# Patient Record
Sex: Male | Born: 1950 | Race: White | Hispanic: No | Marital: Married | Smoking: Never smoker
Health system: Southern US, Community
[De-identification: ages and names within clinical notes are randomized; demographics above are authoritative.]

## PROBLEM LIST (undated history)

## (undated) DIAGNOSIS — K743 Primary biliary cirrhosis: Secondary | ICD-10-CM

## (undated) DIAGNOSIS — E039 Hypothyroidism, unspecified: Secondary | ICD-10-CM

## (undated) DIAGNOSIS — D126 Benign neoplasm of colon, unspecified: Secondary | ICD-10-CM

## (undated) DIAGNOSIS — N281 Cyst of kidney, acquired: Secondary | ICD-10-CM

## (undated) DIAGNOSIS — E785 Hyperlipidemia, unspecified: Secondary | ICD-10-CM

## (undated) DIAGNOSIS — H269 Unspecified cataract: Secondary | ICD-10-CM

## (undated) DIAGNOSIS — C029 Malignant neoplasm of tongue, unspecified: Secondary | ICD-10-CM

## (undated) DIAGNOSIS — D509 Iron deficiency anemia, unspecified: Secondary | ICD-10-CM

## (undated) DIAGNOSIS — M5412 Radiculopathy, cervical region: Secondary | ICD-10-CM

## (undated) DIAGNOSIS — R7402 Elevation of levels of lactic acid dehydrogenase (LDH): Secondary | ICD-10-CM

## (undated) DIAGNOSIS — K7689 Other specified diseases of liver: Secondary | ICD-10-CM

## (undated) DIAGNOSIS — M5136 Other intervertebral disc degeneration, lumbar region: Secondary | ICD-10-CM

## (undated) DIAGNOSIS — D131 Benign neoplasm of stomach: Secondary | ICD-10-CM

## (undated) DIAGNOSIS — N400 Enlarged prostate without lower urinary tract symptoms: Secondary | ICD-10-CM

## (undated) DIAGNOSIS — H9192 Unspecified hearing loss, left ear: Secondary | ICD-10-CM

## (undated) DIAGNOSIS — K219 Gastro-esophageal reflux disease without esophagitis: Secondary | ICD-10-CM

## (undated) DIAGNOSIS — R7401 Elevation of levels of liver transaminase levels: Secondary | ICD-10-CM

## (undated) DIAGNOSIS — R740 Nonspecific elevation of levels of transaminase and lactic acid dehydrogenase [LDH]: Secondary | ICD-10-CM

## (undated) HISTORY — DX: Other specified diseases of liver: K76.89

## (undated) HISTORY — DX: Gastro-esophageal reflux disease without esophagitis: K21.9

## (undated) HISTORY — DX: Elevation of levels of lactic acid dehydrogenase (LDH): R74.02

## (undated) HISTORY — DX: Radiculopathy, cervical region: M54.12

## (undated) HISTORY — PX: BACK SURGERY: SHX140

## (undated) HISTORY — DX: Benign neoplasm of colon, unspecified: D12.6

## (undated) HISTORY — DX: Benign prostatic hyperplasia without lower urinary tract symptoms: N40.0

## (undated) HISTORY — DX: Benign neoplasm of stomach: D13.1

## (undated) HISTORY — DX: Other intervertebral disc degeneration, lumbar region: M51.36

## (undated) HISTORY — DX: Nonspecific elevation of levels of transaminase and lactic acid dehydrogenase (ldh): R74.0

## (undated) HISTORY — DX: Malignant neoplasm of tongue, unspecified: C02.9

## (undated) HISTORY — DX: Unspecified cataract: H26.9

## (undated) HISTORY — DX: Iron deficiency anemia, unspecified: D50.9

## (undated) HISTORY — PX: ESOPHAGOGASTRODUODENOSCOPY: SHX1529

## (undated) HISTORY — DX: Cyst of kidney, acquired: N28.1

## (undated) HISTORY — DX: Hyperlipidemia, unspecified: E78.5

## (undated) HISTORY — PX: OTHER SURGICAL HISTORY: SHX169

## (undated) HISTORY — PX: COLONOSCOPY: SHX174

## (undated) HISTORY — DX: Elevation of levels of liver transaminase levels: R74.01

## (undated) HISTORY — DX: Unspecified hearing loss, left ear: H91.92

## (undated) HISTORY — DX: Primary biliary cirrhosis: K74.3

---

## 2005-10-22 ENCOUNTER — Ambulatory Visit: Payer: Self-pay | Admitting: Cardiovascular Disease

## 2005-10-30 ENCOUNTER — Ambulatory Visit: Payer: Self-pay | Admitting: Internal Medicine

## 2005-12-10 ENCOUNTER — Ambulatory Visit (HOSPITAL_BASED_OUTPATIENT_CLINIC_OR_DEPARTMENT_OTHER): Admission: RE | Admit: 2005-12-10 | Discharge: 2005-12-10 | Payer: Self-pay | Admitting: Orthopedic Surgery

## 2005-12-10 ENCOUNTER — Encounter (INDEPENDENT_AMBULATORY_CARE_PROVIDER_SITE_OTHER): Payer: Self-pay | Admitting: Specialist

## 2006-02-26 ENCOUNTER — Encounter (INDEPENDENT_AMBULATORY_CARE_PROVIDER_SITE_OTHER): Payer: Self-pay | Admitting: *Deleted

## 2006-02-26 ENCOUNTER — Ambulatory Visit: Payer: Self-pay | Admitting: Internal Medicine

## 2006-03-27 ENCOUNTER — Ambulatory Visit: Payer: Self-pay | Admitting: Internal Medicine

## 2006-03-31 ENCOUNTER — Ambulatory Visit: Payer: Self-pay | Admitting: Internal Medicine

## 2006-12-15 ENCOUNTER — Ambulatory Visit: Payer: Self-pay | Admitting: Internal Medicine

## 2006-12-15 LAB — CONVERTED CEMR LAB
ALT: 37 units/L (ref 0–40)
AST: 41 units/L — ABNORMAL HIGH (ref 0–37)
BUN: 19 mg/dL (ref 6–23)
Calcium: 9.8 mg/dL (ref 8.4–10.5)
Chloride: 104 meq/L (ref 96–112)
GFR calc Af Amer: 89 mL/min
HCT: 44.4 % (ref 39.0–52.0)
Hemoglobin: 14.9 g/dL (ref 13.0–17.0)
MCV: 84.7 fL (ref 78.0–100.0)
Potassium: 4.1 meq/L (ref 3.5–5.1)
RDW: 13.6 % (ref 11.5–14.6)
Sodium: 140 meq/L (ref 135–145)
TSH: 1.87 microintl units/mL (ref 0.35–5.50)
VLDL: 23 mg/dL (ref 0–40)
WBC: 6 10*3/uL (ref 4.5–10.5)

## 2008-03-11 ENCOUNTER — Encounter: Payer: Self-pay | Admitting: Internal Medicine

## 2008-03-11 LAB — CONVERTED CEMR LAB
Alkaline Phosphatase: 104 units/L (ref 39–117)
Bilirubin, Direct: 0.1 mg/dL (ref 0.0–0.3)
Eosinophils Absolute: 0.2 10*3/uL (ref 0.0–0.7)
GFR calc Af Amer: 88 mL/min
GFR calc non Af Amer: 73 mL/min
HCT: 41 % (ref 39.0–52.0)
MCV: 84.3 fL (ref 78.0–100.0)
Monocytes Absolute: 0.5 10*3/uL (ref 0.1–1.0)
Monocytes Relative: 9.6 % (ref 3.0–12.0)
Platelets: 232 10*3/uL (ref 150–400)
Potassium: 4.2 meq/L (ref 3.5–5.1)
RDW: 13.6 % (ref 11.5–14.6)
Sodium: 141 meq/L (ref 135–145)

## 2008-11-16 ENCOUNTER — Ambulatory Visit: Payer: Self-pay | Admitting: Internal Medicine

## 2008-11-16 LAB — CONVERTED CEMR LAB
AST: 79 units/L — ABNORMAL HIGH (ref 0–37)
Basophils Absolute: 0 10*3/uL (ref 0.0–0.1)
Basophils Relative: 0.1 % (ref 0.0–3.0)
Chloride: 105 meq/L (ref 96–112)
Cholesterol: 134 mg/dL (ref 0–200)
Creatinine, Ser: 0.9 mg/dL (ref 0.4–1.5)
Eosinophils Absolute: 0.5 10*3/uL (ref 0.0–0.7)
GFR calc Af Amer: 111 mL/min
GFR calc non Af Amer: 92 mL/min
Ketones, ur: NEGATIVE mg/dL
LDL Cholesterol: 80 mg/dL (ref 0–99)
MCHC: 34.9 g/dL (ref 30.0–36.0)
MCV: 86.8 fL (ref 78.0–100.0)
Monocytes Absolute: 0.7 10*3/uL (ref 0.1–1.0)
Neutrophils Relative %: 64.9 % (ref 43.0–77.0)
PSA: 0.47 ng/mL (ref 0.10–4.00)
Platelets: 185 10*3/uL (ref 150–400)
RBC: 4.7 M/uL (ref 4.22–5.81)
TSH: 2.01 microintl units/mL (ref 0.35–5.50)
Total Bilirubin: 0.6 mg/dL (ref 0.3–1.2)
Triglycerides: 66 mg/dL (ref 0–149)
Urine Glucose: NEGATIVE mg/dL
Urobilinogen, UA: 0.2 (ref 0.0–1.0)
VLDL: 13 mg/dL (ref 0–40)

## 2008-11-17 DIAGNOSIS — M5412 Radiculopathy, cervical region: Secondary | ICD-10-CM | POA: Insufficient documentation

## 2008-11-17 DIAGNOSIS — R740 Nonspecific elevation of levels of transaminase and lactic acid dehydrogenase [LDH]: Secondary | ICD-10-CM

## 2008-11-17 DIAGNOSIS — K219 Gastro-esophageal reflux disease without esophagitis: Secondary | ICD-10-CM

## 2008-11-17 DIAGNOSIS — E785 Hyperlipidemia, unspecified: Secondary | ICD-10-CM | POA: Insufficient documentation

## 2008-11-17 DIAGNOSIS — R4681 Obsessive-compulsive behavior: Secondary | ICD-10-CM | POA: Insufficient documentation

## 2008-11-17 DIAGNOSIS — M47816 Spondylosis without myelopathy or radiculopathy, lumbar region: Secondary | ICD-10-CM | POA: Insufficient documentation

## 2008-11-17 DIAGNOSIS — K602 Anal fissure, unspecified: Secondary | ICD-10-CM

## 2008-11-17 DIAGNOSIS — D509 Iron deficiency anemia, unspecified: Secondary | ICD-10-CM

## 2008-11-17 DIAGNOSIS — E063 Autoimmune thyroiditis: Secondary | ICD-10-CM

## 2009-02-07 ENCOUNTER — Telehealth: Payer: Self-pay | Admitting: Internal Medicine

## 2010-04-24 ENCOUNTER — Telehealth: Payer: Self-pay | Admitting: Adult Health

## 2010-09-06 ENCOUNTER — Telehealth: Payer: Self-pay | Admitting: Internal Medicine

## 2010-11-05 ENCOUNTER — Telehealth: Payer: Self-pay | Admitting: Internal Medicine

## 2010-12-17 ENCOUNTER — Telehealth: Payer: Self-pay | Admitting: Internal Medicine

## 2010-12-18 NOTE — Progress Notes (Signed)
  Phone Note Refill Request Message from:  Fax from Pharmacy on September 06, 2010 3:09 PM  Refills Requested: Medication #1:  SONATA 10 MG  CAPS 1 by mouth at bedtime as needed Please Advise refill he [facility] [address]  Initial call taken by: Ami Bullins [name],  September 06, 2010 3:09 PM  Follow-up for Phone Call        ok for # 30 with 5 refills Follow-up by: Jacques Navy MD,  September 06, 2010 3:57 PM    Prescriptions: SONATA 10 MG  CAPS (ZALEPLON) 1 by mouth at bedtime as needed  #30 x 5   Entered by:   Ami Bullins [name]   Authorized by:   Jacques Navy MD   Signed by:   Bill Salinas [name] on 09/06/2010   Method used:   Telephoned to ...       [facility] [phone]* (retail)       [address] [place], [place]  [address]       Ph: [phone] or [phone]       Fax: [phone]   RxID:   [id number]

## 2010-12-18 NOTE — Progress Notes (Signed)
  Phone Note Call from Patient   Action Taken: Rx Called In Details for Reason: Yellow jacket sting Summary of Call: Patient called on June 5th after having 5 yellow jacket stings to his foot.  He has a  history of infected stings and inflammation requiring antibiotics and prednisone.  Keflex and prednisone called in to [facility]. Initial call taken by: Joni Reining NP

## 2010-12-20 NOTE — Progress Notes (Signed)
       New/Updated Medications: SERTRALINE HCL 50 MG TABS (SERTRALINE HCL) 1 tablet daily, with 1/2 as needed LIPITOR 10 MG TABS (ATORVASTATIN CALCIUM) 1 tablet daily Prescriptions: LIPITOR 10 MG TABS (ATORVASTATIN CALCIUM) 1 tablet daily  #90 x 3   Entered by:   Ami Bullins [name]   Authorized by:   Jacques Navy MD   Signed by:   Bill Salinas [name] on 11/05/2010   Method used:   Electronically to        [facility] [phone]* (retail)       [address] [place], [place]  [address]       Ph: [phone] or [phone]       Fax: [phone]   RxID:   [id number] SYNTHROID 112 MCG TABS (LEVOTHYROXINE SODIUM) Take 1 tablet by mouth once a day Brand medically necessary #90 x 3   Entered by:   Ami Bullins [name]   Authorized by:   Jacques Navy MD   Signed by:   Bill Salinas [name] on 11/05/2010   Method used:   Electronically to        [facility] [phone]* (retail)       [address] [place], [place]  [address]       Ph: [phone] or [phone]       Fax: [phone]   RxID:   [id number] SERTRALINE HCL 50 MG TABS (SERTRALINE HCL) 1 tablet daily, with 1/2 as needed  #120 x 3   Entered by:   Ami Bullins [name]   Authorized by:   Jacques Navy MD   Signed by:   Bill Salinas [name] on 11/05/2010   Method used:   Electronically to        [facility] [phone]* (retail)       [address] [place], [place]  [address]       Ph: [phone] or [phone]       Fax: [phone]   RxID:   [id number]

## 2010-12-26 NOTE — Progress Notes (Signed)
Summary: REQ FOR RX  Phone Note Call from Patient Call back at [phone] OR hm #   Summary of Call: Pt req rx for tussionex to help w/cough at night. [facility] [address] Initial call taken by: Lamar Sprinkles, [name],  December 17, 2010 10:37 AM  Follow-up for Phone Call        Dr Debby Bud okd Phenergan with codeine cough syrup 8 oz 1 teaspoon q 6 hours as needed  Follow-up by: Ami Bullins [name],  December 17, 2010 1:06 PM    New/Updated Medications: PROMETHAZINE-CODEINE 6.25-10 MG/5ML SYRP (PROMETHAZINE-CODEINE) 1 teaspoon q 6 hours as needed Prescriptions: PROMETHAZINE-CODEINE 6.25-10 MG/5ML SYRP (PROMETHAZINE-CODEINE) 1 teaspoon q 6 hours as needed  #8 oz x 1   Entered by:   Ami Bullins [name]   Authorized by:   Jacques Navy MD   Signed by:   Bill Salinas [name] on 12/17/2010   Method used:   Telephoned to ...       [facility] [phone]* (retail)       [address] [place], [place]  [address]       Ph: [phone] or [phone]       Fax: [phone]   RxID:   [id number]

## 2011-01-21 ENCOUNTER — Telehealth: Payer: Self-pay | Admitting: Internal Medicine

## 2011-01-23 ENCOUNTER — Encounter: Payer: Self-pay | Admitting: Internal Medicine

## 2011-01-29 NOTE — Miscellaneous (Signed)
Summary: Flagyl Rx  Per Dr Lina Sar, her and Dr Myrtis Ser have spoken. She would like him to have Flagyl 250 mg 1 tablet by mouth three times a day #15. Will send prescription to [facility]. Dottie Nelson-Smith [name] Duncan Dull)  January 23, 2011 11:34 AM  Clinical Lists Changes  Medications: Added new medication of FLAGYL 250 MG TABS (METRONIDAZOLE) Take 1 tablet by mouth three times a day x 5 days - Signed Rx of FLAGYL 250 MG TABS (METRONIDAZOLE) Take 1 tablet by mouth three times a day x 5 days;  #15 x 0;  Signed;  Entered by: Lamona Curl [name] (AAMA);  Authorized by: Hart Carwin MD;  Method used: Electronically to [facility]*, [address], [address] Shipping/mailing, [place], [place]  [address], Ph: [phone], Fax: [phone]    Prescriptions: FLAGYL 250 MG TABS (METRONIDAZOLE) Take 1 tablet by mouth three times a day x 5 days  #15 x 0   Entered by:   Lamona Curl [name] (AAMA)   Authorized by:   Hart Carwin MD   Signed by:   Lamona Curl [name] (AAMA) on 01/23/2011   Method used:   Electronically to        [facility]* (retail)       [address]       [address] Shipping/mailing       [place], [place]  [address]       Ph: [phone]       Fax: [phone]   RxID:   [id number]

## 2011-01-29 NOTE — Progress Notes (Signed)
Summary: NEW RX  Phone Note Call from Patient   Summary of Call: Pt spoke w/MD. Pt needs 2 rx's.  Initial call taken by: Lamar Sprinkles, [name],  January 21, 2011 4:03 PM  Follow-up for Phone Call        yepl thanks Follow-up by: Jacques Navy MD,  January 21, 2011 5:56 PM    New/Updated Medications: ANUSOL-HC 2.5 % CREA (HYDROCORTISONE) use as needed ANUSOL-HC 25 MG SUPP (HYDROCORTISONE ACETATE) use two times a day as directed Prescriptions: ANUSOL-HC 25 MG SUPP (HYDROCORTISONE ACETATE) use two times a day as directed  #14 x 1   Entered by:   Lamar Sprinkles, [name]   Authorized by:   Jacques Navy MD   Signed by:   Lamar Sprinkles, [name] on 01/21/2011   Method used:   Electronically to        [facility] [phone]* (retail)       [address] [place], [place]  [address]       Ph: [phone] or [phone]       Fax: [phone]   RxID:   [id number] ANUSOL-HC 2.5 % CREA (HYDROCORTISONE) use as needed  #30 x 1   Entered by:   Lamar Sprinkles, [name]   Authorized by:   Jacques Navy MD   Signed by:   Lamar Sprinkles, [name] on 01/21/2011   Method used:   Electronically to        [facility] [phone]* (retail)       [address] [place], [place]  [address]       Ph: [phone] or [phone]       Fax: [phone]   RxID:   [id number]

## 2011-04-05 NOTE — Op Note (Signed)
NAMEJOSUHA, FONTANEZ                ACCOUNT NO.:  [id number]   MEDICAL RECORD NO.:  [id number]          PATIENT TYPE:  AMB   LOCATION:  DSC                          FACILITY:  [facility]   PHYSICIAN:  Katy Fitch. Sypher, M.D. 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   POSTOP DIAGNOSIS:  Verrucous lesion of ventral nail fold.   OPERATION:  Nail plate removal and full-thickness skin biopsy of left index  nail fold with curettage of the ventral nail fold and viral culture.   OPERATING SURGEON:  Josephine Igo, M.D.   ASSISTANT:  Molly Maduro Dasnoit PA-C   ANESTHESIA:  2% lidocaine field block and metacarpal head level block of  right index finger without sedation. This was performed in the minor  operating room at the [facility].   INDICATIONS:  Raymar Joiner is a 60 year old gentleman who presented for  evaluation of chronic periungual and subungual verrucous lesion.   He had been seen by Dr. Hortense Ramal, a dermatologist and had tried a number of  treatments including 5-FU application. He had a significant predicament with  lysis of the nail plate from the ventral nail fold and a substantial  verrucous lesion measuring approximately 12 mm across and 10 mm in length.   We recommended nail plate removal, biopsy and viral culture.   After informed consent, Dr. Myrtis Ser is brought to the minor operating room at  this time.   PROCEDURE:  Travell Desaulniers is brought to the operating room and placed supine  position on the table. Following placement of metacarpal head level block,  satisfactory anesthesia of the right index finger was achieved.   The arm was prepped with Betadine soap solution, sterilely draped. The nail  plate was removed without difficulty with a Therapist, nutritional.   The lesion was carefully debrided  with a curette and rongeur removing 80% of  the bulk of the verrucous lesion. This passed off for a biopsy in a sterile  cup to be processed by the lab and a small portion will be utilized for a  viral culture.   2% lidocaine was then infiltrated deep to the lesion in the nail fold an  effort provoked an immune reaction to the verrucous lesion.   The nail plate was not replaced. The wound was dressed with Xeroflo, sterile  gauze and Coban. There no apparent complications.. Dr. Myrtis Ser was provided  additional Coban to change his dressing at home. He was cautioned not to  wrap it tightly.   He will return for follow-up evaluation in one week or sooner as needed for  problems.      Katy Fitch Sypher, M.D.  Electronically Signed     RVS/MEDQ  D:  12/10/2005  T:  12/11/2005  Job:  [id number]

## 2011-08-19 DIAGNOSIS — M5136 Other intervertebral disc degeneration, lumbar region: Secondary | ICD-10-CM

## 2011-08-19 DIAGNOSIS — M51369 Other intervertebral disc degeneration, lumbar region without mention of lumbar back pain or lower extremity pain: Secondary | ICD-10-CM

## 2011-08-19 HISTORY — DX: Other intervertebral disc degeneration, lumbar region: M51.36

## 2011-08-19 HISTORY — DX: Other intervertebral disc degeneration, lumbar region without mention of lumbar back pain or lower extremity pain: M51.369

## 2011-09-05 ENCOUNTER — Encounter: Payer: Self-pay | Admitting: Physician Assistant

## 2011-09-05 DIAGNOSIS — M549 Dorsalgia, unspecified: Secondary | ICD-10-CM

## 2011-09-05 MED ORDER — PREDNISONE 10 MG PO TABS: ORAL_TABLET | ORAL | Status: DC

## 2011-09-05 NOTE — Progress Notes (Signed)
HPI:  William Luna is a 60 y.o. male who complains of low back pain for the past week.  He was carrying some heavy items at [facility] and noted left sided discomfort.  It is worse with certain positions.  It is better in other positions.  Certain right leg movements exacerbate pain.  No loss of bowel or bladder function.  No weakness.  Course of NSAIDs was not very successful.  He has exacerbations of low back pain from time to time.     PHYSICAL EXAM: Well nourished, well developed, in no acute distress HEENT: normal MSK:  BLE strength is normal and equal Ext: no edema Skin: warm and dry Neuro:  CNs 2-12 intact, no focal abnormalities noted; patellar and achilles DTRs 2+ bilaterally   ASSESSMENT AND PLAN:  1.  Low Back Pain - Suspect soft tissue injury.  Has some radiation of pain to buttocks but no clear radicular pattern.  No weakness on exam.  Course of NSAIDs unsuccessful.  Will try prednisone taper.  If no resolution, will likely need imaging of back.  Recommend trial of PT once acute injury resolved as he has occasional episodes of back pain.  He will consider this.

## 2011-09-12 ENCOUNTER — Ambulatory Visit
Admission: RE | Admit: 2011-09-12 | Discharge: 2011-09-12 | Disposition: A | Discharge status: Home / Self Care | Payer: Commercial Managed Care - PPO | Source: Ambulatory Visit | Attending: Neurology | Admitting: Neurology

## 2011-09-12 ENCOUNTER — Other Ambulatory Visit: Payer: Self-pay | Admitting: Neurology

## 2011-09-12 DIAGNOSIS — M545 Low back pain: Secondary | ICD-10-CM

## 2011-09-12 MED ORDER — IOHEXOL 180 MG/ML  SOLN
2.0000 mL | Freq: Once | INTRAMUSCULAR | Status: AC | PRN
  Administered 2011-09-12: 2 mL via EPIDURAL

## 2011-09-12 MED ORDER — METHYLPREDNISOLONE ACETATE 40 MG/ML INJ SUSP (RADIOLOG
120.0000 mg | Freq: Once | INTRAMUSCULAR | Status: AC
  Administered 2011-09-12: 120 mg via EPIDURAL

## 2011-09-12 NOTE — Patient Instructions (Signed)

## 2011-09-24 ENCOUNTER — Other Ambulatory Visit: Payer: Self-pay | Admitting: *Deleted

## 2011-09-24 MED ORDER — SERTRALINE HCL 50 MG PO TABS: 50.0000 mg | ORAL_TABLET | Freq: Every day | ORAL | Status: DC

## 2011-09-25 ENCOUNTER — Other Ambulatory Visit: Payer: Self-pay | Admitting: *Deleted

## 2011-09-25 MED ORDER — SERTRALINE HCL 50 MG PO TABS: 50.0000 mg | ORAL_TABLET | Freq: Every day | ORAL | Status: DC

## 2011-10-17 ENCOUNTER — Other Ambulatory Visit: Payer: Self-pay | Admitting: Oral Surgery

## 2011-10-23 ENCOUNTER — Other Ambulatory Visit: Payer: Self-pay | Admitting: Otolaryngology

## 2011-10-23 DIAGNOSIS — C801 Malignant (primary) neoplasm, unspecified: Secondary | ICD-10-CM

## 2011-10-24 ENCOUNTER — Ambulatory Visit
Admission: RE | Admit: 2011-10-24 | Discharge: 2011-10-24 | Disposition: A | Discharge status: Home / Self Care | Payer: 59 | Source: Ambulatory Visit | Attending: Otolaryngology | Admitting: Otolaryngology

## 2011-10-24 ENCOUNTER — Encounter (HOSPITAL_COMMUNITY): Payer: Self-pay

## 2011-10-24 ENCOUNTER — Other Ambulatory Visit (HOSPITAL_COMMUNITY): Payer: Self-pay | Admitting: Otolaryngology

## 2011-10-24 ENCOUNTER — Encounter (HOSPITAL_COMMUNITY)
Admission: RE | Admit: 2011-10-24 | Discharge: 2011-10-24 | Disposition: A | Discharge status: Home / Self Care | Payer: 59 | Source: Ambulatory Visit | Attending: Otolaryngology | Admitting: Otolaryngology

## 2011-10-24 DIAGNOSIS — C801 Malignant (primary) neoplasm, unspecified: Secondary | ICD-10-CM

## 2011-10-24 HISTORY — DX: Hypothyroidism, unspecified: E03.9

## 2011-10-24 LAB — BASIC METABOLIC PANEL
BUN: 19 mg/dL (ref 6–23)
CO2: 29 mEq/L (ref 19–32)
Calcium: 9.8 mg/dL (ref 8.4–10.5)
GFR calc non Af Amer: 72 mL/min — ABNORMAL LOW (ref >90)
Glucose, Bld: 92 mg/dL (ref 70–99)

## 2011-10-24 LAB — CBC
HCT: 44.8 % (ref 39.0–52.0)
Hemoglobin: 14.8 g/dL (ref 13.0–17.0)
MCH: 29 pg (ref 26.0–34.0)
MCHC: 33 g/dL (ref 30.0–36.0)
MCV: 87.7 fL (ref 78.0–100.0)

## 2011-10-24 MED ORDER — IOHEXOL 300 MG/ML  SOLN
75.0000 mL | Freq: Once | INTRAMUSCULAR | Status: AC | PRN
  Administered 2011-10-24: 75 mL via INTRAVENOUS

## 2011-10-24 NOTE — Pre-Procedure Instructions (Signed)
[name]  10/24/2011   Your procedure is scheduled on:  10/30/11  Report to [facility] at 945 AM.  Call this number if you have problems the morning of surgery: [phone]   Remember:   Do not eat food:After Midnight.  May have clear liquids: up to 4 Hours before arrival.  Clear liquids include soda, tea, black coffee, apple or grape juice, broth.  Take these medicines the morning of surgery with A SIP OF WATER: SYNTHROID,ZOLOFT   Do not wear jewelry, make-up or nail polish.  Do not wear lotions, powders, or perfumes. You may wear deodorant.  Do not shave 48 hours prior to surgery.  Do not bring valuables to the hospital.  Contacts, dentures or bridgework may not be worn into surgery.  Leave suitcase in the car. After surgery it may be brought to your room.  For patients admitted to the hospital, checkout time is 11:00 AM the day of discharge.   Patients discharged the day of surgery will not be allowed to drive home.  Name and phone number of your driver: FAMILY  Special Instructions: CHG Shower Use Special Wash: 1/2 bottle night before surgery and 1/2 bottle morning of surgery.   Please read over the following fact sheets that you were given: MRSA Information and Surgical Site Infection Prevention

## 2011-10-25 ENCOUNTER — Telehealth: Payer: Self-pay | Admitting: Oncology

## 2011-10-25 ENCOUNTER — Other Ambulatory Visit: Payer: Self-pay | Admitting: Oncology

## 2011-10-25 ENCOUNTER — Other Ambulatory Visit: Payer: Self-pay | Admitting: Internal Medicine

## 2011-10-25 ENCOUNTER — Other Ambulatory Visit (HOSPITAL_COMMUNITY): Payer: 59

## 2011-10-25 DIAGNOSIS — R591 Generalized enlarged lymph nodes: Secondary | ICD-10-CM

## 2011-10-25 DIAGNOSIS — C01 Malignant neoplasm of base of tongue: Secondary | ICD-10-CM

## 2011-10-25 NOTE — Telephone Encounter (Signed)
Added new pt appt for 12/11 @ 4:15 pm for lb and 4:45 pm for [facility] Pt has already been contacted by Amy M. Re both appts. Pt contacted by Amy per GM's request.

## 2011-10-28 ENCOUNTER — Encounter (HOSPITAL_COMMUNITY)
Admission: RE | Admit: 2011-10-28 | Discharge: 2011-10-28 | Disposition: A | Discharge status: Home / Self Care | Payer: 59 | Source: Ambulatory Visit | Attending: Oncology | Admitting: Oncology

## 2011-10-28 ENCOUNTER — Encounter: Payer: Self-pay | Admitting: Oncology

## 2011-10-28 ENCOUNTER — Encounter (HOSPITAL_COMMUNITY)
Admission: RE | Admit: 2011-10-28 | Discharge: 2011-10-28 | Disposition: A | Discharge status: Home / Self Care | Payer: 59 | Source: Ambulatory Visit | Attending: Internal Medicine | Admitting: Internal Medicine

## 2011-10-28 ENCOUNTER — Telehealth: Payer: Self-pay | Admitting: Oncology

## 2011-10-28 DIAGNOSIS — N200 Calculus of kidney: Secondary | ICD-10-CM | POA: Insufficient documentation

## 2011-10-28 DIAGNOSIS — N281 Cyst of kidney, acquired: Secondary | ICD-10-CM | POA: Insufficient documentation

## 2011-10-28 DIAGNOSIS — R591 Generalized enlarged lymph nodes: Secondary | ICD-10-CM

## 2011-10-28 DIAGNOSIS — C021 Malignant neoplasm of border of tongue: Secondary | ICD-10-CM | POA: Insufficient documentation

## 2011-10-28 DIAGNOSIS — K7689 Other specified diseases of liver: Secondary | ICD-10-CM | POA: Insufficient documentation

## 2011-10-28 DIAGNOSIS — C01 Malignant neoplasm of base of tongue: Secondary | ICD-10-CM

## 2011-10-28 MED ORDER — FLUDEOXYGLUCOSE F - 18 (FDG) INJECTION: 18.3000 | Freq: Once | INTRAVENOUS | Status: DC | PRN

## 2011-10-28 NOTE — Telephone Encounter (Signed)
CHART DELIVERED °

## 2011-10-29 ENCOUNTER — Ambulatory Visit (HOSPITAL_BASED_OUTPATIENT_CLINIC_OR_DEPARTMENT_OTHER): Payer: 59

## 2011-10-29 ENCOUNTER — Ambulatory Visit (HOSPITAL_BASED_OUTPATIENT_CLINIC_OR_DEPARTMENT_OTHER): Payer: 59 | Admitting: Oncology

## 2011-10-29 ENCOUNTER — Other Ambulatory Visit: Payer: 59 | Admitting: Lab

## 2011-10-29 ENCOUNTER — Encounter: Payer: Self-pay | Admitting: Oncology

## 2011-10-29 VITALS — BP 131/75 | HR 66 | Temp 97.0°F | Ht 68.0 in | Wt 162.4 lb

## 2011-10-29 DIAGNOSIS — C029 Malignant neoplasm of tongue, unspecified: Secondary | ICD-10-CM

## 2011-10-29 NOTE — Progress Notes (Signed)
[facility] - INITIAL CONSULATION  Referral MD:  [facility], M.D.   Reason for Referral: newly diagnosed right lateral tongue squamous cell carcinoma.    HPI:  [facility] Luna is a 60 yo man with no history of smoking or heavy EtOH use.  He was in usual state of health until October-November 2012 when he noticed a tingling sensation when he swallowed in the right lateral tongue.  At first, he could not palpate any mass.  However, by mid November 2012, he could feel a small 3-84mm mass in the area.  He presented to an oral surgeon [facility] who performed a "shave biopsy" per [facility] description on 10/17/2011.  Pathology case # [id number] was notable for squamous cell carcinoma, most likely invasive; positive margin.  He was referred to [facility] Luna who recommended primary resection.  [facility] Luna was kindly referred to the [facility] for medical oncology evaluation.  He presented to the clinic for the first time today with his wife.  He still has similar burning sensation in the right lateral tongue when he swallows.  After the biopsy, he no longer could feel any mass.  He denies any palpable bilateral neck nodes.  He denies recent sinus infection, dental abscess.    Patient denies fatigue, headache, visual changes, confusion, drenching night sweats, odynophagia, dysphagia, nausea vomiting, jaundice, chest pain, palpitation, shortness of breath, dyspnea on exertion, productive cough, gum bleeding, epistaxis, hematemesis, hemoptysis, abdominal pain, abdominal swelling, early satiety, melena, hematochezia, hematuria, skin rash, spontaneous bleeding, heat or cold intolerance, bowel bladder incontinence, back pain, focal motor weakness, paresthesia, depression, suicidal or homocidal ideation, feeling hopelessness.  He has left hip pain from osteoarthritis which required steroid injection recently.  He had mild left leg weakness due to the pain; which has improved almost to normal after the  recent injection.      Past Medical History  Diagnosis Date  . Hypothyroidism   . Anxiety   . HYPERLIPIDEMIA, MILD 11/17/2008  . ANEMIA, IRON DEFICIENCY 11/17/2008  . GERD 11/17/2008  . 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 CERVICAL RADICULOPATHY 11/17/2008  . TRANSAMINASES, SERUM, ELEVATED 11/17/2008  :  Past Surgical History  Procedure Date  . Back surgery     CERVICAL DISC   Left ear hearing loss, age related.    Current outpatient prescriptions:aspirin EC 81 MG tablet, Take 81 mg by mouth daily.  , Disp: , Rfl: ;  atorvastatin (LIPITOR) 10 MG tablet, Take 10 mg by mouth daily.  , Disp: , Rfl: ;  levothyroxine (SYNTHROID, LEVOTHROID) 112 MCG tablet, Take 112 mcg by mouth daily.  , Disp: , Rfl: ;  sertraline (ZOLOFT) 50 MG tablet, Take 50-75 mg by mouth daily. Pt alternates between 50 mg (1 tab) one day and 75 mg (1 1/2 tabs) the next day , Disp: , Rfl:  predniSONE (DELTASONE) 10 MG tablet, Take 6 tabs for 3 days, then 4 tabs for 4 days, then 2 tabs for 4 days, then 1 tab for 3 days, then stop, Disp: 45 tablet, Rfl: 0 No current facility-administered medications for this visit. Facility-Administered Medications Ordered in Other Visits: fludeoxyglucose F - 18 (FDG) injection 18.3 milli Curie, 18.3 milli Curie, Intravenous, Once PRN, Medication Radiologist:    :  No Known Allergies:  History reviewed. No pertinent family history.:  History   Social History  . Marital Status: Married    Spouse Name: N/A    Number of Children: 2  . 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 Smoking status: Never Smoker   . Smokeless tobacco: Never Used  . Alcohol Use: Yes     OCC.  . Drug Use: No  . Sexually Active: Yes    Birth Control/ Protection: None   Other Topics Concern  . Not on file   Social History Narrative  . No narrative on file    Pertinent items are noted in  HPI.  Exam:  General:  well-nourished in no acute distress.  Eyes:  no scleral icterus.  ENT:  There were no oropharyngeal lesions.  I could visualize a white scar from recent biopsy of the right lateral tongue.  However, I could not palpate any tongue mass.  Neck was without thyromegaly.  Lymphatics:  Negative cervical, supraclavicular or axillary adenopathy.  Respiratory: lungs were clear bilaterally without wheezing or crackles.  Cardiovascular:  Regular rate and rhythm, S1/S2, without murmur, rub or gallop.  There was no pedal edema.  GI:  abdomen was soft, flat, nontender, nondistended, without organomegaly.  Muscoloskeletal:  no spinal tenderness of palpation of vertebral spine.  Skin exam was without echymosis, petichae.  Neuro exam was nonfocal.  Patient was able to get on and off exam table without assistance.  Gait was normal.  Patient was alerted and oriented.  Attention was good.   Language was appropriate.  Mood was normal without depression.  Speech was not pressured.  Thought content was not tangential.     LABS:  Recent CBC; CMET reviewed and were within normal range.   PATHOLOGY:  From right lateral tongue border; biopsy 10/17/2011:  Invasive squamous cell carcinoma; positive margin.   IMAGING:  I personally reviewed the following CT and PET scan and showed the pictures to [facility] and his wife.  In brief, there was no tongue mass.  On CT, there was 2 shotty right cervical levels II/III nodes about 14 mm.  There was a smaller node in the left cervical neck.   These nodes were not active on PET scan.   There was no evidence of metastatic disease on PET.   Ct Soft Tissue Neck W Contrast  10/24/2011  *RADIOLOGY REPORT*  Clinical Data: New diagnosis of tongue cancer along the right lateral border.  Squamous cell carcinoma.  199.1.  CT NECK WITH CONTRAST  Technique:  Multidetector CT imaging of the neck was performed with intravenous contrast.  Contrast: 75mL OMNIPAQUE IOHEXOL 300 MG/ML IV  SOLN  Comparison: None.  Findings: The primary site of tumor may be obscured by adjacent dental artifact.  No discrete lesion is evident.  The intrinsic tongue muscles are intact.  Fat planes are preserved.  There is no evidence for deep invasion.  A right level II lymph node is somewhat rounded, measuring 14 x 9 mm.  An adjacent node measures 15 x 9 mm.  A left level II lymph node measures 14 mm, but is more ovoid.  There is a small node near the carotid bifurcation on the left as well.  No other pathologically enlarged nodes are evident.  Lung apices are clear.  The bone windows reveal anterior cervical fusion at C5-6 and C6-7.  The posterior elements are fused as well. Uncovertebral spurring is present C3-4 C4-5 with left greater than right to osseous foraminal stenosis.  IMPRESSION:  1.  The primary tongue lesion is not visualized.  It may be obscured by dental artifact.  There is no evidence for deep invasion of the intrinsic tongue muscles. 2.  There are two enlarged right level II cervical lymph nodes which are somewhat worrisome for potential metastases.  More benign- appearing nodes are present on the left. 3.  Postsurgical changes of the cervical spine. 4.  Mild spondylosis at C3-4 and C4-5 as described.  Original Report Authenticated By: Jamesetta Orleans. MATTERN, M.D.   Nm Pet Image Initial (pi) Skull Base To Thigh  10/28/2011  *RADIOLOGY REPORT*  Clinical Data: Initial treatment strategy for head neck cancer. Lateral tongue carcinoma.  NUCLEAR MEDICINE PET SKULL BASE TO THIGH  Fasting Blood Glucose:  82  Technique:  18.3 mCi F-18 FDG was injected intravenously.   CT data was obtained and used for attenuation correction and anatomic localization only.  (This was not acquired as a diagnostic CT examination.) Additional exam technical data entered  on technologist worksheet.  Right arm injection.  Comparison: Head CT 10/24/2011  Findings:  Neck: There is no asymmetric activity within the tongue to  localize the primary carcinoma.  The right  lateral neck level II lymph nodes of concern on prior CT measure 6 mm short axis and 7 mm short axis (images 38 and 34 respectively)  on dedicated neck series (series 2).  These nodes have low metabolic activity ( SUV max = 2.5) which is similar to background blood pool activity.  This is felt to represent inflammatory or reactive activity rather than neoplastic.  No additional hypermetabolic nodes in the neck.  Chest: No hypermetabolic mediastinal or hilar nodes.  No suspicious pulmonary nodules.  A low density lesion in the liver has  simple fluid attenuation and no associated metabolic activity.  There is a tiny 2 mm calculus within the right kidney which is nonobstructive. Low density cyst within the left kidney is simple fluid attenuation.  Abdomen / Pelvis:No abnormal hypermetabolic activity within the solid organs.  No evidence of abdominal or pelvic hypermetabolic nodes.  Skeleton:No focal hypermetabolic activity to suggest skeletal metastasis.  IMPRESSION:  1.  No evidence of local head neck cancer nodal metastasis.  2.   Level II lymph nodes on the right have low metabolic activity which is felt to be reactive. 3.  No evidence  of  distal metastasis. 4.  Incidental finding including the hepatic cyst, renal cyst, and small right renal calculus.  Findings discussed  [facility] on 10/28/2011 at 1445 hours  Original Report Authenticated By: [name], M.D.    Assessment and Plan:  cT1 Nx M0 right lateral tongue SCC.  I discussed the following plan with [facility] over the phone today.   I discussed with [facility] and his wife that there is no way to no for sure short of neck dissection to see if he has nodal involvement if the neck.  The CT of the neck did show some shotty right more than left cervical adenopathy.  PET scan did not show uptake of these nodes; however, there is a limit of detection when the nodes are still small.  There are two options.  The aggressive option is to assume that the nodes are positive.  In this case, the curative option is primary oncologic resection with wide margin of the right lateral tongue mass along with right neck dissection.  If he has high risk features such as positive margin; or if the nodes are indeed positive and they have extracapsular extension, then he would benefit from adjuvant chemoradiation to improve local/regional control and  ultimately disease-free survival and overall survival.  If the nodes are positive without high risk features, then he would only need adjuvant radiation.  [facility] verbally expressed his understanding of the low but potential side effects of neck dissection which were discussed to him by [facility] Luna which include but not limited to selective cranial nerve weakness, scar formation, neck stiffness.   The conservative option is to only perform primary oncologic resection and skip the neck dissection for now.  He then would undergo watchful observation with follow up neck CT in the near future within 3-6 months.  If there is cervical node enlargement either clinically or radiographicaly, then, neck dissection will be considered at that time.  The advantage of this is to avoid the potential side effects of neck dissection.  The potential draw back is the low chance of distant met at the same time the cervical nodes clinically declares itself.   At this time, [facility] explicitly verbalized his opinion that "he would like to do the aggressive measure(s) that is/are within guideline recommendations."  I showed him and his wife NCCN guideline for oral SCC.  If we assume that he has cT1 N0 M0 disease, the recommendation for neck dissection is +/-.  This means that the strength of the recommendation for neck dissection is not as strong compared to cases with clinically cervical node positive; but it would still be reasonable to do so.   William Luna would like to think this over tonight and decide with  [facility] Luna tomorrow preoperatively.  His final path will be presented at the next head/neck tumor board on 11/06/11.  He was not given follow up appointment with me unless he turns out to require adjuvant chemotherapy.  I advised to follow up with [facility] Luna closely especially if he decides to follow the cervical nodes clinically instead of upfront neck dissection.  The length of time of the face-to-face encounter was 60  minutes. More than 50% of time was spent counseling and coordination of care.

## 2011-10-30 ENCOUNTER — Ambulatory Visit (HOSPITAL_COMMUNITY)
Admission: RE | Admit: 2011-10-30 | Discharge: 2011-10-31 | Disposition: A | Discharge status: Home / Self Care | Payer: 59 | Source: Ambulatory Visit | Attending: Otolaryngology | Admitting: Otolaryngology

## 2011-10-30 ENCOUNTER — Ambulatory Visit (HOSPITAL_COMMUNITY): Payer: 59 | Admitting: Anesthesiology

## 2011-10-30 ENCOUNTER — Other Ambulatory Visit: Payer: Self-pay | Admitting: Otolaryngology

## 2011-10-30 ENCOUNTER — Other Ambulatory Visit: Payer: Self-pay

## 2011-10-30 ENCOUNTER — Encounter (HOSPITAL_COMMUNITY): Payer: Self-pay | Admitting: Anesthesiology

## 2011-10-30 ENCOUNTER — Encounter (HOSPITAL_COMMUNITY)
Admission: RE | Disposition: A | Discharge status: Home / Self Care | Payer: Self-pay | Source: Ambulatory Visit | Attending: Otolaryngology

## 2011-10-30 DIAGNOSIS — Z01812 Encounter for preprocedural laboratory examination: Secondary | ICD-10-CM | POA: Insufficient documentation

## 2011-10-30 DIAGNOSIS — K219 Gastro-esophageal reflux disease without esophagitis: Secondary | ICD-10-CM | POA: Insufficient documentation

## 2011-10-30 DIAGNOSIS — E039 Hypothyroidism, unspecified: Secondary | ICD-10-CM | POA: Insufficient documentation

## 2011-10-30 DIAGNOSIS — D509 Iron deficiency anemia, unspecified: Secondary | ICD-10-CM

## 2011-10-30 DIAGNOSIS — C029 Malignant neoplasm of tongue, unspecified: Secondary | ICD-10-CM | POA: Insufficient documentation

## 2011-10-30 HISTORY — PX: HEMIGLOSSECTOMY: SHX1740

## 2011-10-30 HISTORY — PX: RADICAL NECK DISSECTION: SHX2284

## 2011-10-30 SURGERY — HEMIGLOSSECTOMY
Anesthesia: General | Site: Neck | Laterality: Right | Wound class: Clean Contaminated

## 2011-10-30 MED ORDER — EPHEDRINE SULFATE 50 MG/ML IJ SOLN
INTRAMUSCULAR | Status: DC | PRN
  Administered 2011-10-30: 2.5 mg via INTRAVENOUS
  Administered 2011-10-30: 5 mg via INTRAVENOUS

## 2011-10-30 MED ORDER — CLINDAMYCIN PHOSPHATE 600 MG/50ML IV SOLN
600.0000 mg | Freq: Once | INTRAVENOUS | Status: AC
  Administered 2011-10-30: 600 mg via INTRAVENOUS
  Filled 2011-10-30: qty 50

## 2011-10-30 MED ORDER — ONDANSETRON HCL 4 MG/2ML IJ SOLN: 4.0000 mg | INTRAMUSCULAR | Status: DC | PRN

## 2011-10-30 MED ORDER — PROPOFOL 10 MG/ML IV EMUL
INTRAVENOUS | Status: DC | PRN
  Administered 2011-10-30: 150 mg via INTRAVENOUS

## 2011-10-30 MED ORDER — ONDANSETRON HCL 4 MG/2ML IJ SOLN
INTRAMUSCULAR | Status: DC | PRN
  Administered 2011-10-30: 4 mg via INTRAVENOUS

## 2011-10-30 MED ORDER — SODIUM CHLORIDE 0.9 % IR SOLN
Status: DC | PRN
  Administered 2011-10-30: 1000 mL

## 2011-10-30 MED ORDER — HYDROMORPHONE HCL PF 1 MG/ML IJ SOLN
INTRAMUSCULAR | Status: AC
  Filled 2011-10-30: qty 1

## 2011-10-30 MED ORDER — SERTRALINE HCL 50 MG PO TABS
50.0000 mg | ORAL_TABLET | ORAL | Status: DC
  Filled 2011-10-30: qty 1

## 2011-10-30 MED ORDER — LACTATED RINGERS IV SOLN
INTRAVENOUS | Status: DC | PRN
  Administered 2011-10-30 (×3): via INTRAVENOUS

## 2011-10-30 MED ORDER — MIDAZOLAM HCL 5 MG/5ML IJ SOLN
INTRAMUSCULAR | Status: DC | PRN
  Administered 2011-10-30: 2 mg via INTRAVENOUS

## 2011-10-30 MED ORDER — DEXTROSE-NACL 5-0.45 % IV SOLN
INTRAVENOUS | Status: DC
  Administered 2011-10-30: 21:00:00 via INTRAVENOUS

## 2011-10-30 MED ORDER — DEXTROSE 5 % IV SOLN
INTRAVENOUS | Status: AC
  Filled 2011-10-30: qty 50

## 2011-10-30 MED ORDER — BACITRACIN ZINC 500 UNIT/GM EX OINT
TOPICAL_OINTMENT | CUTANEOUS | Status: DC | PRN
  Administered 2011-10-30: 1 via TOPICAL

## 2011-10-30 MED ORDER — LACTATED RINGERS IV SOLN
INTRAVENOUS | Status: DC
  Administered 2011-10-30: 12:00:00 via INTRAVENOUS

## 2011-10-30 MED ORDER — SERTRALINE HCL 50 MG PO TABS: 50.0000 mg | ORAL_TABLET | Freq: Every day | ORAL | Status: DC

## 2011-10-30 MED ORDER — NEOSTIGMINE METHYLSULFATE 1 MG/ML IJ SOLN
INTRAMUSCULAR | Status: DC | PRN
  Administered 2011-10-30: 3 mg via INTRAVENOUS

## 2011-10-30 MED ORDER — LEVOTHYROXINE SODIUM 112 MCG PO TABS
112.0000 ug | ORAL_TABLET | Freq: Every day | ORAL | Status: DC
  Administered 2011-10-31: 112 ug via ORAL
  Filled 2011-10-30 (×2): qty 1

## 2011-10-30 MED ORDER — BACITRACIN ZINC 500 UNIT/GM EX OINT
1.0000 "application " | TOPICAL_OINTMENT | Freq: Three times a day (TID) | CUTANEOUS | Status: DC
  Filled 2011-10-30: qty 15

## 2011-10-30 MED ORDER — SIMVASTATIN 20 MG PO TABS
20.0000 mg | ORAL_TABLET | Freq: Every day | ORAL | Status: DC
  Filled 2011-10-30 (×2): qty 1

## 2011-10-30 MED ORDER — GLYCOPYRROLATE 0.2 MG/ML IJ SOLN
INTRAMUSCULAR | Status: DC | PRN
  Administered 2011-10-30: .4 mg via INTRAVENOUS

## 2011-10-30 MED ORDER — ACETAMINOPHEN 160 MG/5ML PO SOLN
650.0000 mg | Freq: Four times a day (QID) | ORAL | Status: DC | PRN
  Administered 2011-10-30 – 2011-10-31 (×3): 650 mg via ORAL
  Filled 2011-10-30 (×3): qty 20.3

## 2011-10-30 MED ORDER — ROCURONIUM BROMIDE 100 MG/10ML IV SOLN
INTRAVENOUS | Status: DC | PRN
  Administered 2011-10-30: 20 mg via INTRAVENOUS
  Administered 2011-10-30: 30 mg via INTRAVENOUS

## 2011-10-30 MED ORDER — CLINDAMYCIN PHOSPHATE 600 MG/50ML IV SOLN
600.0000 mg | Freq: Three times a day (TID) | INTRAVENOUS | Status: DC
  Administered 2011-10-30 – 2011-10-31 (×2): 600 mg via INTRAVENOUS
  Filled 2011-10-30 (×4): qty 50

## 2011-10-30 MED ORDER — MORPHINE SULFATE 2 MG/ML IJ SOLN: 2.0000 mg | INTRAMUSCULAR | Status: DC | PRN

## 2011-10-30 MED ORDER — FENTANYL CITRATE 0.05 MG/ML IJ SOLN
INTRAMUSCULAR | Status: DC | PRN
  Administered 2011-10-30: 50 ug via INTRAVENOUS
  Administered 2011-10-30: 250 ug via INTRAVENOUS
  Administered 2011-10-30 (×4): 50 ug via INTRAVENOUS

## 2011-10-30 MED ORDER — HYDROMORPHONE HCL PF 1 MG/ML IJ SOLN
0.2500 mg | INTRAMUSCULAR | Status: DC | PRN
  Administered 2011-10-30: 0.5 mg via INTRAVENOUS

## 2011-10-30 MED ORDER — VECURONIUM BROMIDE 10 MG IV SOLR
INTRAVENOUS | Status: DC | PRN
  Administered 2011-10-30: 2 mg via INTRAVENOUS

## 2011-10-30 MED ORDER — CLINDAMYCIN PHOSPHATE 600 MG/4ML IJ SOLN
INTRAMUSCULAR | Status: AC
  Filled 2011-10-30: qty 4

## 2011-10-30 MED ORDER — SERTRALINE HCL 50 MG PO TABS
75.0000 mg | ORAL_TABLET | ORAL | Status: DC
  Filled 2011-10-30: qty 1

## 2011-10-30 MED ORDER — PROMETHAZINE HCL 25 MG/ML IJ SOLN
6.2500 mg | INTRAMUSCULAR | Status: DC | PRN
  Administered 2011-10-30: 6.25 mg via INTRAVENOUS

## 2011-10-30 MED ORDER — OXYCODONE-ACETAMINOPHEN 5-325 MG/5ML PO SOLN: 5.0000 mL | ORAL | Status: DC | PRN

## 2011-10-30 MED ORDER — ONDANSETRON HCL 4 MG PO TABS: 4.0000 mg | ORAL_TABLET | ORAL | Status: DC | PRN

## 2011-10-30 SURGICAL SUPPLY — 59 items
ATTRACTOMAT 16X20 MAGNETIC DRP (DRAPES) IMPLANT
BACITRACIN OINTMENT ×3 IMPLANT
BLADE SURG 15 STRL LF DISP TIS (BLADE) IMPLANT
BLADE SURG 15 STRL SS (BLADE)
CANISTER SUCTION 2500CC (MISCELLANEOUS) ×3 IMPLANT
CLEANER TIP ELECTROSURG 2X2 (MISCELLANEOUS) ×6 IMPLANT
CLOTH BEACON ORANGE TIMEOUT ST (SAFETY) ×3 IMPLANT
CONT SPEC 4OZ CLIKSEAL STRL BL (MISCELLANEOUS) ×3 IMPLANT
COVER SURGICAL LIGHT HANDLE (MISCELLANEOUS) ×6 IMPLANT
COVER TABLE BACK 60X90 (DRAPES) IMPLANT
CRADLE DONUT ADULT HEAD (MISCELLANEOUS) IMPLANT
DRAIN CHANNEL 15F RND FF W/TCR (WOUND CARE) IMPLANT
DRAIN HEMOVAC 1/8 X 5 (WOUND CARE) ×3 IMPLANT
ELECT COATED BLADE 2.86 ST (ELECTRODE) ×6 IMPLANT
ELECT REM PT RETURN 9FT ADLT (ELECTROSURGICAL) ×3
ELECTRODE REM PT RTRN 9FT ADLT (ELECTROSURGICAL) ×2 IMPLANT
EVACUATOR SILICONE 100CC (DRAIN) ×6 IMPLANT
GAUZE SPONGE 4X4 16PLY XRAY LF (GAUZE/BANDAGES/DRESSINGS) ×3 IMPLANT
GLOVE BIOGEL PI IND STRL 7.0 (GLOVE) ×4 IMPLANT
GLOVE BIOGEL PI INDICATOR 7.0 (GLOVE) ×2
GLOVE ECLIPSE 7.5 STRL STRAW (GLOVE) ×6 IMPLANT
GLOVE SS BIOGEL STRL SZ 6.5 (GLOVE) ×2 IMPLANT
GLOVE SS BIOGEL STRL SZ 7.5 (GLOVE) ×4 IMPLANT
GLOVE SUPERSENSE BIOGEL SZ 6.5 (GLOVE) ×1
GLOVE SUPERSENSE BIOGEL SZ 7.5 (GLOVE) ×2
GOWN STRL NON-REIN LRG LVL3 (GOWN DISPOSABLE) ×3 IMPLANT
GUARD TEETH (MISCELLANEOUS) IMPLANT
KIT BASIN OR (CUSTOM PROCEDURE TRAY) ×3 IMPLANT
KIT ROOM TURNOVER OR (KITS) ×3 IMPLANT
LOCATOR NERVE 3 VOLT (DISPOSABLE) ×3 IMPLANT
NS IRRIG 1000ML POUR BTL (IV SOLUTION) ×3 IMPLANT
PAD ARMBOARD 7.5X6 YLW CONV (MISCELLANEOUS) ×6 IMPLANT
PENCIL FOOT CONTROL (ELECTRODE) ×3 IMPLANT
SPECIMEN JAR MEDIUM (MISCELLANEOUS) IMPLANT
SPECIMEN JAR SMALL (MISCELLANEOUS) IMPLANT
SPONGE INTESTINAL PEANUT (DISPOSABLE) IMPLANT
SPONGE LAP 18X18 X RAY DECT (DISPOSABLE) ×3 IMPLANT
STAPLER VISISTAT 35W (STAPLE) ×3 IMPLANT
SURGILUBE 2OZ TUBE FLIPTOP (MISCELLANEOUS) IMPLANT
SUT CHROMIC 3 0 SH 27 (SUTURE) ×3 IMPLANT
SUT CHROMIC 5 0 P 3 (SUTURE) IMPLANT
SUT ETHILON 3 0 PS 1 (SUTURE) ×3 IMPLANT
SUT ETHILON 5 0 PS 2 18 (SUTURE) IMPLANT
SUT SILK 2 0 (SUTURE) ×1
SUT SILK 2 0 SH CR/8 (SUTURE) ×3 IMPLANT
SUT SILK 2-0 18XBRD TIE 12 (SUTURE) ×2 IMPLANT
SUT SILK 3 0 (SUTURE) ×2
SUT SILK 3-0 18XBRD TIE 12 (SUTURE) ×4 IMPLANT
SUT SILK 4 0 (SUTURE) ×5
SUT SILK 4-0 18XBRD TIE 12 (SUTURE) ×10 IMPLANT
SUT VIC AB 3-0 FS2 27 (SUTURE) ×12 IMPLANT
SUT VIC AB 3-0 SH 18 (SUTURE) IMPLANT
TOWEL OR 17X24 6PK STRL BLUE (TOWEL DISPOSABLE) ×3 IMPLANT
TOWEL OR 17X26 10 PK STRL BLUE (TOWEL DISPOSABLE) ×3 IMPLANT
TRAY ENT MC OR (CUSTOM PROCEDURE TRAY) ×3 IMPLANT
TRAY FOLEY CATH 14FRSI W/METER (CATHETERS) IMPLANT
TUBE CONNECTING 12X1/4 (SUCTIONS) IMPLANT
TUBE FEEDING 10FR FLEXIFLO (MISCELLANEOUS) IMPLANT
WATER STERILE IRR 1000ML POUR (IV SOLUTION) IMPLANT

## 2011-10-30 NOTE — OR Nursing (Signed)
PAS stockings on patient throughout surgical procedure

## 2011-10-30 NOTE — Anesthesia Preprocedure Evaluation (Signed)
Anesthesia Evaluation  Patient identified by MRN, date of birth, ID band Patient awake    Reviewed: Allergy & Precautions, H&P , NPO status , Patient's Chart, lab work & pertinent test results  Airway Mallampati: II TM Distance: >3 FB Neck ROM: Full    Dental No notable dental hx. (+) Teeth Intact and Dental Advisory Given   Pulmonary neg pulmonary ROS,  clear to auscultation  Pulmonary exam normal       Cardiovascular neg cardio ROS Regular Normal    Neuro/Psych Occasional low back pain    GI/Hepatic Neg liver ROS, GERD-  Controlled,  Endo/Other  Hypothyroidism (on synthroid)   Renal/GU negative Renal ROS     Musculoskeletal negative musculoskeletal ROS (+)   Abdominal Normal abdominal exam  (+)   Peds  Hematology negative hematology ROS (+)   Anesthesia Other Findings   Reproductive/Obstetrics                           Anesthesia Physical Anesthesia Plan  ASA: II  Anesthesia Plan: General   Post-op Pain Management:    Induction: Intravenous  Airway Management Planned: Oral ETT  Additional Equipment:   Intra-op Plan:   Post-operative Plan: Extubation in OR  Informed Consent: I have reviewed the patients History and Physical, chart, labs and discussed the procedure including the risks, benefits and alternatives for the proposed anesthesia with the patient or authorized representative who has indicated his/her understanding and acceptance.   Dental advisory given  Plan Discussed with: CRNA and Surgeon  Anesthesia Plan Comments: (Plan routine monitors, GETA)        Anesthesia Quick Evaluation

## 2011-10-30 NOTE — Anesthesia Postprocedure Evaluation (Signed)
  Anesthesia Post-op Note  Patient: [facility]  Procedure(s) Performed:  HEMIGLOSSECTOMY - Right tongue resection; RADICAL NECK DISSECTION  Patient Location: PACU  Anesthesia Type: General  Level of Consciousness: awake, alert  and oriented  Airway and Oxygen Therapy: Patient Spontanous Breathing and Patient connected to nasal cannula oxygen  Post-op Pain: none  Post-op Assessment: Post-op Vital signs reviewed, Patient's Cardiovascular Status Stable, Respiratory Function Stable, RESPIRATORY FUNCTION UNSTABLE, No signs of Nausea or vomiting and Pain level controlled  Post-op Vital Signs: Reviewed and stable  Complications: No apparent anesthesia complications

## 2011-10-30 NOTE — Op Note (Signed)
Preop/postop diagnosis: Squamous cell carcinoma the right tongue Procedure: Right hemi-glossectomy, right supraomohyoid neck dissection Anesthesia: Gen. Estimated blood loss: Less than 50 cc Indications 60 year old who's had a squamous cell carcinoma biopsy proven from the right lateral tongue. He said PET scan and CT scan was suggestive nodes in his right jugular digastric region. The PET scan did not increased signal but the patient wanted to proceed with neck dissection knowing that these may all be negative nodes. He was informed a risk and benefits of the procedure and options were discussed all questions are answered and consent was obtained. Operation: Patient was taken to the operating room and placed in the supine position after general endotracheal tube anesthesia was placed in the supine position prepped and draped in the usual sterile manner. The neck was kept isolated with a separate towel. A towel clip was placed in the midline of the tongue and the tongue was extended out to allow visualization of the right lateral tongue lesion. An outline of this lesion was made using electrocautery with about proximately centimeter of margin around it. This was then dissected with electrocautery going into the tongue musculature and dissecting the typical piece out of the area. This was sent for frozen section and there was clear margins. The wound was then irrigated and closed with interrupted 3-0 Vicryl. The neck was then addressed changing gloves and instruments the right neck was outlined with a apron type incision dissection was carried out with electrocautery through the skin and platysma muscle and then a subplatysmal muscle flap was elevated. The dissection was then carried toward the marginal mandibular nerve and the cervical branch. The nerve was identified. The cervical branch was identified it was dissected as it appeared to be moving up toward the chin but most likely this is just the nerve that  had been dissected from the platysma as it was elevated for from the flap. The margin are seen to be up and around the angle of the mandible. All other was here to make sure there was no further branches in the submandibular triangle. These fashions and taken off the edge of the mandibular) on the vessels were clamped and divided the dissection of the 7 due to time was performed preserving the lingual nerve and the hypoglossal nerve. The mylohyoid was dissected back to get all of the gland out. Dissection was then carried off the digastric and over the digastric tendon to the posterior digastric. The fascia taken off the sternocleidomastoid muscle and the accessory nerve was identified and preserved. The fascia was taken out from above and then brought underneath the nerve. The fascia wass and brought out of the gutter over the cervical plexus and over the jugular vein. The fascia was then removed off the carotid artery preserving it as well as the vagus nerve. Care was taken  to clamp and divide all the vessels. The specimen is an carried down to the level of the omohyoid muscle. The specimen was sent for pathology. Wound was then irrigated with saline and a #10 JP drain placed and secured with 3-0 nylon the skin flap was repositioned and). 3-0 chromic and then skin staples. This was an awake and brought to cover stable condition counts correct

## 2011-10-30 NOTE — Progress Notes (Signed)
10/30/2011 7:13 PM  [facility] [id number]  Post-Op Day 0    Temp:  [96.5 F (35.8 C)-98 F (36.7 C)] 96.5 F (35.8 C) (12/12 1833) Pulse Rate:  [61-97] 76  (12/12 1833) Resp:  [7-19] 18  (12/12 1833) BP: (112-143)/(67-80) 118/74 mmHg (12/12 1833) SpO2:  [98 %-100 %] 98 % (12/12 1833),     Intake/Output Summary (Last 24 hours) at 10/30/11 1913 Last data filed at 10/30/11 1804  Gross per 24 hour  Intake   2400 ml  Output    195 ml  Net   2205 ml   Small po.  No void thus far.  No results found for this or any previous visit (from the past 24 hour(s)).  SUBJECTIVE:  uncomfortable for lack of voiding.  Past history of urinary retention post op in 1990, with difficult catheter placement requiring Urology assistance.Bladder scan today shows 530 cc.    Neck pain mild.  Pt aware he is sensitive to narcotics.  No SOB or chest pain.  OBJECTIVE:  Neck flat.  Drain functional.  He is sleepy but appropriate.  He stood to void in my presence, unsuccessfully.CN XI, XII intact.Pia Mau R r.. Mandibularis weakness.  IMPRESSION:  Urinary retention.  Satisfactory surgical check.  PLAN:  Tylenol for pain at his request.  I/O cath.  May need Urologist.  Advance diet. Cephus Richer

## 2011-10-30 NOTE — Transfer of Care (Signed)
Immediate Anesthesia Transfer of Care Note  Patient: [facility]  Procedure(s) Performed:  HEMIGLOSSECTOMY - Right tongue resection; RADICAL NECK DISSECTION  Patient Location: PACU  Anesthesia Type: General  Level of Consciousness: awake, alert  and oriented  Airway & Oxygen Therapy: Patient Spontanous Breathing and Patient connected to nasal cannula oxygen  Post-op Assessment: Report given to PACU RN and Post -op Vital signs reviewed and stable  Post vital signs: Reviewed and stable  Complications: No apparent anesthesia complications

## 2011-10-30 NOTE — Anesthesia Procedure Notes (Addendum)
Procedure Name: Intubation Date/Time: 10/30/2011 12:17 PM Performed by: Rosita Fire Pre-anesthesia Checklist: Emergency Drugs available, Suction available, Patient being monitored and Patient identified Patient Re-evaluated:Patient Re-evaluated prior to inductionOxygen Delivery Method: Circle System Utilized Preoxygenation: Pre-oxygenation with 100% oxygen Intubation Type: IV induction Ventilation: Mask ventilation without difficulty Laryngoscope Size: Miller and 2 Grade View: Grade II Tube type: Oral Tube size: 7.5 mm Number of attempts: 1 Secured at: 23 cm Tube secured with: Tape Dental Injury: Teeth and Oropharynx as per pre-operative assessment

## 2011-10-30 NOTE — H&P (Signed)
William Luna is an 60 y.o. male.   Chief Complaint:squamous cell carcinoma the right tongue HPI: he is here for evaluation and treatment of a right squamous cell carcinoma of the tongue. He has had extensive workup and now wants to proceed with excision of the right tongue as well as right neck dissection.  Past Medical History  Diagnosis Date  . Hypothyroidism   . Anxiety   . HYPERLIPIDEMIA, MILD 11/17/2008  . ANEMIA, IRON DEFICIENCY 11/17/2008  . GERD 11/17/2008  . 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 CERVICAL RADICULOPATHY 11/17/2008  . TRANSAMINASES, SERUM, ELEVATED 11/17/2008    Past Surgical History  Procedure Date  . Back surgery     CERVICAL DISC    No family history on file. Social History:  reports that he has never smoked. He has never used smokeless tobacco. He reports that he drinks alcohol. He reports that he does not use illicit drugs.  Allergies: No Known Allergies  Medications Prior to Admission  Medication Dose Route Frequency Provider Last Rate Last Dose  . clindamycin (CLEOCIN) IVPB 600 mg  600 mg Intravenous Once William Luna       Medications Prior to Admission  Medication Sig Dispense Refill  . predniSONE (DELTASONE) 10 MG tablet Take 6 tabs for 3 days, then 4 tabs for 4 days, then 2 tabs for 4 days, then 1 tab for 3 days, then stop  45 tablet  0  . sertraline (ZOLOFT) 50 MG tablet Take 50-75 mg by mouth daily. Pt alternates between 50 mg (1 tab) one day and 75 mg (1 1/2 tabs) the next day         Results for orders placed during the hospital encounter of 10/28/11 (from the past 48 hour(s))  GLUCOSE, CAPILLARY     Status: Normal   Collection Time   10/28/11  1:42 PM      Component Value Range Comment   Glucose-Capillary 82  70 - 99 (mg/dL)    Dg Chest 2 View  46/96/2952  *RADIOLOGY REPORT*  Clinical Data: Abnormal neck nodes, lymphadenopathy, question chest disease  CHEST - 2 VIEW  Comparison: None  Findings: Normal heart size,  mediastinal contours, and pulmonary vascularity. Minimal peribronchial thickening. Lungs otherwise clear. No pleural effusion or pneumothorax. Question bilateral nipple shadows. No acute osseous findings.  IMPRESSION: Minimal bronchitic changes. Probable bilateral nipple shadows; recommend repeat PA chest radiograph with nipple markers to exclude pulmonary nodules.  Original Report Authenticated By: William Luna, M.D.   Nm Pet Image Initial (pi) Skull Base To Thigh  10/28/2011  *RADIOLOGY REPORT*  Clinical Data: Initial treatment strategy for head neck cancer. Lateral tongue carcinoma.  NUCLEAR MEDICINE PET SKULL BASE TO THIGH  Fasting Blood Glucose:  82  Technique:  18.3 mCi F-18 FDG was injected intravenously.   CT data was obtained and used for attenuation correction and anatomic localization only.  (This was not acquired as a diagnostic CT examination.) Additional exam technical data entered  on technologist worksheet.  Right arm injection.  Comparison: Head CT 10/24/2011  Findings:  Neck: There is no asymmetric activity within the tongue to localize the primary carcinoma.  The right  lateral neck level II lymph nodes of concern on prior CT measure 6 mm short axis and 7 mm short axis (images 38 and 34 respectively)  on dedicated neck series (series 2).  These nodes have low metabolic activity ( SUV max = 2.5) which is similar to background blood pool activity.  This is felt to represent inflammatory or reactive activity rather than neoplastic.  No additional hypermetabolic nodes in the neck.  Chest: No hypermetabolic mediastinal or hilar nodes.  No suspicious pulmonary nodules.  A low density lesion in the liver has  simple fluid attenuation and no associated metabolic activity.  There is a tiny 2 mm calculus within the right kidney which is nonobstructive. Low density cyst within the left kidney is simple fluid attenuation.  Abdomen / Pelvis:No abnormal hypermetabolic activity within the solid organs.  No  evidence of abdominal or pelvic hypermetabolic nodes.  Skeleton:No focal hypermetabolic activity to suggest skeletal metastasis.  IMPRESSION:  1.  No evidence of local head neck cancer nodal metastasis.  2.   Level II lymph nodes on the right have low metabolic activity which is felt to be reactive. 3.  No evidence  of  distal metastasis. 4.  Incidental finding including the hepatic cyst, renal cyst, and small right renal calculus.  Findings discussed  Dr. Jearld Luna on 10/28/2011 at 1445 hours  Original Report Authenticated By: William Luna, M.D.    Review of Systems  Constitutional: Negative.   HENT: Negative.        He has a whitish area on the right lateral tongue  Eyes: Negative.   Cardiovascular: Negative.   Skin: Negative.     Blood pressure 128/76, pulse 61, temperature 97.6 F (36.4 C), temperature source Oral, resp. rate 18, SpO2 98.00%. Physical Exam   Assessment/Plan Squamous cell carcinoma of the right tongue-we have discussed extensively his situation and he has seen oncology. He's undergone a CT scan and a PET scan. We discussed his options and he was to proceed with excision of the tongue area as well as right neck dissection. We discussed these procedures.  William Luna M 10/30/2011, 11:02 AM

## 2011-10-30 NOTE — Preoperative (Signed)
Beta Blockers   Reason not to administer Beta Blockers:Not Applicable 

## 2011-10-31 ENCOUNTER — Encounter (HOSPITAL_COMMUNITY): Payer: Self-pay | Admitting: Otolaryngology

## 2011-10-31 MED ORDER — CLINDAMYCIN HCL 300 MG PO CAPS: 300.0000 mg | ORAL_CAPSULE | Freq: Three times a day (TID) | ORAL | Status: AC

## 2011-10-31 MED ORDER — HYDROCODONE-ACETAMINOPHEN 7.5-500 MG PO TABS: 1.0000 | ORAL_TABLET | Freq: Four times a day (QID) | ORAL | Status: AC | PRN

## 2011-10-31 NOTE — Progress Notes (Signed)
Discharged home. Home discharge instruction given, no questions verbalized. 

## 2011-11-08 ENCOUNTER — Other Ambulatory Visit: Payer: Self-pay | Admitting: Internal Medicine

## 2011-11-08 MED ORDER — ZALEPLON 10 MG PO CAPS: 10.0000 mg | ORAL_CAPSULE | Freq: Every day | ORAL | Status: DC

## 2011-11-13 ENCOUNTER — Other Ambulatory Visit: Payer: Self-pay | Admitting: *Deleted

## 2011-11-13 MED ORDER — ATORVASTATIN CALCIUM 10 MG PO TABS: 10.0000 mg | ORAL_TABLET | Freq: Every day | ORAL | Status: DC

## 2011-11-13 MED ORDER — LEVOTHYROXINE SODIUM 112 MCG PO TABS: 112.0000 ug | ORAL_TABLET | Freq: Every day | ORAL | Status: DC

## 2011-12-09 ENCOUNTER — Telehealth: Payer: Self-pay | Admitting: *Deleted

## 2011-12-09 NOTE — Telephone Encounter (Signed)
Message on voicemail from pt stating that he would like to speak with Dr. Gaylyn Rong. Per the pt the question is non urgent and Dr. Gaylyn Rong can follow up at his earliest convenience. Call back # [phone] (cell)

## 2012-01-09 ENCOUNTER — Ambulatory Visit (HOSPITAL_COMMUNITY)
Admission: RE | Admit: 2012-01-09 | Discharge: 2012-01-09 | Disposition: A | Discharge status: Home / Self Care | Payer: 59 | Source: Ambulatory Visit | Attending: Physician Assistant | Admitting: Physician Assistant

## 2012-01-09 ENCOUNTER — Other Ambulatory Visit: Payer: Self-pay | Admitting: Physician Assistant

## 2012-01-09 DIAGNOSIS — R918 Other nonspecific abnormal finding of lung field: Secondary | ICD-10-CM | POA: Insufficient documentation

## 2012-01-09 DIAGNOSIS — R9389 Abnormal findings on diagnostic imaging of other specified body structures: Secondary | ICD-10-CM

## 2012-01-09 NOTE — Progress Notes (Signed)
William Luna is a 61 y.o. male with recent CXR with ? Nodules vs nipple shadows.  He needs a repeat CXR with nipple markers.  Will arrange. Tereso Newcomer, PA-C  1:29 PM 01/09/2012

## 2012-01-09 NOTE — Progress Notes (Signed)
Addended byAlben Spittle, Lorin Picket T on: 01/09/2012 02:03 PM   Modules accepted: Orders

## 2012-01-23 ENCOUNTER — Other Ambulatory Visit: Payer: Self-pay | Admitting: Physician Assistant

## 2012-01-23 ENCOUNTER — Encounter: Payer: Self-pay | Admitting: Physician Assistant

## 2012-01-23 DIAGNOSIS — M479 Spondylosis, unspecified: Secondary | ICD-10-CM

## 2012-01-23 MED ORDER — PREDNISONE 20 MG PO TABS: ORAL_TABLET | ORAL | Status: DC

## 2012-01-23 NOTE — Progress Notes (Signed)
William Luna is a 61 y.o. male with lumbar DDD.  Notes increased pain in L5-S1 distribution.  He went skiing this weekend.  Pain is no worse.  Pain responded to prednisone in the past.  He would like to take another round of prednisone.  I will send in a Rx for Prednisone with appropriate taper. Tereso Newcomer, PA-C  4:50 PM 01/23/2012

## 2012-01-27 ENCOUNTER — Ambulatory Visit
Admission: RE | Admit: 2012-01-27 | Discharge: 2012-01-27 | Disposition: A | Discharge status: Home / Self Care | Payer: 59 | Source: Ambulatory Visit | Attending: Neurology | Admitting: Neurology

## 2012-01-27 ENCOUNTER — Other Ambulatory Visit: Payer: Self-pay | Admitting: Neurology

## 2012-01-27 DIAGNOSIS — M5410 Radiculopathy, site unspecified: Secondary | ICD-10-CM

## 2012-01-27 MED ORDER — IOHEXOL 180 MG/ML  SOLN
1.0000 mL | Freq: Once | INTRAMUSCULAR | Status: AC | PRN
  Administered 2012-01-27: 1 mL via EPIDURAL

## 2012-01-27 MED ORDER — METHYLPREDNISOLONE ACETATE 40 MG/ML INJ SUSP (RADIOLOG
120.0000 mg | Freq: Once | INTRAMUSCULAR | Status: AC
  Administered 2012-01-27: 120 mg via EPIDURAL

## 2012-01-27 NOTE — Discharge Instructions (Signed)

## 2012-01-28 ENCOUNTER — Other Ambulatory Visit: Payer: Self-pay | Admitting: Neurological Surgery

## 2012-01-28 DIAGNOSIS — M545 Low back pain: Secondary | ICD-10-CM

## 2012-01-29 ENCOUNTER — Other Ambulatory Visit: Payer: Self-pay | Admitting: Neurological Surgery

## 2012-01-29 ENCOUNTER — Encounter (HOSPITAL_COMMUNITY)
Admission: RE | Disposition: A | Discharge status: Home / Self Care | Payer: Self-pay | Source: Ambulatory Visit | Attending: Neurological Surgery

## 2012-01-29 ENCOUNTER — Encounter (HOSPITAL_COMMUNITY): Payer: Self-pay | Admitting: Certified Registered"

## 2012-01-29 ENCOUNTER — Ambulatory Visit (HOSPITAL_COMMUNITY)
Admission: RE | Admit: 2012-01-29 | Discharge: 2012-01-30 | Disposition: A | Discharge status: Home / Self Care | Payer: 59 | Source: Ambulatory Visit | Attending: Neurological Surgery | Admitting: Neurological Surgery

## 2012-01-29 ENCOUNTER — Ambulatory Visit
Admission: RE | Admit: 2012-01-29 | Discharge: 2012-01-29 | Disposition: A | Discharge status: Home / Self Care | Payer: 59 | Source: Ambulatory Visit | Attending: Neurological Surgery | Admitting: Neurological Surgery

## 2012-01-29 ENCOUNTER — Encounter (HOSPITAL_COMMUNITY): Payer: Self-pay | Admitting: *Deleted

## 2012-01-29 ENCOUNTER — Ambulatory Visit (HOSPITAL_COMMUNITY): Payer: 59 | Admitting: Certified Registered"

## 2012-01-29 DIAGNOSIS — K219 Gastro-esophageal reflux disease without esophagitis: Secondary | ICD-10-CM | POA: Insufficient documentation

## 2012-01-29 DIAGNOSIS — F411 Generalized anxiety disorder: Secondary | ICD-10-CM | POA: Insufficient documentation

## 2012-01-29 DIAGNOSIS — M545 Low back pain: Secondary | ICD-10-CM

## 2012-01-29 DIAGNOSIS — E039 Hypothyroidism, unspecified: Secondary | ICD-10-CM | POA: Insufficient documentation

## 2012-01-29 DIAGNOSIS — M5126 Other intervertebral disc displacement, lumbar region: Secondary | ICD-10-CM | POA: Insufficient documentation

## 2012-01-29 HISTORY — PX: LUMBAR LAMINECTOMY/DECOMPRESSION MICRODISCECTOMY: SHX5026

## 2012-01-29 LAB — SURGICAL PCR SCREEN: Staphylococcus aureus: NEGATIVE

## 2012-01-29 LAB — CBC
MCH: 28.7 pg (ref 26.0–34.0)
MCHC: 33.7 g/dL (ref 30.0–36.0)
MCV: 85.1 fL (ref 78.0–100.0)
Platelets: 218 10*3/uL (ref 150–400)

## 2012-01-29 LAB — BASIC METABOLIC PANEL
Calcium: 10.5 mg/dL (ref 8.4–10.5)
Creatinine, Ser: 0.85 mg/dL (ref 0.50–1.35)
GFR calc non Af Amer: >90 mL/min (ref >90)
Glucose, Bld: 96 mg/dL (ref 70–99)
Sodium: 137 mEq/L (ref 135–145)

## 2012-01-29 SURGERY — LUMBAR LAMINECTOMY/DECOMPRESSION MICRODISCECTOMY
Anesthesia: General | Site: Back | Laterality: Left | Wound class: Clean

## 2012-01-29 MED ORDER — LIDOCAINE-EPINEPHRINE 1 %-1:100000 IJ SOLN
INTRAMUSCULAR | Status: DC | PRN
  Administered 2012-01-29: 6 mL

## 2012-01-29 MED ORDER — BACITRACIN 50000 UNITS IM SOLR
INTRAMUSCULAR | Status: AC
  Filled 2012-01-29: qty 1

## 2012-01-29 MED ORDER — SODIUM CHLORIDE 0.9 % IR SOLN
Status: DC | PRN
  Administered 2012-01-29: 22:00:00

## 2012-01-29 MED ORDER — LACTATED RINGERS IV SOLN
INTRAVENOUS | Status: DC | PRN
  Administered 2012-01-29 (×2): via INTRAVENOUS

## 2012-01-29 MED ORDER — ONDANSETRON HCL 4 MG/2ML IJ SOLN
INTRAMUSCULAR | Status: DC | PRN
  Administered 2012-01-29: 4 mg via INTRAVENOUS

## 2012-01-29 MED ORDER — FENTANYL CITRATE 0.05 MG/ML IJ SOLN
INTRAMUSCULAR | Status: DC | PRN
  Administered 2012-01-29: 50 ug via INTRAVENOUS
  Administered 2012-01-29: 100 ug via INTRAVENOUS
  Administered 2012-01-29 (×2): 50 ug via INTRAVENOUS

## 2012-01-29 MED ORDER — EPHEDRINE SULFATE 50 MG/ML IJ SOLN
INTRAMUSCULAR | Status: DC | PRN
  Administered 2012-01-29: 10 mg via INTRAVENOUS

## 2012-01-29 MED ORDER — BUPIVACAINE HCL (PF) 0.5 % IJ SOLN
INTRAMUSCULAR | Status: DC | PRN
  Administered 2012-01-29: 6 mL
  Administered 2012-01-29: 18 mL

## 2012-01-29 MED ORDER — PROPOFOL 10 MG/ML IV EMUL
INTRAVENOUS | Status: DC | PRN
  Administered 2012-01-29: 150 mg via INTRAVENOUS

## 2012-01-29 MED ORDER — GLYCOPYRROLATE 0.2 MG/ML IJ SOLN
INTRAMUSCULAR | Status: DC | PRN
  Administered 2012-01-29: .6 mg via INTRAVENOUS

## 2012-01-29 MED ORDER — NEOSTIGMINE METHYLSULFATE 1 MG/ML IJ SOLN
INTRAMUSCULAR | Status: DC | PRN
  Administered 2012-01-29: 4 mg via INTRAVENOUS

## 2012-01-29 MED ORDER — HEMOSTATIC AGENTS (NO CHARGE) OPTIME
TOPICAL | Status: DC | PRN
  Administered 2012-01-29: 1 via TOPICAL

## 2012-01-29 MED ORDER — ROCURONIUM BROMIDE 100 MG/10ML IV SOLN
INTRAVENOUS | Status: DC | PRN
  Administered 2012-01-29: 50 mg via INTRAVENOUS

## 2012-01-29 MED ORDER — HYDROMORPHONE HCL PF 1 MG/ML IJ SOLN: 0.2500 mg | INTRAMUSCULAR | Status: DC | PRN

## 2012-01-29 MED ORDER — MUPIROCIN 2 % EX OINT: TOPICAL_OINTMENT | Freq: Two times a day (BID) | CUTANEOUS | Status: DC

## 2012-01-29 MED ORDER — SODIUM CHLORIDE 0.9 % IV SOLN
INTRAVENOUS | Status: AC
  Filled 2012-01-29: qty 500

## 2012-01-29 MED ORDER — 0.9 % SODIUM CHLORIDE (POUR BTL) OPTIME
TOPICAL | Status: DC | PRN
  Administered 2012-01-29: 1000 mL

## 2012-01-29 MED ORDER — CEFAZOLIN SODIUM 1-5 GM-% IV SOLN
1.0000 g | INTRAVENOUS | Status: AC
  Administered 2012-01-29: 1 g via INTRAVENOUS

## 2012-01-29 MED ORDER — VECURONIUM BROMIDE 10 MG IV SOLR
INTRAVENOUS | Status: DC | PRN
  Administered 2012-01-29 (×3): 2 mg via INTRAVENOUS

## 2012-01-29 MED ORDER — MUPIROCIN 2 % EX OINT
TOPICAL_OINTMENT | CUTANEOUS | Status: AC
  Administered 2012-01-29: 15:00:00
  Filled 2012-01-29: qty 22

## 2012-01-29 MED ORDER — LIDOCAINE HCL (CARDIAC) 20 MG/ML IV SOLN
INTRAVENOUS | Status: DC | PRN
  Administered 2012-01-29: 50 mg via INTRAVENOUS

## 2012-01-29 MED ORDER — ONDANSETRON HCL 4 MG/2ML IJ SOLN
4.0000 mg | Freq: Once | INTRAMUSCULAR | Status: AC | PRN
Start: 2012-01-29 — End: 2012-01-29

## 2012-01-29 MED ORDER — CEFAZOLIN SODIUM 1-5 GM-% IV SOLN
INTRAVENOUS | Status: AC
  Filled 2012-01-29: qty 50

## 2012-01-29 MED ORDER — THROMBIN 5000 UNITS EX SOLR
CUTANEOUS | Status: DC | PRN
  Administered 2012-01-29 (×2): 5000 [IU] via TOPICAL

## 2012-01-29 MED ORDER — MIDAZOLAM HCL 5 MG/5ML IJ SOLN
INTRAMUSCULAR | Status: DC | PRN
  Administered 2012-01-29: 2 mg via INTRAVENOUS

## 2012-01-29 SURGICAL SUPPLY — 52 items
BAG DECANTER FOR FLEXI CONT (MISCELLANEOUS) ×2 IMPLANT
BLADE SURG ROTATE 9660 (MISCELLANEOUS) ×2 IMPLANT
BUR ACORN 6.0 (BURR) ×2 IMPLANT
BUR MATCHSTICK NEURO 3.0 LAGG (BURR) ×2 IMPLANT
CANISTER SUCTION 2500CC (MISCELLANEOUS) ×2 IMPLANT
CLOTH BEACON ORANGE TIMEOUT ST (SAFETY) ×2 IMPLANT
CONT SPEC 4OZ CLIKSEAL STRL BL (MISCELLANEOUS) ×2 IMPLANT
DECANTER SPIKE VIAL GLASS SM (MISCELLANEOUS) IMPLANT
DERMABOND ADVANCED (GAUZE/BANDAGES/DRESSINGS) ×1
DERMABOND ADVANCED .7 DNX12 (GAUZE/BANDAGES/DRESSINGS) ×1 IMPLANT
DRAPE LAPAROTOMY 100X72X124 (DRAPES) ×2 IMPLANT
DRAPE MICROSCOPE LEICA (MISCELLANEOUS) ×2 IMPLANT
DRAPE POUCH INSTRU U-SHP 10X18 (DRAPES) ×2 IMPLANT
DRAPE PROXIMA HALF (DRAPES) IMPLANT
DURAPREP 26ML APPLICATOR (WOUND CARE) ×2 IMPLANT
ELECT REM PT RETURN 9FT ADLT (ELECTROSURGICAL) ×2
ELECTRODE REM PT RTRN 9FT ADLT (ELECTROSURGICAL) ×1 IMPLANT
GAUZE SPONGE 4X4 16PLY XRAY LF (GAUZE/BANDAGES/DRESSINGS) IMPLANT
GLOVE BIO SURGEON STRL SZ 6.5 (GLOVE) ×6 IMPLANT
GLOVE BIOGEL PI IND STRL 8.5 (GLOVE) ×1 IMPLANT
GLOVE BIOGEL PI INDICATOR 8.5 (GLOVE) ×1
GLOVE ECLIPSE 6.5 STRL STRAW (GLOVE) ×2 IMPLANT
GLOVE ECLIPSE 8.5 STRL (GLOVE) ×2 IMPLANT
GLOVE EXAM NITRILE LRG STRL (GLOVE) IMPLANT
GLOVE EXAM NITRILE MD LF STRL (GLOVE) IMPLANT
GLOVE EXAM NITRILE XL STR (GLOVE) IMPLANT
GLOVE EXAM NITRILE XS STR PU (GLOVE) IMPLANT
GLOVE INDICATOR 6.5 STRL GRN (GLOVE) ×4 IMPLANT
GLOVE INDICATOR 7.0 STRL GRN (GLOVE) ×2 IMPLANT
GOWN BRE IMP SLV AUR LG STRL (GOWN DISPOSABLE) ×8 IMPLANT
GOWN BRE IMP SLV AUR XL STRL (GOWN DISPOSABLE) IMPLANT
GOWN STRL REIN 2XL LVL4 (GOWN DISPOSABLE) ×2 IMPLANT
KIT BASIN OR (CUSTOM PROCEDURE TRAY) ×2 IMPLANT
KIT ROOM TURNOVER OR (KITS) ×2 IMPLANT
NEEDLE HYPO 22GX1.5 SAFETY (NEEDLE) ×2 IMPLANT
NEEDLE SPNL 20GX3.5 QUINCKE YW (NEEDLE) IMPLANT
NS IRRIG 1000ML POUR BTL (IV SOLUTION) ×2 IMPLANT
PACK LAMINECTOMY NEURO (CUSTOM PROCEDURE TRAY) ×2 IMPLANT
PAD ARMBOARD 7.5X6 YLW CONV (MISCELLANEOUS) ×6 IMPLANT
PATTIES SURGICAL .5 X1 (DISPOSABLE) IMPLANT
RUBBERBAND STERILE (MISCELLANEOUS) ×4 IMPLANT
SPONGE GAUZE 4X4 12PLY (GAUZE/BANDAGES/DRESSINGS) ×2 IMPLANT
SPONGE LAP 4X18 X RAY DECT (DISPOSABLE) ×2 IMPLANT
SPONGE SURGIFOAM ABS GEL SZ50 (HEMOSTASIS) ×2 IMPLANT
SUT VIC AB 1 CT1 18XBRD ANBCTR (SUTURE) ×1 IMPLANT
SUT VIC AB 1 CT1 8-18 (SUTURE) ×1
SUT VIC AB 2-0 CP2 18 (SUTURE) ×2 IMPLANT
SUT VIC AB 3-0 SH 8-18 (SUTURE) ×2 IMPLANT
SYR 20ML ECCENTRIC (SYRINGE) ×2 IMPLANT
TOWEL OR 17X24 6PK STRL BLUE (TOWEL DISPOSABLE) ×2 IMPLANT
TOWEL OR 17X26 10 PK STRL BLUE (TOWEL DISPOSABLE) ×2 IMPLANT
WATER STERILE IRR 1000ML POUR (IV SOLUTION) ×2 IMPLANT

## 2012-01-29 NOTE — Anesthesia Postprocedure Evaluation (Signed)
Anesthesia Post Note  Patient: William Luna  Procedure(s) Performed: Procedure(s) (LRB): LUMBAR LAMINECTOMY/DECOMPRESSION MICRODISCECTOMY (Left)  Anesthesia type: general  Patient location: PACU  Post pain: Pain level controlled  Post assessment: Patient's Cardiovascular Status Stable  Last Vitals:  Filed Vitals:   01/29/12 1459  BP: 126/81  Pulse: 69  Temp: 36.1 C  Resp: 18    Post vital signs: Reviewed and stable  Level of consciousness: sedated  Complications: No apparent anesthesia complications

## 2012-01-29 NOTE — Anesthesia Preprocedure Evaluation (Addendum)
Anesthesia Evaluation  Patient identified by MRN, date of birth, ID band Patient awake    Reviewed: Allergy & Precautions, H&P , NPO status , Patient's Chart, lab work & pertinent test results  Airway Mallampati: II TM Distance: >3 FB Neck ROM: Full    Dental  (+) Teeth Intact and Dental Advisory Given   Pulmonary          Cardiovascular     Neuro/Psych Anxiety  Neuromuscular disease    GI/Hepatic GERD-  Controlled,  Endo/Other  Hypothyroidism   Renal/GU      Musculoskeletal   Abdominal   Peds  Hematology   Anesthesia Other Findings   Reproductive/Obstetrics                           Anesthesia Physical Anesthesia Plan  ASA: II  Anesthesia Plan: General   Post-op Pain Management:    Induction: Intravenous  Airway Management Planned: Oral ETT  Additional Equipment:   Intra-op Plan:   Post-operative Plan: Extubation in OR  Informed Consent: I have reviewed the patients History and Physical, chart, labs and discussed the procedure including the risks, benefits and alternatives for the proposed anesthesia with the patient or authorized representative who has indicated his/her understanding and acceptance.     Plan Discussed with: CRNA and Surgeon  Anesthesia Plan Comments:         Anesthesia Quick Evaluation

## 2012-01-29 NOTE — H&P (Signed)
William Luna is an 61 y.o. male.   Chief Complaint: Back and left leg pain HPI: William Luna is a 61 year old male who has had previous difficulties with back pain and left lumbar radiculopathy. He has had previous epidural steroid injections which have given him relief back in October. This past February he went skiing he noted that he felt well during the day but in the evenings he would get pain in the left buttock and left lower extremity. When he returned from a ski trip this past week the pain got much more severe. He has been unable to stand up and walk straight. He is seen by Jayme Cloud a few days ago and it was noted that he had no ankle reflex. His strength seemed good however on today's examination the patient is not able to elevate onto the gastroc of the left foot. No fasciculations are noted. He has great difficulty standing straight and erect unit for brief periods of time. Is walking extremely antalgic Lee on the left lower extremity. An MRI done this morning demonstrates the previously herniated disc herniation at L5-S1 has now enlarged to compress the left S1 nerve root in light of his clinical situation the fact that an epidural steroid injection has not improved his pain to any degree his been advised regarding surgical decompression at L5-S1 on the left side area  Past Medical History  Diagnosis Date  . Hypothyroidism   . Anxiety   . HYPERLIPIDEMIA, MILD 11/17/2008  . ANEMIA, IRON DEFICIENCY 11/17/2008  . GERD 11/17/2008  . CERVICAL RADICULOPATHY 11/17/2008  . TRANSAMINASES, SERUM, ELEVATED 11/17/2008  . DEGENERATIVE JOINT DISEASE, LUMBAR SPINE 11/17/2008    Past Surgical History  Procedure Date  . Back surgery     CERVICAL DISC  . Hemiglossectomy 10/30/2011    Procedure: HEMIGLOSSECTOMY;  Surgeon: [name];  Location: [facility] OR;  Service: ENT;  Laterality: Right;  Right tongue resection  . Radical neck dissection 10/30/2011    Procedure: RADICAL NECK DISSECTION;  Surgeon: [name];  Location: [facility] OR;  Service: ENT;  Laterality: Right;    No family history on file. Social History:  reports that he has never smoked. He has never used smokeless tobacco. He reports that he drinks alcohol. He reports that he does not use illicit drugs.  Allergies: No Known Allergies  Medications Prior to Admission  Medication Dose Route Frequency Provider Last Rate Last Dose  . ceFAZolin (ANCEF) 1-5 GM-% IVPB           . ceFAZolin (ANCEF) IVPB 1 g/50 mL premix  1 g Intravenous 60 min Pre-Op Barnett Abu, MD      . mupirocin ointment (BACTROBAN) 2 %           . mupirocin ointment (BACTROBAN) 2 %   Nasal BID Barnett Abu, MD       Medications Prior to Admission  Medication Sig Dispense Refill  . aspirin EC 81 MG tablet Take 81 mg by mouth daily.        Marland Kitchen atorvastatin (LIPITOR) 10 MG tablet Take 1 tablet (10 mg total) by mouth daily.  90 tablet  3  . levothyroxine (SYNTHROID, LEVOTHROID) 112 MCG tablet Take 1 tablet (112 mcg total) by mouth daily.  90 tablet  3  . predniSONE (DELTASONE) 20 MG tablet Take as directed.  25 tablet  1  . sertraline (ZOLOFT) 50 MG tablet Take 50-75 mg by mouth daily. Pt alternates between 50 mg (1 tab) one day and  75 mg (1 1/2 tabs) the next day         Results for orders placed during the hospital encounter of 01/29/12 (from the past 48 hour(s))  CBC     Status: Abnormal   Collection Time   01/29/12  2:51 PM      Component Value Range Comment   WBC 12.9 (*) 4.0 - 10.5 (K/uL)    RBC 5.79  4.22 - 5.81 (MIL/uL)    Hemoglobin 16.6  13.0 - 17.0 (g/dL)    HCT 16.1  09.6 - 04.5 (%)    MCV 85.1  78.0 - 100.0 (fL)    MCH 28.7  26.0 - 34.0 (pg)    MCHC 33.7  30.0 - 36.0 (g/dL)    RDW 40.9  81.1 - 91.4 (%)    Platelets 218  150 - 400 (K/uL)   BASIC METABOLIC PANEL     Status: Normal   Collection Time   01/29/12  2:51 PM      Component Value Range Comment   Sodium 137  135 - 145 (mEq/L)    Potassium 3.6  3.5 - 5.1 (mEq/L)    Chloride 97  96 - 112  (mEq/L)    CO2 28  19 - 32 (mEq/L)    Glucose, Bld 96  70 - 99 (mg/dL)    BUN 18  6 - 23 (mg/dL)    Creatinine, Ser 7.82  0.50 - 1.35 (mg/dL)    Calcium 95.6  8.4 - 10.5 (mg/dL)    GFR calc non Af Amer >90  >90 (mL/min)    GFR calc Af Amer >90  >90 (mL/min)   SURGICAL PCR SCREEN     Status: Normal   Collection Time   01/29/12  2:59 PM      Component Value Range Comment   MRSA, PCR NEGATIVE  NEGATIVE     Staphylococcus aureus NEGATIVE  NEGATIVE     Mr Lumbar Spine Wo Contrast  01/29/2012  *RADIOLOGY REPORT*  Clinical Data: Increasingly severe left leg pain with numbness in the left toes.  Little response to transforaminal steroid injection via the S1 foramen. 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  MRI LUMBAR SPINE WITHOUT CONTRAST  Technique:  Multiplanar and multiecho pulse sequences of the lumbar spine were obtained without intravenous contrast.  Comparison: Report from previous MRI at [facility] in 2012.  Findings: Anatomic alignment is present.  There is disc desiccation at L3-4 and L5-S1 with slight loss of disc height.  The alignment is anatomic.  The marrow signal is homogeneous without evidence for focal areas of marrow replacement. There is no evidence for metastatic disease.  Visualized paraspinous and retroperitoneal soft tissues are unremarkable.  Conus medullaris is within normal limits.  The individual disc spaces are examined as follows:  L1-2:  Normal disc space.  L2-3:  Normal disc space.  L3-4:  Mild disc desiccation.  Minimal annular bulging.  Minimal facet arthropathy.  No stenosis or disc protrusion.  L4-5:  Shallow central protrusion.  Mild to moderate facet arthropathy.  No L5 nerve root compressive lesion. Mild to moderate bilateral neural foraminal narrowing without clear-cut encroachment on the L4 nerve roots.  L5-S1:  Moderate facet arthropathy.  Central disc extrusion extends much more to the left than right. There is significant displacement  of the thecal sac and left S1 nerve root.  Bilateral neural foraminal narrowing relates to slight loss of interspace height, facet overgrowth, and slight disc material.  Bilateral neural foraminal narrowing, worse on the left, could affect either L5 nerve root.  Attempts to load the previous study were unsuccessful. By report, the disc extrusion at L5-S1 on the left is a new/worse finding. The appearance at L3-4 and L4-5 is likely stable.  IMPRESSION: Central and leftward disc extrusion at L5-S1 displaces the thecal sac and left S1 nerve root.  Bilateral neural foraminal narrowing at the L5-S1 level potentially affects both L5 nerve roots, also worse on the left.  Relatively minor disc pathology and facet arthropathy at L3-4 and L4-5 do not clearly result in neural compression, and are likely stable from previous examination.  Original Report Authenticated By: Elsie Stain, M.D.    Review of Systems  Constitutional: Negative.   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   Respiratory: Negative.   Cardiovascular: Negative.   Gastrointestinal: Negative.   Genitourinary: Negative.   Musculoskeletal: Positive for back pain.  Neurological: Positive for focal weakness.       Weak and gastroc on the left side  Endo/Heme/Allergies: Negative.   Psychiatric/Behavioral: Negative for depression, suicidal ideas, hallucinations and substance abuse. The patient is not nervous/anxious.     Blood pressure 126/81, pulse 69, temperature 96.9 F (36.1 C), temperature source Oral, resp. rate 18, height 5\' 8"  (1.727 m), weight 70.308 kg (155 lb), SpO2 96.00%. Physical Exam  Constitutional: He appears well-developed and well-nourished.  HENT:  Head: Normocephalic and atraumatic.       Recent surgery on right neck to remove cancer on tongue  Eyes: Conjunctivae and EOM are normal. Pupils are equal, round, and reactive to light.  Neck: Neck supple. No tracheal deviation  present. No thyromegaly present.  Musculoskeletal: He exhibits no edema and no tenderness.     Assessment/Plan Herniated nucleus pulposus L5-S1 left with left lumbar radiculopathy. Plan: Microdiscectomy L5-S1 left with operating microscope  Jon Kasparek J 01/29/2012, 8:23 PM

## 2012-01-29 NOTE — Preoperative (Signed)
Beta Blockers   Reason not to administer Beta Blockers:Not Applicable 

## 2012-01-29 NOTE — Op Note (Signed)
Preoperative diagnosis: Herniated nucleus pulposus L5-S1 left with left lumbar radiculopathy Postoperative diagnosis: Herniated nucleus pulposus L5-S1 left with left lumbar radiculopathy Procedure: Microdiscectomy L5-S1 left with operating microscope microdissection technique Surgeon: Barnett Abu M.D. Assistant: Barbaraann Barthel M.D. Indications: Patient is a 61 year old individual who developed acute and severe left lower extremity pain with weakness in his gastroc he underwent a transforaminal epidural steroid injection that did not help his pain. He is now taken to the operating room to undergo surgical decompression at L5-S1 on the left.  Procedure: The patient was brought to the operating room supine on a stretcher. After the smooth induction of general endotracheal anesthesia patient was carefully turned to the prone position with the bony prominences being appropriately padded and protected. The lower lumbar spine was prepped with alcohol and DuraPrep and then draped in a sterile fashion. The needle was used to localize the area of L5-S1 and a radiograph was obtained demonstrating a needle at the level of L5-S1. Skin was infiltrated with 10 cc of lidocaine 1-100,000 epinephrine mixed 50-50 with half percent Marcaine. An incision was made and carried down to the lumbar dorsal fascia the fascia was opened on the left side of the midline and a subperiosteal dissection was performed in the interlaminar space of L5-S1. A self-retaining retractor was then placed into the wound. The yellow ligament was taken up from the interlaminar space at L5-S1 and the inferior margin of the lamina of L5 was removed and a partial facetectomy was performed at L5-S1. The common dural tube was explored and the take off of the S1 nerve root was identified and gradually mobilized. The nerve root root was raised slightly and underneath this area there was found to be a free fragment of disc that had herniated from a more chronic  disc herniation that was present hours the midline. This was removed then in a piecemeal fashion a number of other fragments were removed from the opening into the interspace. A significant number of fragments were encountered and dissection of the medial aspect of the chronic disc herniation was undertaken with careful dissection of the dura away from the adherent previously inflamed posterior longitudinal ligament the disc was removed in fragments and the disc space was entered and gradually evacuated of substantial quantity of severely degenerated and desiccated disc material. The medial and lateral aspects of the interspace was then cleared. The retraction of the common dural tube and the S1 nerve root was made possible by Dr. Mikal Plane gently retracting and elevating the S1 nerve root so I could inspect the more medial portion of the disc space is neuro a discectomy as could be appreciated through this aperture was performed. In the end hemostasis and the soft tissues was obtained with the bipolar cautery some small pledgets of Gelfoam which were later here gated away.  After adequate decompression was obtained hemostasis and the soft tissues was verified retractor was removed the operating microscope was removed from the field and the lumbar dorsal fascia was closed with #1 and 2-0 Vicryl in interrupted fashion. 3-0 Vicryl was used to close subcuticular skin. Blood loss was estimated at 75 cc.

## 2012-01-29 NOTE — Transfer of Care (Signed)
Immediate Anesthesia Transfer of Care Note  Patient: William Luna  Procedure(s) Performed: Procedure(s) (LRB): LUMBAR LAMINECTOMY/DECOMPRESSION MICRODISCECTOMY (Left)  Patient Location: PACU  Anesthesia Type: General  Level of Consciousness: awake and alert   Airway & Oxygen Therapy: Patient Spontanous Breathing and Patient connected to nasal cannula oxygen  Post-op Assessment: Report given to PACU RN, Post -op Vital signs reviewed and stable and Patient moving all extremities  Post vital signs: Reviewed and stable  Complications: No apparent anesthesia complications

## 2012-01-30 ENCOUNTER — Observation Stay (HOSPITAL_COMMUNITY): Payer: 59

## 2012-01-30 MED ORDER — DIAZEPAM 5 MG PO TABS: 5.0000 mg | ORAL_TABLET | Freq: Four times a day (QID) | ORAL | Status: DC | PRN

## 2012-01-30 MED ORDER — ALUM & MAG HYDROXIDE-SIMETH 200-200-20 MG/5ML PO SUSP: 30.0000 mL | Freq: Four times a day (QID) | ORAL | Status: DC | PRN

## 2012-01-30 MED ORDER — SERTRALINE HCL 25 MG PO TABS
75.0000 mg | ORAL_TABLET | ORAL | Status: DC
  Filled 2012-01-30: qty 3

## 2012-01-30 MED ORDER — ACETAMINOPHEN 325 MG PO TABS
650.0000 mg | ORAL_TABLET | ORAL | Status: DC | PRN
Start: 2012-01-30 — End: 2012-01-30

## 2012-01-30 MED ORDER — SENNA 8.6 MG PO TABS
1.0000 | ORAL_TABLET | Freq: Two times a day (BID) | ORAL | Status: DC
  Filled 2012-01-30 (×2): qty 1

## 2012-01-30 MED ORDER — PHENOL 1.4 % MT LIQD: 1.0000 | OROMUCOSAL | Status: DC | PRN

## 2012-01-30 MED ORDER — SODIUM CHLORIDE 0.9 % IV SOLN: 250.0000 mL | INTRAVENOUS | Status: DC

## 2012-01-30 MED ORDER — LEVOTHYROXINE SODIUM 112 MCG PO TABS
112.0000 ug | ORAL_TABLET | Freq: Every day | ORAL | Status: DC
  Administered 2012-01-30: 112 ug via ORAL
  Filled 2012-01-30 (×2): qty 1

## 2012-01-30 MED ORDER — DIAZEPAM 5 MG PO TABS: 5.0000 mg | ORAL_TABLET | Freq: Four times a day (QID) | ORAL | Status: AC | PRN

## 2012-01-30 MED ORDER — OXYCODONE-ACETAMINOPHEN 5-325 MG PO TABS: 1.0000 | ORAL_TABLET | ORAL | Status: DC | PRN

## 2012-01-30 MED ORDER — ACETAMINOPHEN 650 MG RE SUPP: 650.0000 mg | RECTAL | Status: DC | PRN

## 2012-01-30 MED ORDER — ATORVASTATIN CALCIUM 10 MG PO TABS
10.0000 mg | ORAL_TABLET | Freq: Every day | ORAL | Status: DC
  Filled 2012-01-30: qty 1

## 2012-01-30 MED ORDER — MENTHOL 3 MG MT LOZG: 1.0000 | LOZENGE | OROMUCOSAL | Status: DC | PRN

## 2012-01-30 MED ORDER — KETOROLAC TROMETHAMINE 15 MG/ML IJ SOLN
15.0000 mg | Freq: Four times a day (QID) | INTRAMUSCULAR | Status: DC
  Administered 2012-01-30: 15 mg via INTRAVENOUS
  Filled 2012-01-30 (×5): qty 1

## 2012-01-30 MED ORDER — ONDANSETRON HCL 4 MG/2ML IJ SOLN: 4.0000 mg | INTRAMUSCULAR | Status: DC | PRN

## 2012-01-30 MED ORDER — MORPHINE SULFATE 4 MG/ML IJ SOLN: 1.0000 mg | INTRAMUSCULAR | Status: DC | PRN

## 2012-01-30 MED ORDER — SODIUM CHLORIDE 0.9 % IJ SOLN: 3.0000 mL | INTRAMUSCULAR | Status: DC | PRN

## 2012-01-30 MED ORDER — SERTRALINE HCL 50 MG PO TABS: 50.0000 mg | ORAL_TABLET | ORAL | Status: DC

## 2012-01-30 MED ORDER — SODIUM CHLORIDE 0.9 % IJ SOLN: 3.0000 mL | Freq: Two times a day (BID) | INTRAMUSCULAR | Status: DC

## 2012-01-30 NOTE — Progress Notes (Signed)
CARE MANAGEMENT NOTE 01/30/2012  Action/Plan:   No HH needs Identified.   Anticipated DC Date:  01/30/2012   Anticipated DC Plan:  HOME/SELF CARE  In-house referral  NA      DC Planning Services  CM consult      PAC Choice  NA   Choice offered to / List presented to:  NA   DME arranged  NA      DME agency  NA     HH arranged  NA      HH agency  NA   Status of service:  Completed, signed off  Discharge Disposition:  HOME/SELF CARE

## 2012-01-30 NOTE — Discharge Summary (Signed)
Physician Discharge Summary  Patient ID: William Luna MRN: [id number] DOB/AGE: [birth year] 63 y.o.  Admit date: 01/29/2012 Discharge date: 01/30/2012  Admission Diagnoses: Herniated nucleus pulposus L5-S1 left with left lumbar radiculopathy  Discharge Diagnoses: Herniated nucleus pulposus L5-S1 left with left lumbar radiculopathy Active Problems:  * No active hospital problems. *    Discharged Condition: good  Hospital Course: Patient was admitted to undergo surgical decompression L5-S1 on the left side. Surgery was tolerated well the patient is ambulatory. He still has some weakness in the gastroc on the left side the pain is much improved area  Consults: None  Significant Diagnostic Studies: MRI lumbar 01/29/2012  Treatments: surgery: Microdiscectomy L5-S1 left with operating microscope microdissection technique  Discharge Exam: Blood pressure 121/74, pulse 76, temperature 98.6 F (37 C), temperature source Oral, resp. rate 20, height 5\' 8"  (1.727 m), weight 70.308 kg (155 lb), SpO2 98.00%. Incisions clean and dry motor function good in lower extremities with gastroc on the left weak at 4/5  Disposition: 01-Home or Self Care  Discharge Orders    Future Orders Please Complete By Expires   Diet - low sodium heart healthy      Increase activity slowly      Discharge instructions      Comments:   Sit straight walk straight stand straight, mind your posture. Okay to shower do not apply salves or ointments to incision. No lifting greater than 10 pounds. May resume driving when not requiring pain medication or Valium.   Call MD for:  temperature >100.4      Call MD for:  severe uncontrolled pain      Call MD for:  redness, tenderness, or signs of infection (pain, swelling, redness, odor or green/yellow discharge around incision site)        Medication List  As of 01/30/2012  8:43 AM   TAKE these medications         aspirin EC 81 MG tablet   Take 81 mg by mouth daily.     atorvastatin 10 MG tablet   Commonly known as: LIPITOR   Take 1 tablet (10 mg total) by mouth daily.      diazepam 5 MG tablet   Commonly known as: VALIUM   Take 1 tablet (5 mg total) by mouth every 6 (six) hours as needed (Muscle spasms).      levothyroxine 112 MCG tablet   Commonly known as: SYNTHROID, LEVOTHROID   Take 1 tablet (112 mcg total) by mouth daily.      sertraline 50 MG tablet   Commonly known as: ZOLOFT   Take 50-75 mg by mouth daily. Pt alternates between 50 mg (1 tab) one day and 75 mg (1 1/2 tabs) the next day         ASK your doctor about these medications         predniSONE 20 MG tablet   Commonly known as: DELTASONE   Take as directed.           Follow-up Information    Follow up with Stefani Dama, MD. Schedule an appointment as soon as possible for a visit in 3 weeks. (Call Aram Beecham for appointment)    Contact information:   [address] [place] [address] [phone]          Signed: Stefani Dama 01/30/2012, 8:43 AM

## 2012-01-30 NOTE — Discharge Instructions (Signed)
Herniated Disk The bones of your spinal column (vertebrae) protect your spinal cord and nerves that go into your arms and legs. The vertebrae are separated by disks that cushion the spinal column and put space between your vertebrae. This allows movement between the vertebrae, which allows you to bend, rotate, and move your body from side to side. Sometimes, the disks move out of place (herniate) or break open (rupture) from injury or strain. The most common area for a disk herniation is in the lower back (lumbar area). Sometimes herniation occurs in the neck (cervical) disks.  CAUSES  As we grow older, the strong, fibrous cords that connect the vertebrae and support and surround the disks (ligaments) start to weaken. A strain on the back may cause a break in the disk ligaments. RISK FACTORS Herniated disks occur most often in men who are aged 18 years to 35 years, usually after strenuous activity. Other risk factors include conditions present at birth (congenital) that affect the size of the lumbar spinal canal. Additionally, a narrowing of the areas where the nerves exit the spinal canal can occur as you age. SYMPTOMS  Symptoms of a herniated disk vary. You may have weakness in certain muscles. This weakness can include difficulty lifting your leg or arm, difficulty standing on your toes on one side, or difficulty squeezing tightly with one of your hands. You may have numbness. You may feel a mild tingling, dull ache, or a burning or pulsating pain. In some cases, the pain is severe enough that you are unable to move. The pain most often occurs on one side of the body. The pain often starts slowly. It may get worse:  After you sit or stand.   At night.   When you sneeze, cough, or laugh.   When you bend backwards or walk more than a few yards.  The pain, numbness, or weakness will often go away or improve a lot over a period of weeks to months. Herniated lumbar disk Symptoms of a herniated  lumbar disk may include sharp pain in one part of your leg, hip, or buttocks and numbness in other parts. You also may feel pain or numbness on the back of your calf or the top or sole of your foot. The same leg also may feel weak. Herniated cervical disk Symptoms of a herniated cervical disk may include pain when you move your neck, deep pain near or over your shoulder blade, or pain that moves to your upper arm, forearm, or fingers. DIAGNOSIS  To diagnose a herniated disk, your caregiver will perform a physical exam. Your caregiver also may perform diagnostic tests to see your disk or to test the reaction of your muscles and the function of your nerves. During the physical exam, your caregiver may ask you to:  Sit, stand, and walk. While you walk, your caregiver may ask you to try walking on your toes and then your heels.   Bend forward, backward, and sideways.   Raise your shoulders, elbow, wrist, and fingers and check your strength during these tasks.  Your caregiver will check for:  Numbness or loss of feeling.   Muscle reflexes, which may be slower or missing.   Muscle strength, which may be weaker.   Posture or the way your spine curves.  Diagnostic tests that may be done include:  A spinal X-ray exam to rule out other causes of back pain.   Magnetic resonance imaging (MRI) or computed tomography (CT) scan, which will show   if the herniated disk is pressing on your spinal canal.   Electromyography. This is sometimes used to identify the specific area of nerve involvement.  TREATMENT  Initial treatment for a herniated disk is a short period of rest with medicines for pain. Pain medicines can include nonsteroidal anti-inflammatory medicines (NSAIDs), muscle relaxants for back spasms, and (rarely) narcotic pain medicine for severe pain that does not respond to NSAID use. Bed rest is often limited to 1 or 2 days at the most because prolonged rest can delay recovery. When the herniation  involves the lower back, sitting should be avoided as much as possible because sitting increases pressure on the ruptured disk. Sometimes a soft neck collar will be prescribed for a few days to weeks to help support your neck in the case of a cervical herniation. Physical therapy is often prescribed for patients with disk disease. Physical therapists will teach you how to properly lift, dress, walk, and perform other activities. They will work on strengthening the muscles that help support your spine. In some cases, physical therapy alone is not enough to treat a herniated disk. Steroid injections along the involved nerve root may be needed to help control pain. The steroid is injected in the area of the herniated disk and helps by reducing swelling around the disk. Sometimes surgery is the best option to treat a herniated disk.  SEEK IMMEDIATE MEDICAL CARE IF:   You have numbness, tingling, weakness, or problems with the use of your arms or legs.   You have severe headaches that are not relieved with the use of medicines.   You notice a change in your bowel or bladder control.   You have increasing pain in any areas of your body.   You experience shortness of breath, dizziness, or fainting.  MAKE SURE YOU:   Understand these instructions.   Will watch your condition.   Will get help right away if you are not doing well or get worse.  Document Released: 11/01/2000 Document Revised: 10/24/2011 Document Reviewed: 06/07/2011 [employer] Patient Information 2012 [employer]. 

## 2012-02-03 ENCOUNTER — Encounter (HOSPITAL_COMMUNITY): Payer: Self-pay | Admitting: Neurological Surgery

## 2012-03-26 ENCOUNTER — Other Ambulatory Visit: Payer: Self-pay | Admitting: Neurological Surgery

## 2012-03-26 DIAGNOSIS — M545 Low back pain: Secondary | ICD-10-CM

## 2012-03-27 ENCOUNTER — Ambulatory Visit
Admission: RE | Admit: 2012-03-27 | Discharge: 2012-03-27 | Disposition: A | Discharge status: Home / Self Care | Payer: 59 | Source: Ambulatory Visit | Attending: Neurological Surgery | Admitting: Neurological Surgery

## 2012-03-27 DIAGNOSIS — M545 Low back pain: Secondary | ICD-10-CM

## 2012-03-27 MED ORDER — GADOBENATE DIMEGLUMINE 529 MG/ML IV SOLN
15.0000 mL | Freq: Once | INTRAVENOUS | Status: AC | PRN
  Administered 2012-03-27: 15 mL via INTRAVENOUS

## 2012-04-01 ENCOUNTER — Other Ambulatory Visit: Payer: 59

## 2012-04-11 ENCOUNTER — Encounter: Payer: Self-pay | Admitting: Cardiology

## 2012-04-12 ENCOUNTER — Encounter: Payer: Self-pay | Admitting: Cardiology

## 2012-05-23 ENCOUNTER — Other Ambulatory Visit: Payer: Self-pay | Admitting: Internal Medicine

## 2012-05-25 ENCOUNTER — Other Ambulatory Visit: Payer: Self-pay | Admitting: General Practice

## 2012-05-25 MED ORDER — SERTRALINE HCL 50 MG PO TABS: 50.0000 mg | ORAL_TABLET | Freq: Every day | ORAL | Status: DC

## 2012-06-16 ENCOUNTER — Encounter: Payer: Self-pay | Admitting: Internal Medicine

## 2012-06-16 ENCOUNTER — Ambulatory Visit (INDEPENDENT_AMBULATORY_CARE_PROVIDER_SITE_OTHER): Payer: 59 | Admitting: Internal Medicine

## 2012-06-16 VITALS — BP 106/68 | HR 71 | Temp 98.1°F | Resp 16 | Wt 156.0 lb

## 2012-06-16 DIAGNOSIS — F411 Generalized anxiety disorder: Secondary | ICD-10-CM

## 2012-06-16 DIAGNOSIS — K219 Gastro-esophageal reflux disease without esophagitis: Secondary | ICD-10-CM

## 2012-06-16 DIAGNOSIS — E063 Autoimmune thyroiditis: Secondary | ICD-10-CM

## 2012-06-16 DIAGNOSIS — M479 Spondylosis, unspecified: Secondary | ICD-10-CM

## 2012-06-16 DIAGNOSIS — C029 Malignant neoplasm of tongue, unspecified: Secondary | ICD-10-CM

## 2012-06-16 DIAGNOSIS — Z0000 Encounter for general adult medical examination without abnormal findings: Secondary | ICD-10-CM

## 2012-06-16 DIAGNOSIS — M5412 Radiculopathy, cervical region: Secondary | ICD-10-CM

## 2012-06-16 DIAGNOSIS — E785 Hyperlipidemia, unspecified: Secondary | ICD-10-CM

## 2012-06-16 NOTE — Progress Notes (Signed)
Subjective:    Patient ID: William Luna, male    DOB: 08/03/1951, 61 y.o.   MRN: [id number]  HPI The patient is here for annual  wellness examination and management of other chronic and acute problems.   He is doing well. Has a little persistent problem with left leg symptoms from L5-S1 disk - mild paresthesia lateral foot/toes. He has recovered from several surgeries the past 12 months (see PMH)   The risk factors are reflected in the social history.  The roster of all physicians providing medical care to patient - is listed in the Snapshot section of the chart.  Activities of daily living:  The patient is 100% inedpendent in all ADLs: dressing, toileting, feeding as well as independent mobility  Home safety : The patient has smoke detectors in the home. Fall - home is fall safe. They wear seatbelts.No firearms at home. There is no violence in the home.   There is no risks for hepatitis, STDs or HIV. There is no  history of blood transfusion. They have no travel history to infectious disease endemic areas of the world.  The patient has seen their dentist in the last six month. They have seen their eye doctor in the last year. 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 He does not require any amplification.   They do not  have excessive sun exposure. Discussed the need for sun protection: hats, long sleeves and use of sunscreen if there is significant sun exposure.   Diet: the importance of a healthy diet is discussed. They do have a healthy diet.  The patient has a regular exercise program: elipitical, push mower, biking , 30+ minutes duration, 3 per week.  The benefits of regular aerobic exercise were discussed.  Depression screen: there are no signs or vegative symptoms of depression- irritability, change in appetite, anhedonia, sadness/tearfullness.  Cognitive assessment: the  patient manages all their financial and personal affairs and is actively engaged.   The following portions of the patient's history were reviewed and updated as appropriate: allergies, current medications, past family history, past medical history,  past surgical history, past social history  and problem list.  Vision, hearing, body mass index were assessed and reviewed.  Past Medical History  Diagnosis Date  . Hypothyroidism   . Anxiety   . ANEMIA, IRON DEFICIENCY 11/17/2008  . CERVICAL RADICULOPATHY 11/17/2008  . TRANSAMINASES, SERUM, ELEVATED 11/17/2008  . DEGENERATIVE JOINT DISEASE, LUMBAR SPINE 11/17/2008    minor  based on MRI, asymptomatic  . GERD 11/17/2008    EGD negative x 3, off medication (July '13)  . HYPERLIPIDEMIA, MILD 11/17/2008    lipitor 10mg    . 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 DDD (degenerative disc disease), lumbar oct '12    L4-5, ESI therapy successful  . DDD (degenerative disc disease) march '13    L5-S1 disk with radicular symptoms   Past Surgical History  Procedure Date  . Back surgery '90s ([place])    CERVICAL DISC - diskectomy w/ fusion, anterior  . Hemiglossectomy 10/30/2011    Procedure: HEMIGLOSSECTOMY;  Surgeon: [name];  Location: [facility] OR;  Service: ENT;  Laterality: Right;  Right tongue resection  . Radical neck dissection 10/30/2011    Procedure: RADICAL NECK DISSECTION;  Surgeon: Margit Banda  [name];  Location: [facility] OR;  Service: ENT;  Laterality: Right;  . Lumbar laminectomy/decompression microdiscectomy 01/29/2012    Procedure: LUMBAR LAMINECTOMY/DECOMPRESSION MICRODISCECTOMY;  Surgeon: Barnett Abu, MD;  Location: [facility] OR[facility];  Service: Neurosurgery;  Laterality: Left;  Left L5-S1 Microdiskectomy  . Nail avulsion     right first finger due to a wart   Family History  Problem Relation Age of Onset  . Diabetes Mother   . Anemia Mother    . Cirrhosis Mother   . Hyperlipidemia Father   . Hypertension Father   . Heart disease Father   . Cancer Maternal Grandfather     colon  . COPD Neg Hx    History   Social History  . Marital Status: Married    Spouse Name: [name]    Number of Children: 2  . Years of Education: 27   Occupational History  . Cardiologist [employer]   Social History Main Topics  . Smoking status: Never Smoker   . Smokeless tobacco: Never Used  . Alcohol Use: Yes     OCC.  . Drug Use: No  . Sexually Active: Yes -- Male partner(s)    Birth Control/ Protection: None   Other Topics Concern  . Not on file   Social History Narrative   [employer] [ [place];; scholar}; [place] [employer],   [employer] residency/fellowship. Married- 1977 - 25 years/divorced;  Married 2006. 1 son  51, 1 daughter - 110; step-son 30,    Step-daughter 23. Grandchildren - 2 ('2008, 2013). Work - Building surveyor. ACP/Living will:  Wife  with healthcare POA; Yes - CPR, Yes - short-term mechanical ventilation,  no futile care.       Current Outpatient Prescriptions on File Prior to Visit  Medication Sig Dispense Refill  . aspirin EC 81 MG tablet Take 81 mg by mouth daily.        Marland Kitchen atorvastatin (LIPITOR) 10 MG tablet Take 1 tablet (10 mg total) by mouth daily.  90 tablet  3  . levothyroxine (SYNTHROID, LEVOTHROID) 112 MCG tablet Take 1 tablet (112 mcg total) by mouth daily.  90 tablet  3  . sertraline (ZOLOFT) 50 MG tablet Take 1-1.5 tablets (50-75 mg total) by mouth daily. Pt alternates between 50 mg (1 tab) one day and 75 mg (1 1/2 tabs) the next day  60 tablet  1  . sertraline (ZOLOFT) 50 MG tablet take 1 tablet by mouth once daily ALTERNATE with 1 and 1/2 tablets once daily  45 tablet  1  . DISCONTD: sertraline (ZOLOFT) 50 MG tablet Take 1 tablet (50 mg total) by mouth daily.  45 tablet  6   Current Outpatient Prescriptions on File Prior to Visit  Medication Sig Dispense Refill  . aspirin EC 81 MG  tablet Take 81 mg by mouth daily.        Marland Kitchen atorvastatin (LIPITOR) 10 MG tablet Take 1 tablet (10 mg total) by mouth daily.  90 tablet  3  . levothyroxine (SYNTHROID, LEVOTHROID) 112 MCG tablet Take 1 tablet (112 mcg total) by mouth daily.  90 tablet  3  . sertraline (ZOLOFT) 50 MG tablet Take 1-1.5 tablets (50-75 mg total) by mouth daily. Pt alternates between 50 mg (1 tab) one day and 75 mg (1 1/2 tabs) the next day  60 tablet  1  . sertraline (ZOLOFT) 50 MG tablet take 1 tablet by mouth once daily ALTERNATE with 1 and 1/2 tablets once daily  45 tablet  1  .  DISCONTD: sertraline (ZOLOFT) 50 MG tablet Take 1 tablet (50 mg total) by mouth daily.  45 tablet  6     During the course of the visit the patient was educated and counseled about appropriate screening and preventive services including : fall prevention , diabetes screening, nutrition counseling, colorectal cancer screening, and recommended immunizations.    Review of Systems Constitutional:  Negative for fever, chills, activity change and unexpected weight change.  HEENT:  Negative for hearing loss, ear pain, congestion, neck stiffness and postnasal drip. Negative for sore throat or swallowing problems. Negative for dental complaints.   Eyes: Negative for vision loss or change in visual acuity.  Respiratory: Negative for chest tightness and wheezing. Negative for DOE.   Cardiovascular: Negative for chest pain or palpitations. No decreased exercise tolerance Gastrointestinal: No change in bowel habit. No bloating or gas. No reflux or indigestion Genitourinary: Negative for urgency, frequency, flank pain and difficulty urinating.  Musculoskeletal: Negative for myalgias, back pain, arthralgias and gait problem.  Neurological: Negative for dizziness, tremors, weakness and headaches.  Hematological: Negative for adenopathy.  Psychiatric/Behavioral: Negative for behavioral problems and dysphoric mood.       Objective:   Physical  Exam Filed Vitals:   06/16/12 0926  BP: 106/68  Pulse: 71  Temp: 98.1 F (36.7 C)  Resp: 16   Wt Readings from Last 3 Encounters:  06/16/12 156 lb (70.761 kg)  01/29/12 155 lb (70.308 kg)  01/29/12 155 lb (70.308 kg)   Gen'l: Well nourished well developed whie male in no acute distress  HEENT: Head: Normocephalic and atraumatic. Right Ear: External ear normal. EAC/TM nl. Left Ear: External ear normal.  EAC/TM nl. Nose: Nose normal. Mouth/Throat: Oropharynx is clear and moist. Dentition - native, in good repair. No buccal or palatal lesions. Tongue w/o visible scar but subtle loss of tissue bulk right, decreased ability to protrude or sweep the left gum/buccal membrane. Posterior pharynx clear. Eyes: Conjunctivae and sclera clear. EOM intact. Pupils are equal, round, and reactive to light. Right eye exhibits no discharge. Left eye exhibits no discharge. Neck: Normal range of motion. Neck supple. No JVD present. No tracheal deviation present. No thyromegaly present. Well healed semi-circular surgical scar eight neck. Cardiovascular: Normal rate, regular rhythm, no gallop, no friction rub, no murmur heard.      Quiet precordium. 2+ radial and DP pulses . No carotid bruits Pulmonary/Chest: Effort normal. No respiratory distress or increased WOB, no wheezes, no rales. No chest wall deformity or CVAT. Abdomen: Soft. Bowel sounds are normal in all quadrants. He exhibits no distension, no tenderness, no rebound or guarding, No heptosplenomegaly  Genitourinary: deferred   Musculoskeletal: Normal range of motion. He exhibits no edema and no tenderness.       Small and large joints without redness, synovial thickening or deformity. Full range of motion preserved about all small, median and large joints.  Lymphadenopathy:    He has no cervical or supraclavicular adenopathy.  Neurological: He is alert and oriented to person, place, and time. CN II-XII intact. DTRs 2+ and symmetrical biceps, radial and  patellar tendons. Cerebellar function normal with no tremor, rigidity, normal gait and station.  Skin: Skin is warm and dry. No rash noted. No erythema.  Psychiatric: He has a normal mood and affect. His behavior is normal. Thought content normal.   Labs: has had outside labs in '13: chemistry, lipids, PSA - all to be forwarded for scanning into the record.  Lab Results  Component Value Date  WBC 12.9* 01/29/2012   HGB 16.6 01/29/2012   HCT 49.3 01/29/2012   PLT 218 01/29/2012   GLUCOSE 96 01/29/2012   CHOL 134 11/16/2008   TRIG 66 11/16/2008   HDL 40.8 11/16/2008   LDLDIRECT 134.5 12/15/2006   LDLCALC 80 11/16/2008   ALT 65* 11/16/2008   AST 79* 11/16/2008   NA 137 01/29/2012   K 3.6 01/29/2012   CL 97 01/29/2012   CREATININE 0.85 01/29/2012   BUN 18 01/29/2012   CO2 28 01/29/2012   TSH 2.01 11/16/2008   PSA 0.47 11/16/2008         Assessment & Plan:

## 2012-06-18 ENCOUNTER — Encounter: Payer: Self-pay | Admitting: Internal Medicine

## 2012-06-18 DIAGNOSIS — C029 Malignant neoplasm of tongue, unspecified: Secondary | ICD-10-CM | POA: Insufficient documentation

## 2012-06-18 DIAGNOSIS — Z0000 Encounter for general adult medical examination without abnormal findings: Secondary | ICD-10-CM | POA: Insufficient documentation

## 2012-06-18 NOTE — Assessment & Plan Note (Signed)
Dec 12, ;12 mid-tongue squamous cell carcinoma -  Procedure: Right hemi-glossectomy, right supraomohyoid neck dissection Tumor excised, clean margins, 11 nodes negative, no adjuvant therapy

## 2012-06-18 NOTE — Assessment & Plan Note (Signed)
Interval medical history is remarkable for lumbar/sacral disk disease with successful interventions, squamous cell carcinoma tongue with successful intervention. At this exam he is healthy, active and doing well. Physical exam, sans rectal, is normal. Lab - outside reports pending but by his report normal. He is current with colorectal cancer screening with last exam in '07; current with surveillance for Barrett's; current with prostate cancer screening with PSA having been done in '13. Immunizations - no record of Tdap, pneumonia or shingles vaccine in EMR - all are recommended.  In summary - a delightful long term colleague who has had several major illnesses in the interval since his last exam but is currently doing well. He is investing in his health with regular exercise and does have a reasonable live-work balance with improvement coming. He will return for general exam in 18-24 months, sooner on an as needed basis.

## 2012-06-18 NOTE — Assessment & Plan Note (Signed)
Recent lab report pending. Patient reports normal LFTs

## 2012-06-18 NOTE — Assessment & Plan Note (Signed)
S/p cervical diskectomy. Does pretty well at this time - uses upright position when biking due to neck related limitation in flexibility

## 2012-06-18 NOTE — Assessment & Plan Note (Signed)
Recent labs (doctor's day) to be scanned into the chart.

## 2012-06-18 NOTE — Assessment & Plan Note (Signed)
ESI Oct 5, '12: Clinical Data: Displacement lumbar intervertebral disc without  myelopathy. Left leg radicular symptoms with a mixed left L4 and  left L5 distribution. IMPRESSION:  Satisfactory transforaminal lumbar epidural steroid injection, left  L4 and left L5 perineural.  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 No signs concerning for  Infection.  Currently doing well with normal left leg strength, ROM, sensation and reflex.

## 2012-06-18 NOTE — Assessment & Plan Note (Signed)
Mild anxiety w/ compusive tendencies well controlled with low dose sertraline

## 2012-06-18 NOTE — Assessment & Plan Note (Signed)
last E.GD 02/26/06 w/o Barrett's. At this time he is on no medication (PPIs) and has no active symptoms

## 2012-06-18 NOTE — Assessment & Plan Note (Signed)
At diagnosis was symptomatic with TSH approx 100. Excellent response to medical therapy.  Lab Results  Component Value Date   TSH 2.01 11/16/2008   More recent labs to be forwarded and scanned into chart.   Plan  Continue levothyroxine

## 2012-07-06 ENCOUNTER — Ambulatory Visit
Admission: RE | Admit: 2012-07-06 | Discharge: 2012-07-06 | Disposition: A | Discharge status: Home / Self Care | Payer: 59 | Source: Ambulatory Visit | Attending: Neurology | Admitting: Neurology

## 2012-07-06 ENCOUNTER — Other Ambulatory Visit: Payer: Self-pay | Admitting: Neurology

## 2012-07-06 DIAGNOSIS — IMO0002 Reserved for concepts with insufficient information to code with codable children: Secondary | ICD-10-CM

## 2012-07-06 MED ORDER — GADOBENATE DIMEGLUMINE 529 MG/ML IV SOLN
14.0000 mL | Freq: Once | INTRAVENOUS | Status: AC | PRN
  Administered 2012-07-06: 14 mL via INTRAVENOUS

## 2012-07-09 ENCOUNTER — Other Ambulatory Visit: Payer: Self-pay | Admitting: Neurological Surgery

## 2012-07-10 ENCOUNTER — Encounter (HOSPITAL_COMMUNITY): Payer: Self-pay | Admitting: *Deleted

## 2012-07-13 ENCOUNTER — Encounter (HOSPITAL_COMMUNITY): Payer: Self-pay | Admitting: Certified Registered Nurse Anesthetist

## 2012-07-13 ENCOUNTER — Ambulatory Visit (HOSPITAL_COMMUNITY)
Admission: RE | Admit: 2012-07-13 | Discharge: 2012-07-13 | Disposition: A | Discharge status: Home / Self Care | Payer: 59 | Source: Ambulatory Visit | Attending: Neurological Surgery | Admitting: Neurological Surgery

## 2012-07-13 ENCOUNTER — Encounter (HOSPITAL_COMMUNITY): Payer: Self-pay | Admitting: *Deleted

## 2012-07-13 ENCOUNTER — Ambulatory Visit (HOSPITAL_COMMUNITY): Payer: 59 | Admitting: Certified Registered Nurse Anesthetist

## 2012-07-13 ENCOUNTER — Ambulatory Visit (HOSPITAL_COMMUNITY): Payer: 59

## 2012-07-13 ENCOUNTER — Encounter (HOSPITAL_COMMUNITY)
Admission: RE | Disposition: A | Discharge status: Home / Self Care | Payer: Self-pay | Source: Ambulatory Visit | Attending: Neurological Surgery

## 2012-07-13 DIAGNOSIS — M5126 Other intervertebral disc displacement, lumbar region: Secondary | ICD-10-CM | POA: Insufficient documentation

## 2012-07-13 DIAGNOSIS — K219 Gastro-esophageal reflux disease without esophagitis: Secondary | ICD-10-CM | POA: Insufficient documentation

## 2012-07-13 DIAGNOSIS — E039 Hypothyroidism, unspecified: Secondary | ICD-10-CM | POA: Insufficient documentation

## 2012-07-13 DIAGNOSIS — M479 Spondylosis, unspecified: Secondary | ICD-10-CM

## 2012-07-13 DIAGNOSIS — F411 Generalized anxiety disorder: Secondary | ICD-10-CM | POA: Insufficient documentation

## 2012-07-13 HISTORY — PX: LUMBAR LAMINECTOMY/DECOMPRESSION MICRODISCECTOMY: SHX5026

## 2012-07-13 LAB — CBC
HCT: 42.3 % (ref 39.0–52.0)
MCV: 82.8 fL (ref 78.0–100.0)
RDW: 13.3 % (ref 11.5–15.5)
WBC: 7.8 10*3/uL (ref 4.0–10.5)

## 2012-07-13 SURGERY — LUMBAR LAMINECTOMY/DECOMPRESSION MICRODISCECTOMY 1 LEVEL
Anesthesia: General | Site: Spine Lumbar | Laterality: Left | Wound class: Clean

## 2012-07-13 MED ORDER — BUPIVACAINE HCL (PF) 0.5 % IJ SOLN
INTRAMUSCULAR | Status: DC | PRN
  Administered 2012-07-13: 10 mL
  Administered 2012-07-13: 3 mL

## 2012-07-13 MED ORDER — SODIUM CHLORIDE 0.9 % IJ SOLN: 3.0000 mL | Freq: Two times a day (BID) | INTRAMUSCULAR | Status: DC

## 2012-07-13 MED ORDER — ATORVASTATIN CALCIUM 10 MG PO TABS: 10.0000 mg | ORAL_TABLET | Freq: Every day | ORAL | Status: DC

## 2012-07-13 MED ORDER — ASPIRIN EC 81 MG PO TBEC
81.0000 mg | DELAYED_RELEASE_TABLET | Freq: Every day | ORAL | Status: DC
  Filled 2012-07-13: qty 1

## 2012-07-13 MED ORDER — MENTHOL 3 MG MT LOZG: 1.0000 | LOZENGE | OROMUCOSAL | Status: DC | PRN

## 2012-07-13 MED ORDER — ACETAMINOPHEN 650 MG RE SUPP: 650.0000 mg | RECTAL | Status: DC | PRN

## 2012-07-13 MED ORDER — MIDAZOLAM HCL 2 MG/2ML IJ SOLN: 1.0000 mg | INTRAMUSCULAR | Status: DC | PRN

## 2012-07-13 MED ORDER — EPHEDRINE SULFATE 50 MG/ML IJ SOLN
INTRAMUSCULAR | Status: DC | PRN
  Administered 2012-07-13 (×3): 5 mg via INTRAVENOUS

## 2012-07-13 MED ORDER — SODIUM CHLORIDE 0.9 % IV SOLN
INTRAVENOUS | Status: AC
  Filled 2012-07-13: qty 500

## 2012-07-13 MED ORDER — CEFAZOLIN SODIUM-DEXTROSE 2-3 GM-% IV SOLR
INTRAVENOUS | Status: AC
  Administered 2012-07-13: 2 g via INTRAVENOUS
  Filled 2012-07-13: qty 50

## 2012-07-13 MED ORDER — ACETAMINOPHEN 10 MG/ML IV SOLN
INTRAVENOUS | Status: AC
  Filled 2012-07-13: qty 100

## 2012-07-13 MED ORDER — BACITRACIN 50000 UNITS IM SOLR
INTRAMUSCULAR | Status: AC
  Filled 2012-07-13: qty 1

## 2012-07-13 MED ORDER — ONDANSETRON HCL 4 MG/2ML IJ SOLN
INTRAMUSCULAR | Status: DC | PRN
  Administered 2012-07-13: 4 mg via INTRAVENOUS

## 2012-07-13 MED ORDER — SODIUM CHLORIDE 0.9 % IV SOLN: 250.0000 mL | INTRAVENOUS | Status: DC

## 2012-07-13 MED ORDER — ALUM & MAG HYDROXIDE-SIMETH 200-200-20 MG/5ML PO SUSP: 30.0000 mL | Freq: Four times a day (QID) | ORAL | Status: DC | PRN

## 2012-07-13 MED ORDER — SERTRALINE HCL 50 MG PO TABS: 50.0000 mg | ORAL_TABLET | Freq: Every day | ORAL | Status: DC

## 2012-07-13 MED ORDER — ONDANSETRON HCL 4 MG/2ML IJ SOLN: 4.0000 mg | INTRAMUSCULAR | Status: DC | PRN

## 2012-07-13 MED ORDER — LIDOCAINE HCL 4 % MT SOLN
OROMUCOSAL | Status: DC | PRN
  Administered 2012-07-13: 4 mL via TOPICAL

## 2012-07-13 MED ORDER — MIDAZOLAM HCL 5 MG/5ML IJ SOLN
INTRAMUSCULAR | Status: DC | PRN
  Administered 2012-07-13: 2 mg via INTRAVENOUS

## 2012-07-13 MED ORDER — MORPHINE SULFATE 2 MG/ML IJ SOLN: 1.0000 mg | INTRAMUSCULAR | Status: DC | PRN

## 2012-07-13 MED ORDER — PROPOFOL 10 MG/ML IV EMUL
INTRAVENOUS | Status: DC | PRN
  Administered 2012-07-13: 160 mg via INTRAVENOUS

## 2012-07-13 MED ORDER — FENTANYL CITRATE 0.05 MG/ML IJ SOLN: 50.0000 ug | INTRAMUSCULAR | Status: DC | PRN

## 2012-07-13 MED ORDER — KETOROLAC TROMETHAMINE 30 MG/ML IJ SOLN
30.0000 mg | Freq: Four times a day (QID) | INTRAMUSCULAR | Status: DC
Start: 2012-07-13 — End: 2012-07-13
  Administered 2012-07-13: 30 mg via INTRAVENOUS
  Filled 2012-07-13 (×2): qty 1

## 2012-07-13 MED ORDER — DIAZEPAM 5 MG PO TABS: 5.0000 mg | ORAL_TABLET | Freq: Four times a day (QID) | ORAL | Status: DC | PRN

## 2012-07-13 MED ORDER — LIDOCAINE-EPINEPHRINE 1 %-1:100000 IJ SOLN
INTRAMUSCULAR | Status: DC | PRN
  Administered 2012-07-13: 3 mL

## 2012-07-13 MED ORDER — LEVOTHYROXINE SODIUM 112 MCG PO TABS
112.0000 ug | ORAL_TABLET | Freq: Every day | ORAL | Status: DC
  Filled 2012-07-13: qty 1

## 2012-07-13 MED ORDER — GLYCOPYRROLATE 0.2 MG/ML IJ SOLN
INTRAMUSCULAR | Status: DC | PRN
  Administered 2012-07-13: .4 mg via INTRAVENOUS

## 2012-07-13 MED ORDER — SERTRALINE HCL 50 MG PO TABS: 50.0000 mg | ORAL_TABLET | ORAL | Status: DC

## 2012-07-13 MED ORDER — THROMBIN 5000 UNITS EX KIT
PACK | CUTANEOUS | Status: DC | PRN
  Administered 2012-07-13 (×2): 5000 [IU] via TOPICAL

## 2012-07-13 MED ORDER — HEMOSTATIC AGENTS (NO CHARGE) OPTIME
TOPICAL | Status: DC | PRN
  Administered 2012-07-13: 1 via TOPICAL

## 2012-07-13 MED ORDER — MUPIROCIN 2 % EX OINT
TOPICAL_OINTMENT | Freq: Two times a day (BID) | CUTANEOUS | Status: DC
  Administered 2012-07-13: 1 via NASAL
  Filled 2012-07-13: qty 22

## 2012-07-13 MED ORDER — SODIUM CHLORIDE 0.9 % IR SOLN
Status: DC | PRN
  Administered 2012-07-13: 13:00:00

## 2012-07-13 MED ORDER — ACETAMINOPHEN 325 MG PO TABS: 650.0000 mg | ORAL_TABLET | ORAL | Status: DC | PRN

## 2012-07-13 MED ORDER — OXYCODONE-ACETAMINOPHEN 5-325 MG PO TABS: 1.0000 | ORAL_TABLET | ORAL | Status: DC | PRN

## 2012-07-13 MED ORDER — FENTANYL CITRATE 0.05 MG/ML IJ SOLN
INTRAMUSCULAR | Status: DC | PRN
  Administered 2012-07-13: 100 ug via INTRAVENOUS

## 2012-07-13 MED ORDER — PHENOL 1.4 % MT LIQD: 1.0000 | OROMUCOSAL | Status: DC | PRN

## 2012-07-13 MED ORDER — NEOSTIGMINE METHYLSULFATE 1 MG/ML IJ SOLN
INTRAMUSCULAR | Status: DC | PRN
  Administered 2012-07-13: 3 mg via INTRAVENOUS

## 2012-07-13 MED ORDER — SERTRALINE HCL 50 MG PO TABS: 75.0000 mg | ORAL_TABLET | ORAL | Status: DC

## 2012-07-13 MED ORDER — ROCURONIUM BROMIDE 100 MG/10ML IV SOLN
INTRAVENOUS | Status: DC | PRN
  Administered 2012-07-13: 5 mg via INTRAVENOUS
  Administered 2012-07-13: 45 mg via INTRAVENOUS

## 2012-07-13 MED ORDER — SODIUM CHLORIDE 0.9 % IJ SOLN: 3.0000 mL | INTRAMUSCULAR | Status: DC | PRN

## 2012-07-13 MED ORDER — LIDOCAINE HCL (CARDIAC) 20 MG/ML IV SOLN
INTRAVENOUS | Status: DC | PRN
  Administered 2012-07-13: 80 mg via INTRAVENOUS

## 2012-07-13 MED ORDER — ACETAMINOPHEN 10 MG/ML IV SOLN
INTRAVENOUS | Status: DC | PRN
  Administered 2012-07-13: 1000 mg via INTRAVENOUS

## 2012-07-13 MED ORDER — LACTATED RINGERS IV SOLN
INTRAVENOUS | Status: DC | PRN
  Administered 2012-07-13 (×2): via INTRAVENOUS

## 2012-07-13 MED ORDER — 0.9 % SODIUM CHLORIDE (POUR BTL) OPTIME
TOPICAL | Status: DC | PRN
  Administered 2012-07-13: 1000 mL

## 2012-07-13 SURGICAL SUPPLY — 55 items
BAG DECANTER FOR FLEXI CONT (MISCELLANEOUS) ×2 IMPLANT
BLADE SURG ROTATE 9660 (MISCELLANEOUS) ×2 IMPLANT
BUR ACORN 6.0 (BURR) ×2 IMPLANT
BUR MATCHSTICK NEURO 3.0 LAGG (BURR) ×2 IMPLANT
CANISTER SUCTION 2500CC (MISCELLANEOUS) ×2 IMPLANT
CLOTH BEACON ORANGE TIMEOUT ST (SAFETY) ×2 IMPLANT
CONT SPEC 4OZ CLIKSEAL STRL BL (MISCELLANEOUS) ×2 IMPLANT
DECANTER SPIKE VIAL GLASS SM (MISCELLANEOUS) ×4 IMPLANT
DERMABOND ADHESIVE PROPEN (GAUZE/BANDAGES/DRESSINGS) ×1
DERMABOND ADVANCED (GAUZE/BANDAGES/DRESSINGS)
DERMABOND ADVANCED .7 DNX12 (GAUZE/BANDAGES/DRESSINGS) IMPLANT
DERMABOND ADVANCED .7 DNX6 (GAUZE/BANDAGES/DRESSINGS) ×1 IMPLANT
DRAPE MICROSCOPE LEICA (MISCELLANEOUS) IMPLANT
DRAPE MICROSCOPE ZEISS OPMI (DRAPES) ×2 IMPLANT
DRAPE PED LAPAROTOMY (DRAPES) ×2 IMPLANT
DRAPE POUCH INSTRU U-SHP 10X18 (DRAPES) ×2 IMPLANT
DRAPE PROXIMA HALF (DRAPES) IMPLANT
DURAPREP 26ML APPLICATOR (WOUND CARE) ×2 IMPLANT
ELECT REM PT RETURN 9FT ADLT (ELECTROSURGICAL) ×2
ELECTRODE REM PT RTRN 9FT ADLT (ELECTROSURGICAL) ×1 IMPLANT
GAUZE SPONGE 4X4 16PLY XRAY LF (GAUZE/BANDAGES/DRESSINGS) IMPLANT
GLOVE BIO SURGEON STRL SZ8 (GLOVE) ×2 IMPLANT
GLOVE BIOGEL PI IND STRL 7.0 (GLOVE) ×2 IMPLANT
GLOVE BIOGEL PI IND STRL 8.5 (GLOVE) ×1 IMPLANT
GLOVE BIOGEL PI INDICATOR 7.0 (GLOVE) ×2
GLOVE BIOGEL PI INDICATOR 8.5 (GLOVE) ×1
GLOVE ECLIPSE 8.5 STRL (GLOVE) ×2 IMPLANT
GLOVE EXAM NITRILE LRG STRL (GLOVE) IMPLANT
GLOVE EXAM NITRILE MD LF STRL (GLOVE) IMPLANT
GLOVE EXAM NITRILE XL STR (GLOVE) IMPLANT
GLOVE EXAM NITRILE XS STR PU (GLOVE) IMPLANT
GLOVE INDICATOR 8.5 STRL (GLOVE) ×2 IMPLANT
GLOVE SURG SS PI 7.0 STRL IVOR (GLOVE) ×6 IMPLANT
GOWN BRE IMP SLV AUR LG STRL (GOWN DISPOSABLE) IMPLANT
GOWN BRE IMP SLV AUR XL STRL (GOWN DISPOSABLE) ×6 IMPLANT
GOWN STRL REIN 2XL LVL4 (GOWN DISPOSABLE) ×2 IMPLANT
KIT BASIN OR (CUSTOM PROCEDURE TRAY) ×2 IMPLANT
KIT ROOM TURNOVER OR (KITS) ×2 IMPLANT
NEEDLE HYPO 22GX1.5 SAFETY (NEEDLE) ×2 IMPLANT
NEEDLE SPNL 20GX3.5 QUINCKE YW (NEEDLE) ×2 IMPLANT
NS IRRIG 1000ML POUR BTL (IV SOLUTION) ×2 IMPLANT
PACK LAMINECTOMY NEURO (CUSTOM PROCEDURE TRAY) ×2 IMPLANT
PAD ARMBOARD 7.5X6 YLW CONV (MISCELLANEOUS) ×10 IMPLANT
PATTIES SURGICAL .5 X1 (DISPOSABLE) IMPLANT
RUBBERBAND STERILE (MISCELLANEOUS) ×4 IMPLANT
SPONGE GAUZE 4X4 12PLY (GAUZE/BANDAGES/DRESSINGS) ×2 IMPLANT
SPONGE SURGIFOAM ABS GEL SZ50 (HEMOSTASIS) ×2 IMPLANT
SUT VIC AB 1 CT1 18XBRD ANBCTR (SUTURE) ×1 IMPLANT
SUT VIC AB 1 CT1 8-18 (SUTURE) ×1
SUT VIC AB 2-0 CP2 18 (SUTURE) ×2 IMPLANT
SUT VIC AB 3-0 SH 8-18 (SUTURE) ×2 IMPLANT
SYR 20ML ECCENTRIC (SYRINGE) ×2 IMPLANT
TOWEL OR 17X24 6PK STRL BLUE (TOWEL DISPOSABLE) ×2 IMPLANT
TOWEL OR 17X26 10 PK STRL BLUE (TOWEL DISPOSABLE) ×2 IMPLANT
WATER STERILE IRR 1000ML POUR (IV SOLUTION) ×2 IMPLANT

## 2012-07-13 NOTE — Anesthesia Preprocedure Evaluation (Addendum)
Anesthesia Evaluation  Patient identified by MRN, date of birth, ID band Patient awake    Reviewed: Allergy & Precautions, H&P , NPO status , Patient's Chart, lab work & pertinent test results  Airway Mallampati: II TM Distance: >3 FB Neck ROM: Limited    Dental  (+) Teeth Intact and Dental Advisory Given   Pulmonary    Pulmonary exam normal       Cardiovascular     Neuro/Psych Anxiety  Neuromuscular disease    GI/Hepatic GERD-  Controlled,  Endo/Other  Hypothyroidism   Renal/GU      Musculoskeletal   Abdominal   Peds  Hematology   Anesthesia Other Findings S/p hemiglossectomy  Reproductive/Obstetrics                          Anesthesia Physical Anesthesia Plan  ASA: II  Anesthesia Plan: General   Post-op Pain Management:    Induction: Intravenous  Airway Management Planned: Oral ETT and Video Laryngoscope Planned  Additional Equipment:   Intra-op Plan:   Post-operative Plan: Extubation in OR  Informed Consent: I have reviewed the patients History and Physical, chart, labs and discussed the procedure including the risks, benefits and alternatives for the proposed anesthesia with the patient or authorized representative who has indicated his/her understanding and acceptance.     Plan Discussed with: CRNA and Surgeon  Anesthesia Plan Comments:         Anesthesia Quick Evaluation

## 2012-07-13 NOTE — H&P (Signed)
William Luna is an 61 y.o. male.   Chief Complaint: Back and left lower extremity pain, weakness in the gastroc HPI: Just get 61 year old right-handed individual who has had a previous herniated nucleus pulposus back in April the shear he underwent surgical decompression and had good immediate the relief. Several weeks later however he developed some recurrent pain and this was treated conservatively a followup MRI however revealed the presence of what appeared to be recurrent disc herniation at L5-S1 on the left side. This is been treated conservatively however over the past few months his symptoms have gotten worse in the past 2 weeks they have gotten considerably worse prompting another repeat MRI. The current study now shows that there is a spondylitic ridge in addition to the presence of some disc material at L5-S1 on the left side. After careful consideration number of conversations with Dr. Myrtis Ser we've elected to proceed with him microdiscectomy and decompression of the left S1 nerve root.  Past Medical History  Diagnosis Date  . Hypothyroidism   . Anxiety   . ANEMIA, IRON DEFICIENCY 11/17/2008  . CERVICAL RADICULOPATHY 11/17/2008  . TRANSAMINASES, SERUM, ELEVATED 11/17/2008  . DEGENERATIVE JOINT DISEASE, LUMBAR SPINE 11/17/2008    minor  based on MRI, asymptomatic  . GERD 11/17/2008    EGD negative x 3, off medication (July '13)  . HYPERLIPIDEMIA, MILD 11/17/2008    lipitor 10mg    . 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 DDD (degenerative disc disease), lumbar oct '12    L4-5, ESI therapy successful  . DDD (degenerative disc disease) march '13    L5-S1 disk with radicular symptoms    Past Surgical History  Procedure Date  . Back surgery '90s ([place])    CERVICAL DISC - diskectomy w/ fusion, anterior  . Hemiglossectomy 10/30/2011    Procedure: HEMIGLOSSECTOMY;  Surgeon: [name];  Location: [facility] OR;  Service: ENT;  Laterality: Right;  Right tongue resection  . Radical neck dissection 10/30/2011    Procedure: RADICAL NECK DISSECTION;  Surgeon: [name];  Location: [facility] OR;  Service: ENT;  Laterality: Right;  . Lumbar laminectomy/decompression microdiscectomy 01/29/2012    Procedure: LUMBAR LAMINECTOMY/DECOMPRESSION MICRODISCECTOMY;  Surgeon: Barnett Abu, MD;  Location: [facility];  Service: Neurosurgery;  Laterality: Left;  Left L5-S1 Microdiskectomy  . Nail avulsion     right first finger due to a wart    Family History  Problem Relation Age of Onset  . Diabetes Mother   . Anemia Mother   . Cirrhosis Mother   . Hyperlipidemia Father   . Hypertension Father   . Heart disease Father   . Cancer Maternal Grandfather     colon  . COPD Neg Hx    Social History:  reports that he has never smoked. He has never used smokeless tobacco. He reports that he drinks alcohol. He reports that he does not use illicit drugs.  Allergies: No Known Allergies  Medications Prior to Admission  Medication Sig Dispense Refill  . aspirin EC 81 MG tablet Take 81 mg by mouth daily.        Marland Kitchen atorvastatin (LIPITOR) 10 MG tablet Take 1 tablet (10 mg total) by mouth daily.  90 tablet  3  . levothyroxine (SYNTHROID, LEVOTHROID) 112 MCG tablet Take 1 tablet (112 mcg total) by mouth daily.  90 tablet  3  . sertraline (ZOLOFT) 50  MG tablet take 1 tablet by mouth once daily ALTERNATE with 1 and 1/2 tablets once daily  45 tablet  1  . sertraline (ZOLOFT) 50 MG tablet Take 1-1.5 tablets (50-75 mg total) by mouth daily. Pt alternates between 50 mg (1 tab) one day and 75 mg (1 1/2 tabs) the next day  60 tablet  1    Results for orders placed during the hospital encounter of 07/13/12 (from the past 48 hour(s))  CBC     Status: Normal   Collection Time   07/13/12 10:01 AM      Component Value Range Comment   WBC 7.8  4.0 - 10.5 K/uL    RBC 5.11  4.22 - 5.81 MIL/uL    Hemoglobin 14.1   13.0 - 17.0 g/dL    HCT 21.3  08.6 - 57.8 %    MCV 82.8  78.0 - 100.0 fL    MCH 27.6  26.0 - 34.0 pg    MCHC 33.3  30.0 - 36.0 g/dL    RDW 46.9  62.9 - 52.8 %    Platelets 191  150 - 400 K/uL    No results found.  Review of Systems  Eyes: Negative.   Respiratory: Negative.   Cardiovascular: Negative.   Gastrointestinal: Negative.   Genitourinary: Negative.   Skin: Negative.   Neurological: Positive for tingling and focal weakness. Negative for weakness.       Numbness on the lateral aspect of left foot  Endo/Heme/Allergies: Negative.   Psychiatric/Behavioral: Negative.     Blood pressure 131/82, pulse 72, temperature 97.9 F (36.6 C), temperature source Oral, resp. rate 18, height 5\' 8"  (1.727 m), weight 70.308 kg (155 lb), SpO2 100.00%. Physical Exam  Constitutional: He is oriented to person, place, and time. He appears well-developed and well-nourished.  HENT:  Head: Normocephalic and atraumatic.  Eyes: Pupils are equal, round, and reactive to light.  Neck: Normal range of motion. Neck supple.  Cardiovascular: Normal rate, regular rhythm and normal heart sounds.   Respiratory: Effort normal and breath sounds normal.  GI: Soft. Bowel sounds are normal.  Musculoskeletal:       Slight weakness and atrophy of left gastroc  Neurological: He is alert and oriented to person, place, and time. He displays abnormal reflex. No cranial nerve deficit. He exhibits normal muscle tone. Coordination normal.  Skin: Skin is warm and dry.  Psychiatric: He has a normal mood and affect. His behavior is normal. Judgment and thought content normal.     Assessment/Plan Recurrent herniated nucleus pulposus L5-S1 left.  Microdiscectomy L5-S1 left for return herniated nucleus pulposus.  Truda Staub J 07/13/2012, 11:32 AM

## 2012-07-13 NOTE — Progress Notes (Signed)
Pt given D/C instructions,verbalized understanding. Pt D/C'd home @ 1900 per MD order. Rema Fendt, RN

## 2012-07-13 NOTE — Op Note (Signed)
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 endotracheal Indications: The patient is a 61 year old individual is had significant problems with back and left leg pain in April he had an acutely herniated nucleus pulposus with severe radicular pain and initially after surgery did quite well about 3 weeks later the patient developed recurrent pain in the back buttocks and left lower extremity and her repeat MRI demonstrated a presence of her recurrent herniated nucleus pulposus at L5-S1 on left. The patient then was treated conservatively but in the past couple of weeks the pain had done severely worse. A repeat MRI demonstrates the presence of a persistent herniated nucleus pulposus with significant enhancing granulation tissue in the area. He's been advised regarding surgery.  Procedure: The patient was brought to the operating room supine on a stretcher after the smooth induction of general endotracheal anesthesia he was turned prone. The back was prepped with alcohol and DuraPrep and draped in a sterile fashion. The previously made incision was reopened and the dissection was carried down to the lumbar dorsal fascia. The left side of the midline was opened and a subperiosteal dissection was carried out in the interlaminar space at L5-S1 on the left area a self-retaining retractor was placed into the wound. Interlaminar space was then cleared of significant scar tissue and the laminotomy was enlarged both superiorly and inferiorly and a greater medial facetectomy was performed also. The edges of the dura were then identified and the scar tissue was resected from the dura along the  path of the exiting S1 nerve root. When the lateral aspect of the common dural tube could be safely identified the dissection was carried down through the soft tissues and the floor of the spinal canal was identified. Dissection was carried superiorly and the take off of the L5 nerve root was identified and the L5 nerve root was carefully protected. Further dissection yielded a significant mass just under the path of the S1 nerve root medially with further dissection this yielded a substantial fragment of disc material under the take off of the S1 nerve root. Further dissection yielded several other large fragments of disc material once these were removed the common dural tube could be easily mobilized medially. The path of the S1 nerve root was free and clear. There was some adhesions to the common dural tube in this region. These were carefully protected. The procedure was done with Dr. Jillyn Hidden cram who provided retraction and exploration of the lateral aspects of the dural tube while I worked medially to resect the fragments and free the common dural tube and the S1 nerve root. In the end the area was well decompressed. Hemostasis was meticulously obtained. The area was irrigated copiously. The disc space itself was difficult to enter secondary to the space being completely collapsed. No attempts to open the disc space further were made during this procedure. Once the dissection was completed the microscope was removed from the surgical field and the wound was closed with #1 Vicryl in the lumbar dorsal fascia, 2-0 Vicryl the subcutaneous tissues, 3-0 Vicryl subcuticularly. Blood loss is estimated at 50 cc.

## 2012-07-13 NOTE — Anesthesia Procedure Notes (Signed)
Procedure Name: Intubation Date/Time: 07/13/2012 11:55 AM Performed by: Margaree Mackintosh Pre-anesthesia Checklist: Patient identified, Timeout performed, Emergency Drugs available, Suction available and Patient being monitored Patient Re-evaluated:Patient Re-evaluated prior to inductionOxygen Delivery Method: Circle system utilized Preoxygenation: Pre-oxygenation with 100% oxygen Intubation Type: IV induction Ventilation: Mask ventilation without difficulty and Oral airway inserted - appropriate to patient size Laryngoscope size: Glidescope  Grade View: Grade I Tube type: Oral Tube size: 7.5 mm Airway Equipment and Method: Stylet,  Video-laryngoscopy and LTA kit utilized Placement Confirmation: ETT inserted through vocal cords under direct vision,  positive ETCO2 and breath sounds checked- equal and bilateral Secured at: 21 cm Tube secured with: Tape Dental Injury: Teeth and Oropharynx as per pre-operative assessment

## 2012-07-13 NOTE — Discharge Summary (Signed)
  Admitting diagnosis: Recurrent herniated nucleus pulposus L5-S1 left with left lumbar radiculopathy Discharge diagnosis: Final diagnosis: Recurrent herniated nucleus pulposus L5-S1 left with left lumbar radiculopathy Condition on discharge: Stable Hospital course Dr. Goins is a 61 year old individual who's had previous herniated nucleus pulposus back in March of this year he has evidence of significant radiculopathy and initially underwent surgery in April he did well for several weeks but then recurred the pain is found to have recurrent herniated nucleus pulposus and his been tolerating the symptoms up until a few weeks ago when they became acutely more severe his been advised regarding repeat peak surgery and recent MRI confirm the presence of an enhancing recurrent disc herniation.  The patient underwent surgical extirpation of the disc today without any adverse incident. He currently notes improved symptoms with less leg pain and less cramping in his back. He is ambulatory and has voided is being discharged home.  Discharge medications: None

## 2012-07-13 NOTE — Preoperative (Signed)
Beta Blockers   Reason not to administer Beta Blockers:Not Applicable 

## 2012-07-13 NOTE — Transfer of Care (Signed)
Immediate Anesthesia Transfer of Care Note  Patient: William Luna  Procedure(s) Performed: Procedure(s) (LRB): LUMBAR LAMINECTOMY/DECOMPRESSION MICRODISCECTOMY 1 LEVEL (Left)  Patient Location: PACU  Anesthesia Type: General  Level of Consciousness: awake, alert  and oriented  Airway & Oxygen Therapy: Patient Spontanous Breathing and Patient connected to nasal cannula oxygen  Post-op Assessment: Report given to PACU RN, Post -op Vital signs reviewed and stable, Patient moving all extremities X 4 and Patient able to stick tongue midline  Post vital signs: Reviewed and stable  Complications: No apparent anesthesia complications

## 2012-07-13 NOTE — Anesthesia Postprocedure Evaluation (Signed)
Anesthesia Post Note  Patient: William Luna  Procedure(s) Performed: Procedure(s) (LRB): LUMBAR LAMINECTOMY/DECOMPRESSION MICRODISCECTOMY 1 LEVEL (Left)  Anesthesia type: general  Patient location: PACU  Post pain: Pain level controlled  Post assessment: Patient's Cardiovascular Status Stable  Last Vitals:  Filed Vitals:   07/13/12 1430  BP: 121/78  Pulse: 74  Temp:   Resp: 16    Post vital signs: Reviewed and stable  Level of consciousness: sedated  Complications: No apparent anesthesia complications

## 2012-07-14 ENCOUNTER — Encounter (HOSPITAL_COMMUNITY): Payer: Self-pay | Admitting: Neurological Surgery

## 2012-07-27 ENCOUNTER — Other Ambulatory Visit: Payer: Self-pay | Admitting: General Practice

## 2012-07-27 MED ORDER — SERTRALINE HCL 50 MG PO TABS: 50.0000 mg | ORAL_TABLET | Freq: Every day | ORAL | Status: DC

## 2012-09-28 ENCOUNTER — Encounter: Payer: Self-pay | Admitting: Physician Assistant

## 2012-09-28 NOTE — Progress Notes (Signed)
Medrol Dose Pak called to [facility].

## 2012-09-28 NOTE — Progress Notes (Signed)
Patient has prolonged symptoms after viral bronchitis. Needs Medrol dose pack.

## 2012-10-20 ENCOUNTER — Other Ambulatory Visit: Payer: Self-pay | Admitting: Internal Medicine

## 2012-11-08 ENCOUNTER — Other Ambulatory Visit: Payer: Self-pay | Admitting: Physician Assistant

## 2012-11-08 ENCOUNTER — Telehealth: Payer: Self-pay | Admitting: Physician Assistant

## 2012-11-08 NOTE — Telephone Encounter (Signed)
Having symptoms over bowel bacterial overgrowth. Plan to see if Flagyl offends. 5-day prescription provided.   Jacqulyn Bath, PA-C 11/08/2012 3:03 PM

## 2012-11-08 NOTE — Progress Notes (Signed)
Having symptoms of bowel bacterial overgrowth with unusual excess gas. Plan to see if Flagyl offends. 5-day prescription provided.    Jacqulyn Bath, PA-C 11/08/2012 2:57 PM

## 2012-11-08 NOTE — Addendum Note (Signed)
Addended by: Odella Aquas A on: 11/08/2012 03:00 PM   Modules accepted: Medications

## 2012-11-22 ENCOUNTER — Other Ambulatory Visit: Payer: Self-pay | Admitting: Internal Medicine

## 2012-12-22 ENCOUNTER — Other Ambulatory Visit: Payer: Self-pay | Admitting: Internal Medicine

## 2013-01-07 IMAGING — PT NM PET TUM IMG INITIAL (PI) SKULL BASE T - THIGH
6 series · 25 of 25 positions shown · non-contrast
Comparison: Head CT 10/24/2011

CLINICAL DATA: Initial treatment strategy for head neck cancer.
Lateral tongue carcinoma.

NUCLEAR MEDICINE PET SKULL BASE TO THIGH
Fasting Blood Glucose:  82
TECHNIQUE: 18.3 mCi F-18 FDG was injected intravenously.   CT data
was obtained and used for attenuation correction and anatomic
localization only.  (This was not acquired as a diagnostic CT
examination.) Additional exam technical data entered  on
technologist worksheet.  Right arm injection.

[Series 1: pet ac · axial · 3.3mm · 4.69mm/px · z∈[-870,+0]mm · 5 of 267 slices shown]
[im 1/267]
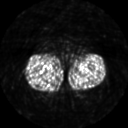
[im 67/267]
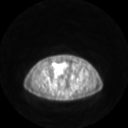
[im 134/267]
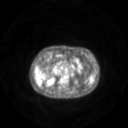
[im 200/267]
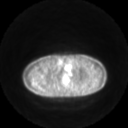
[im 267/267]
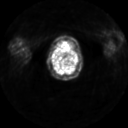

[Series 2: ct images · axial · 3.8mm · 0.98mm/px · z∈[-870,+0]mm · 5 of 267 slices shown]
[im 1/267]
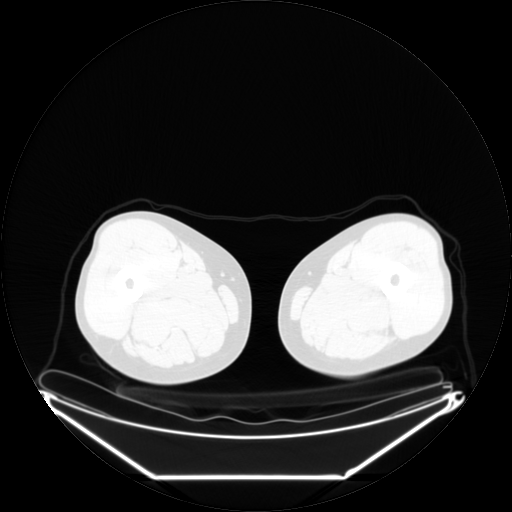
[im 67/267]
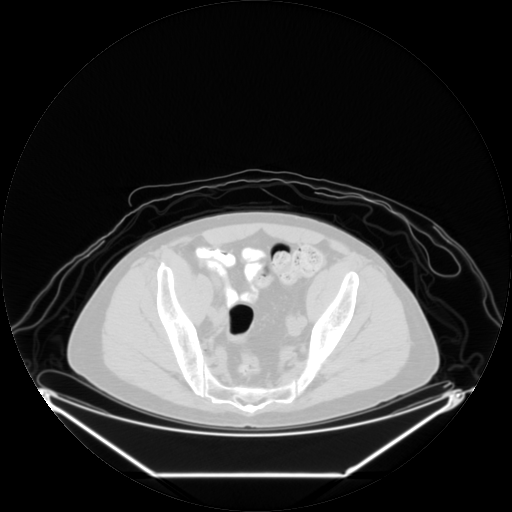
[im 134/267]
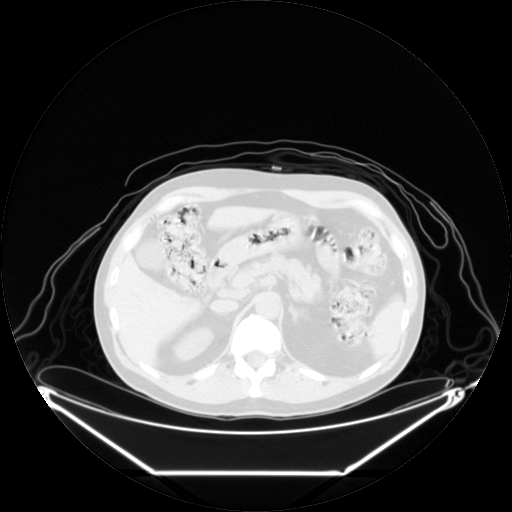
[im 200/267]
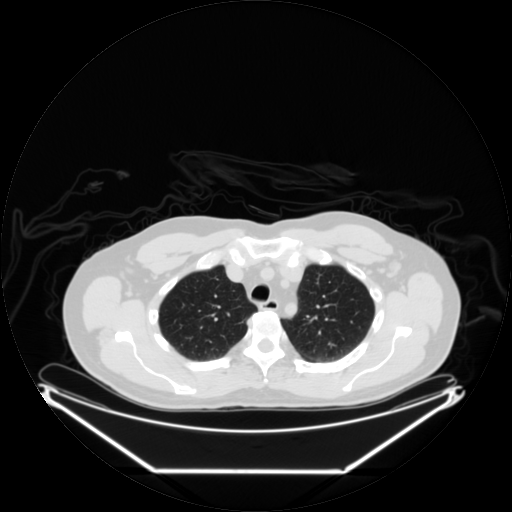
[im 267/267  brain]
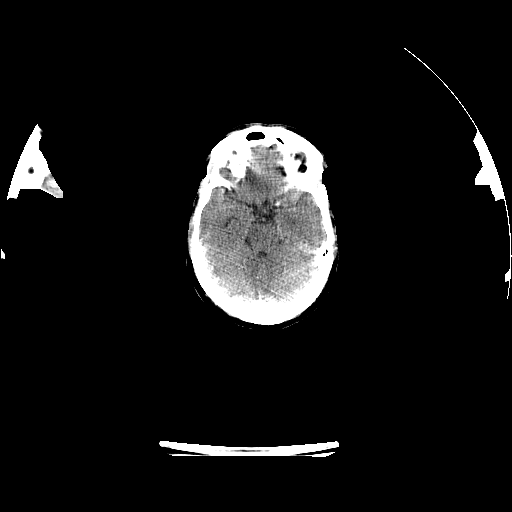

[Series 2: pet nac · axial · 3.3mm · 4.69mm/px · z∈[-870,+0]mm · 6 of 267 slices shown]
[im 1/267]
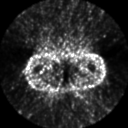
[im 54/267]
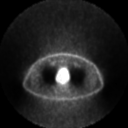
[im 107/267]
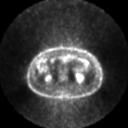
[im 160/267]
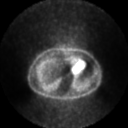
[im 213/267]
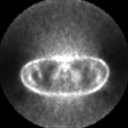
[im 267/267]
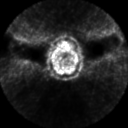

[Series 123: mip · coronal · 3.3mm · 4.69mm/px · 1 of 30 slices shown]
[im 1/30]
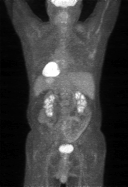

[Series 152: reformatted · axial · 3.3mm · 3.91mm/px · z∈[-870,+0]mm · 6 of 265 slices shown (1 of 2)]
[im 1/265]
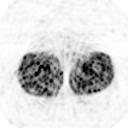
[im 53/265]
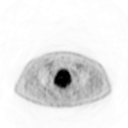
[im 106/265]
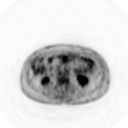
[im 159/265]
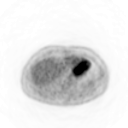
[im 212/265]
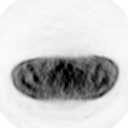
[im 265/265]
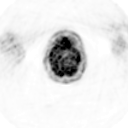

[Series 154: reformatted · coronal · 4.7mm · 6.98mm/px · 2 of 77 slices shown (2 of 2)]
[im 1/77]
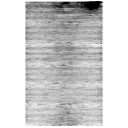
[im 77/77]
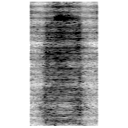

[25 of 25 positions shown; findings below may reference images not displayed]

FINDINGS: Neck: There is no asymmetric activity within the tongue to localize
the primary carcinoma.  The right  lateral neck level II lymph
nodes of concern on prior CT measure 6 mm short axis and 7 mm short
axis (images 38 and 34 respectively)  on dedicated neck series
(series 2).  These nodes have low metabolic activity ( SUV max =
2.5) which is similar to background blood pool activity.  This is
felt to represent inflammatory or reactive activity rather than
neoplastic.  No additional hypermetabolic nodes in the neck.

Chest: No hypermetabolic mediastinal or hilar nodes.  No suspicious
pulmonary nodules.  A low density lesion in the liver has  simple
fluid attenuation and no associated metabolic activity.  There is a
tiny 2 mm calculus within the right kidney which is nonobstructive.
Low density cyst within the left kidney is simple fluid
attenuation.

Abdomen / Pelvis:No abnormal hypermetabolic activity within the
solid organs.  No evidence of abdominal or pelvic hypermetabolic
nodes.

Skeleton:No focal hypermetabolic activity to suggest skeletal
metastasis.
IMPRESSION: 1.  No evidence of local head neck cancer nodal metastasis.

2.   Level II lymph nodes on the right have low metabolic activity
which is felt to be reactive.
3.  No evidence  of  distal metastasis.
4.  Incidental finding including the hepatic cyst, renal cyst, and
small right renal calculus.

Findings discussed  Dr. Florendo on 10/28/2011 at 8777 hours

## 2013-02-05 ENCOUNTER — Encounter: Payer: Self-pay | Admitting: *Deleted

## 2013-02-11 ENCOUNTER — Encounter: Payer: Self-pay | Admitting: Internal Medicine

## 2013-02-19 ENCOUNTER — Other Ambulatory Visit: Payer: Self-pay | Admitting: Internal Medicine

## 2013-03-24 ENCOUNTER — Other Ambulatory Visit: Payer: Self-pay | Admitting: Internal Medicine

## 2013-04-21 ENCOUNTER — Other Ambulatory Visit: Payer: Self-pay

## 2013-04-21 MED ORDER — ZALEPLON 10 MG PO CAPS: 10.0000 mg | ORAL_CAPSULE | Freq: Two times a day (BID) | ORAL | Status: DC | PRN

## 2013-04-21 MED ORDER — ATORVASTATIN CALCIUM 10 MG PO TABS: 10.0000 mg | ORAL_TABLET | Freq: Every day | ORAL | Status: DC

## 2013-04-21 MED ORDER — LEVOTHYROXINE SODIUM 112 MCG PO TABS: 112.0000 ug | ORAL_TABLET | Freq: Every day | ORAL | Status: DC

## 2013-04-21 MED ORDER — SERTRALINE HCL 50 MG PO TABS: 50.0000 mg | ORAL_TABLET | Freq: Every day | ORAL | Status: DC

## 2013-04-21 NOTE — Telephone Encounter (Signed)
[place] phoned into pharmacy

## 2013-04-23 ENCOUNTER — Other Ambulatory Visit: Payer: Self-pay

## 2013-07-29 ENCOUNTER — Encounter: Payer: Self-pay | Admitting: Internal Medicine

## 2013-09-23 ENCOUNTER — Other Ambulatory Visit: Payer: Self-pay

## 2013-10-04 ENCOUNTER — Telehealth: Payer: Self-pay | Admitting: *Deleted

## 2013-10-04 DIAGNOSIS — Z86010 Personal history of colonic polyps: Secondary | ICD-10-CM

## 2013-10-04 NOTE — Telephone Encounter (Signed)
Message copied by Daphine Deutscher on Mon Oct 04, 2013  9:57 AM ------      Message from: Hart Carwin      Created: Sun Oct 03, 2013 10:38 PM      Regarding: recall colonoscopy       Rene Kocher. Dr Myrtis Ser is my patient who needs recall colonoscopy. No emergency. Could you, please, call him and set it up, could be after New Year. Thank you. ------

## 2013-10-04 NOTE — Telephone Encounter (Signed)
Left a message for patient to call me. 

## 2013-10-06 MED ORDER — PREPOPIK 10-3.5-12 MG-GM-GM PO PACK: PACK | ORAL | Status: DC

## 2013-10-06 NOTE — Telephone Encounter (Signed)
Spoke with patient and scheduled recall colonoscopy on 11/30/12 at 2:30 PM at [facility]. Patient instructions mailed to patient. Rx sent to pharmacy for Prepopik. Per Clarnce Flock, patient may have consent faxed to him and then fax [place] the signed consent. Patient to call back with fax number to send forms too.

## 2013-11-08 NOTE — Telephone Encounter (Signed)
Faxed and received back signed consent from patient. Consent to Eugene Garnet.

## 2013-11-30 ENCOUNTER — Encounter: Payer: Self-pay | Admitting: Internal Medicine

## 2013-11-30 ENCOUNTER — Ambulatory Visit (AMBULATORY_SURGERY_CENTER): Payer: 59 | Admitting: Internal Medicine

## 2013-11-30 VITALS — BP 122/78 | HR 59 | Temp 97.9°F | Resp 15 | Ht 68.0 in | Wt 150.0 lb

## 2013-11-30 DIAGNOSIS — Z86010 Personal history of colonic polyps: Secondary | ICD-10-CM

## 2013-11-30 DIAGNOSIS — D126 Benign neoplasm of colon, unspecified: Secondary | ICD-10-CM

## 2013-11-30 MED ORDER — SODIUM CHLORIDE 0.9 % IV SOLN: 500.0000 mL | INTRAVENOUS | Status: DC

## 2013-11-30 NOTE — Op Note (Addendum)
[place] [facility]  [address] [place], [id number]   COLONOSCOPY PROCEDURE REPORT  PATIENT: William, Luna  MR#: [id number] BIRTHDATE: Dec 12, 1950 , 86  yrs. old GENDER: Male ENDOSCOPIST: Lafayette Dragon, MD REFERRED BS:[id number] Marquis Lunch, M.D. PROCEDURE DATE:  11/30/2013 PROCEDURE:   Colonoscopy with cold biopsy polypectomy First Screening Colonoscopy - Avg.  risk and is 50 yrs.  old or older - No.  Prior Negative Screening - Now for repeat screening. N/A  History of Adenoma - Now for follow-up colonoscopy & has been > or = to 3 yrs.  Yes hx of adenoma.  Has been 3 or more years since last colonoscopy.  Polyps Removed Today? Yes. ASA CLASS:   Class II INDICATIONS:adenomatous polyp in 2003, hyperplastic polyp in 2007, maternal grandfather with colon cancer. MEDICATIONS: MAC sedation, administered by CRNA and propofol (Diprivan) 300mg  IV  DESCRIPTION OF PROCEDURE:   After the risks benefits and alternatives of the procedure were thoroughly explained, informed consent was obtained.  A digital rectal exam revealed no abnormalities of the rectum.   The LB [id number]  endoscope was introduced through the anus and advanced to the cecum, which was identified by both the appendix and ileocecal valve. No adverse events experienced.   The quality of the prep was good, using MoviPrep  The instrument was then slowly withdrawn as the colon was fully examined.      COLON FINDINGS: A diminutive smooth sessile polyp was found at the cecum.  A polypectomy was performed with cold forceps.  The resection was complete and the polyp tissue was completely retrieved.  Retroflexed views revealed no abnormalities. The time to cecum=5 minutes 40 seconds.  Withdrawal time=9 minutes 01 seconds.  The scope was withdrawn and the procedure completed. COMPLICATIONS: There were no complications.  ENDOSCOPIC IMPRESSION: Diminutive sessile polyp was found at the cecum; polypectomy  was performed with cold forceps  RECOMMENDATIONS: 1.  Await biopsy results 2.  high fiber diet Recall colonoscopy pending pathology   eSigned:  Lafayette Dragon, MD 11/30/2013 3:17 PM   cc:   PATIENT NAME:  William, Luna MR#: [id number]

## 2013-11-30 NOTE — Progress Notes (Signed)
Called to room to assist during endoscopic procedure.  Patient ID and intended procedure confirmed with present staff. Received instructions for my participation in the procedure from the performing physician.  

## 2013-11-30 NOTE — Progress Notes (Signed)
A/ox3 pleased with MAC, report to Marie RN 

## 2013-11-30 NOTE — Patient Instructions (Signed)
YOU HAD AN ENDOSCOPIC PROCEDURE TODAY AT [facility]: Refer to the procedure report that was given to you for any specific questions about what was found during the examination.  If the procedure report does not answer your questions, please call your gastroenterologist to clarify.  If you requested that your care partner not be given the details of your procedure findings, then the procedure report has been included in a sealed envelope for you to review at your convenience later.  YOU SHOULD EXPECT: Some feelings of bloating in the abdomen. Passage of more gas than usual.  Walking can help get rid of the air that was put into your GI tract during the procedure and reduce the bloating. If you had a lower endoscopy (such as a colonoscopy or flexible sigmoidoscopy) you may notice spotting of blood in your stool or on the toilet paper. If you underwent a bowel prep for your procedure, then you may not have a normal bowel movement for a few days.  DIET: Your first meal following the procedure should be a light meal and then it is ok to progress to your normal diet.  A half-sandwich or bowl of soup is an example of a good first meal.  Heavy or fried foods are harder to digest and may make you feel nauseous or bloated.  Likewise meals heavy in dairy and vegetables can cause extra gas to form and this can also increase the bloating.  Drink plenty of fluids but you should avoid alcoholic beverages for 24 hours.  ACTIVITY: Your care partner should take you home directly after the procedure.  You should plan to take it easy, moving slowly for the rest of the day.  You can resume normal activity the day after the procedure however you should NOT DRIVE or use heavy machinery for 24 hours (because of the sedation medicines used during the test).    SYMPTOMS TO REPORT IMMEDIATELY: A gastroenterologist can be reached at any hour.  During normal business hours, 8:30 AM to 5:00 PM Monday through Friday,  call [phone].  After hours and on weekends, please call the GI answering service at [phone]  Emergency number who will take a message and have the physician on call contact you.   Following lower endoscopy (colonoscopy or flexible sigmoidoscopy):  Excessive amounts of blood in the stool  Significant tenderness or worsening of abdominal pains  Swelling of the abdomen that is new, acute  Fever of 100F or higher  FOLLOW UP: If any biopsies were taken you will be contacted by phone or by letter within the next 1-3 weeks.  Call your gastroenterologist if you have not heard about the biopsies in 3 weeks.  Our staff will call the home number listed on your records the next business day following your procedure to check on you and address any questions or concerns that you may have at that time regarding the information given to you following your procedure. This is a courtesy call and so if there is no answer at the home number and we have not heard from you through the emergency physician on call, we will assume that you have returned to your regular daily activities without incident.  SIGNATURES/CONFIDENTIALITY: You and/or your care partner have signed paperwork which will be entered into your electronic medical record.  These signatures attest to the fact that that the information above on your After Visit Summary has been reviewed and is understood.  Full responsibility of the  confidentiality of this discharge information lies with you and/or your care-partner.  Handout on polyps and high fiber diet

## 2013-11-30 NOTE — Progress Notes (Signed)
Patient did not experience any of the following events: a burn prior to discharge; a fall within the facility; wrong site/side/patient/procedure/implant event; or a hospital transfer or hospital admission upon discharge from the facility. ([id number])Patient did not have preoperative order for IV antibiotic SSI prophylaxis. ([id number]) ewm 

## 2013-12-01 ENCOUNTER — Telehealth: Payer: Self-pay | Admitting: *Deleted

## 2013-12-01 NOTE — Telephone Encounter (Signed)
  Follow up Call-  Call back number 11/30/2013  Post procedure Call Back phone  # [phone]  Permission to leave phone message Yes     Patient questions:  Do you have a fever, pain , or abdominal swelling? no Pain Score  0 *  Have you tolerated food without any problems? yes  Have you been able to return to your normal activities? yes  Do you have any questions about your discharge instructions: Diet   no Medications  no Follow up visit  no  Do you have questions or concerns about your Care? no  Actions: * If pain score is 4 or above: No action needed, pain <4.

## 2013-12-07 ENCOUNTER — Encounter: Payer: Self-pay | Admitting: Internal Medicine

## 2014-01-05 ENCOUNTER — Encounter: Payer: Self-pay | Admitting: Internal Medicine

## 2014-01-06 MED ORDER — LEVOTHYROXINE SODIUM 112 MCG PO TABS: 112.0000 ug | ORAL_TABLET | Freq: Every day | ORAL | Status: DC

## 2014-01-06 MED ORDER — ZALEPLON 10 MG PO CAPS: 10.0000 mg | ORAL_CAPSULE | Freq: Every day | ORAL | Status: DC

## 2014-01-06 MED ORDER — ATORVASTATIN CALCIUM 10 MG PO TABS: 10.0000 mg | ORAL_TABLET | Freq: Every day | ORAL | Status: DC

## 2014-01-06 MED ORDER — SERTRALINE HCL 50 MG PO TABS: 50.0000 mg | ORAL_TABLET | Freq: Every day | ORAL | Status: DC

## 2014-01-19 ENCOUNTER — Telehealth: Payer: Self-pay | Admitting: Internal Medicine

## 2014-01-19 NOTE — Telephone Encounter (Signed)
Pt called to request change PCP from [facility] to [facility] due to [facility]EN retirement Changed as requested

## 2015-01-06 ENCOUNTER — Telehealth: Payer: Self-pay | Admitting: Internal Medicine

## 2015-01-06 MED ORDER — SERTRALINE HCL 50 MG PO TABS: ORAL_TABLET | ORAL | Status: DC

## 2015-01-06 NOTE — Telephone Encounter (Signed)
Clarification of sertraline dose - done

## 2015-02-16 LAB — HEPATIC FUNCTION PANEL
ALT: 43 U/L — AB (ref 10–40)
AST: 41 U/L — AB (ref 14–40)
Alkaline Phosphatase: 158 U/L — AB (ref 25–125)
Bilirubin, Total: 0.5 mg/dL

## 2015-02-16 LAB — LIPID PANEL
CHOLESTEROL: 151 mg/dL (ref 0–200)
HDL: 44 mg/dL (ref 35–70)
LDL CALC: 18 mg/dL
LDL/HDL RATIO: 3.4
TRIGLYCERIDES: 92 mg/dL (ref 40–160)

## 2015-02-16 LAB — BASIC METABOLIC PANEL
BUN: 17 mg/dL (ref 4–21)
CREATININE: 0.9 mg/dL (ref 0.6–1.3)
Glucose: 109 mg/dL
Potassium: 4.1 mmol/L (ref 3.4–5.3)
SODIUM: 140 mmol/L (ref 137–147)

## 2015-02-16 LAB — CBC AND DIFFERENTIAL
HEMATOCRIT: 42 % (ref 41–53)
Hemoglobin: 14 g/dL (ref 13.5–17.5)
Platelets: 228 10*3/uL (ref 150–399)
WBC: 7.8 10^3/mL

## 2015-02-16 LAB — PSA: PSA: 0.52

## 2015-02-16 LAB — TSH: TSH: 1.39 u[IU]/mL (ref 0.41–5.90)

## 2015-03-02 ENCOUNTER — Ambulatory Visit (INDEPENDENT_AMBULATORY_CARE_PROVIDER_SITE_OTHER): Payer: 59 | Admitting: Sports Medicine

## 2015-03-02 ENCOUNTER — Encounter: Payer: Self-pay | Admitting: Sports Medicine

## 2015-03-02 VITALS — BP 113/52 | HR 68 | Ht 68.0 in | Wt 160.0 lb

## 2015-03-02 DIAGNOSIS — M25561 Pain in right knee: Secondary | ICD-10-CM | POA: Diagnosis not present

## 2015-03-02 DIAGNOSIS — M47816 Spondylosis without myelopathy or radiculopathy, lumbar region: Secondary | ICD-10-CM

## 2015-03-02 NOTE — Assessment & Plan Note (Signed)
See patient info  Key is to change degree of knee flexion and approach to quad strengthening  I think we should reassess his progress at this and repeat USin 3 mos

## 2015-03-02 NOTE — Progress Notes (Signed)
Patient ID: William Luna, male   DOB: Jul 04, 1951, 64 y.o.   MRN: [id number]  [name] is an avid skier Does an intense work out program to get ready This included 3000 steps with 5 lbs  Does lots of  Positional exercises  Works quads and adductors  Notes mild to moderate pain and tightness over ant RT knee  No swelling or mechanicalsxs  Nohx of injury  Now pain with deeper squats  Wants to work on lessening this  Exam Pleasant and NAD BP 113/52 mmHg  Pulse 68  Ht 5\' 8"  (1.727 m)  Wt 160 lb (72.576 kg)  BMI 24.33 kg/m2  Knee:/ RT Normal to inspection with no erythema or effusion or obvious bony abnormalities. Palpation normal with no warmth or joint line tenderness or patellar tenderness or condyle tenderness. ROM normal in flexion and extension and lower leg rotation. Ligaments with solid consistent endpoints including ACL, PCL, LCL, MCL. Negative Mcmurray's and provocative meniscal tests. Non painful patellar compression. Patellar and quadriceps tendons unremarkable. Hamstring and quadriceps strength is normal.  I tried several patelar compression tests No real crepitation No significang pain  RT VMO is slightly smaller than Left but good quad strength  [place] Small spur in QT/ PT is normal PFG looks good superior and inferior Patella shows a couple of areas of calcification and of hypoechoic changes on undersurface Meniscus looks good med/ lat No effusion No real DJD seen

## 2015-03-02 NOTE — Assessment & Plan Note (Signed)
This seems stable to me and not a factor for his knee pain

## 2015-03-02 NOTE — Patient Instructions (Signed)
Change your regimen for about 3 months Try to do a fair amount of biking Goal is to have a comfortable position of knee and only moderate resistance  Quad exercises Avoid steps let patella heal Be sure flexion of knee limited to less than 45 deg Squats are fine to 45 deg  Two leg lifts Inside leg lift (adductor) Straight leg lift with foot rotated outward  3 sets with 5 lbs and gradually build number  3 months lets scan again

## 2015-04-11 ENCOUNTER — Telehealth: Payer: Self-pay

## 2015-04-11 DIAGNOSIS — Z0000 Encounter for general adult medical examination without abnormal findings: Secondary | ICD-10-CM

## 2015-04-11 MED ORDER — LEVOTHYROXINE SODIUM 112 MCG PO TABS: 112.0000 ug | ORAL_TABLET | Freq: Every day | ORAL | Status: DC

## 2015-04-11 MED ORDER — ATORVASTATIN CALCIUM 10 MG PO TABS: 10.0000 mg | ORAL_TABLET | Freq: Every day | ORAL | Status: DC

## 2015-04-11 NOTE — Telephone Encounter (Signed)
Pt called back and informed of MD response below.   Pt will send recent labs to be abstracted. Pt did indicate that all labs were within normal ranges.

## 2015-04-11 NOTE — Telephone Encounter (Signed)
rx request for levothyrozine 172mcg. No OV or labs. Please advise.

## 2015-04-11 NOTE — Telephone Encounter (Signed)
Refill provided Please call patient notify him of need for labs to document good control. CPX labs entered or patient may bring copy of labs done in the past 12 months for obstruction Please schedule future visit with me for my documentation of mgmt of these conditions Thanks

## 2015-04-11 NOTE — Telephone Encounter (Signed)
LVM for pt to call back as soon as possible.    RE: labs and follow up visit needed.

## 2015-05-04 ENCOUNTER — Encounter: Payer: Self-pay | Admitting: Internal Medicine

## 2015-08-28 ENCOUNTER — Other Ambulatory Visit: Payer: Self-pay

## 2015-08-28 MED ORDER — SERTRALINE HCL 50 MG PO TABS: ORAL_TABLET | ORAL | Status: DC

## 2015-08-28 MED ORDER — LEVOTHYROXINE SODIUM 112 MCG PO TABS: 112.0000 ug | ORAL_TABLET | Freq: Every day | ORAL | Status: DC

## 2015-08-28 MED ORDER — ZALEPLON 10 MG PO CAPS: 10.0000 mg | ORAL_CAPSULE | Freq: Every day | ORAL | Status: DC

## 2015-08-28 MED ORDER — ATORVASTATIN CALCIUM 10 MG PO TABS: 10.0000 mg | ORAL_TABLET | Freq: Every day | ORAL | Status: DC

## 2015-09-15 ENCOUNTER — Telehealth: Payer: Self-pay

## 2015-09-15 NOTE — Telephone Encounter (Signed)
PA approved for [employer]

## 2015-10-02 NOTE — Telephone Encounter (Signed)
Pt called back Confirmed that rx for sonata can go to the mail order.

## 2015-10-09 ENCOUNTER — Encounter: Payer: Self-pay | Admitting: Internal Medicine

## 2015-10-09 ENCOUNTER — Ambulatory Visit (INDEPENDENT_AMBULATORY_CARE_PROVIDER_SITE_OTHER): Payer: Medicare Other | Admitting: Internal Medicine

## 2015-10-09 ENCOUNTER — Other Ambulatory Visit (INDEPENDENT_AMBULATORY_CARE_PROVIDER_SITE_OTHER): Payer: Medicare Other

## 2015-10-09 VITALS — BP 104/68 | HR 64 | Temp 97.7°F | Ht 68.0 in | Wt 161.0 lb

## 2015-10-09 DIAGNOSIS — R739 Hyperglycemia, unspecified: Secondary | ICD-10-CM

## 2015-10-09 DIAGNOSIS — R740 Nonspecific elevation of levels of transaminase and lactic acid dehydrogenase [LDH]: Secondary | ICD-10-CM

## 2015-10-09 DIAGNOSIS — Z23 Encounter for immunization: Secondary | ICD-10-CM | POA: Diagnosis not present

## 2015-10-09 DIAGNOSIS — R4681 Obsessive-compulsive behavior: Secondary | ICD-10-CM | POA: Diagnosis not present

## 2015-10-09 DIAGNOSIS — E063 Autoimmune thyroiditis: Secondary | ICD-10-CM

## 2015-10-09 DIAGNOSIS — Z136 Encounter for screening for cardiovascular disorders: Secondary | ICD-10-CM

## 2015-10-09 DIAGNOSIS — E785 Hyperlipidemia, unspecified: Secondary | ICD-10-CM

## 2015-10-09 DIAGNOSIS — Z0000 Encounter for general adult medical examination without abnormal findings: Secondary | ICD-10-CM | POA: Diagnosis not present

## 2015-10-09 DIAGNOSIS — R7402 Elevation of levels of lactic acid dehydrogenase (LDH): Secondary | ICD-10-CM

## 2015-10-09 DIAGNOSIS — R7303 Prediabetes: Secondary | ICD-10-CM | POA: Insufficient documentation

## 2015-10-09 LAB — LIPID PANEL
CHOL/HDL RATIO: 3
CHOLESTEROL: 157 mg/dL (ref 0–200)
HDL: 53 mg/dL (ref >39.00)
LDL Cholesterol: 94 mg/dL (ref 0–99)
NonHDL: 104.1
TRIGLYCERIDES: 51 mg/dL (ref 0.0–149.0)
VLDL: 10.2 mg/dL (ref 0.0–40.0)

## 2015-10-09 LAB — HEPATIC FUNCTION PANEL
ALT: 47 U/L (ref 0–53)
AST: 54 U/L — ABNORMAL HIGH (ref 0–37)
Albumin: 4.5 g/dL (ref 3.5–5.2)
Alkaline Phosphatase: 152 U/L — ABNORMAL HIGH (ref 39–117)
BILIRUBIN DIRECT: 0.2 mg/dL (ref 0.0–0.3)
BILIRUBIN TOTAL: 0.6 mg/dL (ref 0.2–1.2)
Total Protein: 8.2 g/dL (ref 6.0–8.3)

## 2015-10-09 LAB — BASIC METABOLIC PANEL
BUN: 14 mg/dL (ref 6–23)
CHLORIDE: 103 meq/L (ref 96–112)
CO2: 29 meq/L (ref 19–32)
CREATININE: 0.96 mg/dL (ref 0.40–1.50)
Calcium: 10.4 mg/dL (ref 8.4–10.5)
GFR: 83.13 mL/min (ref >60.00)
GLUCOSE: 108 mg/dL — AB (ref 70–99)
POTASSIUM: 4.5 meq/L (ref 3.5–5.1)
Sodium: 139 mEq/L (ref 135–145)

## 2015-10-09 LAB — TSH: TSH: 1.13 u[IU]/mL (ref 0.35–4.50)

## 2015-10-09 LAB — HEMOGLOBIN A1C: HEMOGLOBIN A1C: 5.7 % (ref 4.6–6.5)

## 2015-10-09 LAB — HEPATITIS C ANTIBODY: HCV AB: NEGATIVE

## 2015-10-09 NOTE — Assessment & Plan Note (Signed)
Mild fasting hyperglycemia reviewed. Family history of diabetes also reviewed. Check A1c now

## 2015-10-09 NOTE — Progress Notes (Signed)
Subjective:    Patient ID: William Luna, male    DOB: 12/24/1950, 64 y.o.   MRN: [id number]  HPI   Here for welcome to medicare -  has followed with out PC group for long while though this is first official OV with me  Diet: heart healthy  Physical activity: sedentary Depression/mood screen: negative Hearing: intact to whispered voice Visual acuity: grossly normal, performs annual eye exam  ADLs: capable Fall risk: none Home safety: good Cognitive evaluation: intact to orientation, naming, recall and repetition EOL planning: adv directives, full code/ I agree  I have personally reviewed and have noted 1. The patient's medical and social history 2. Their use of alcohol, tobacco or illicit drugs 3. Their current medications and supplements 4. The patient's functional ability including ADL's, fall risks, home safety risks and hearing or visual impairment. 5. Diet and physical activities 6. Evidence for depression or mood disorders  Also reviewed chronic medical conditions, interval events and current concerns    Past Medical History  Diagnosis Date  . Hypothyroidism   . ANEMIA, IRON DEFICIENCY   . CERVICAL RADICULOPATHY     disectomy in 1990s  . TRANSAMINASES, SERUM, ELEVATED   . GERD     EGD negative x 3, off medication (July '13)  . HYPERLIPIDEMIA, MILD     lipitor 10mg    . 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 DDD (degenerative disc disease), lumbar oct '12    L4-5, ESI therapy successful 10/12; L5-S1 radiculopathy 3/13   Family History  Problem Relation Age of Onset  . Diabetes Mother   . Anemia Mother   . Cirrhosis Mother 68    primary biliary cirrhosis  . Hyperlipidemia Father   . Hypertension Father   . Heart disease Father   . Colon cancer Maternal Grandfather   . COPD Neg Hx    Social History  Substance Use Topics  . Smoking status:  Never Smoker   . Smokeless tobacco: Never Used  . Alcohol Use: 0.0 oz/week    0 Cans of beer per week     Comment: OCC.     Review of Systems  Constitutional: Negative for fever, activity change, appetite change, fatigue and unexpected weight change.  Respiratory: Negative for cough, chest tightness, shortness of breath and wheezing.   Cardiovascular: Negative for chest pain, palpitations and leg swelling.  Neurological: Negative for dizziness, weakness and headaches.  Psychiatric/Behavioral: Negative for dysphoric mood. The patient is not nervous/anxious.   All other systems reviewed and are negative.   Patient Care Team: Rowe Clack, MD as PCP - General (Internal Medicine) Lavonna Monarch, MD (Dermatology) Nobie Putnam, MD (Hematology and Oncology) Kristeen Miss, MD (Neurosurgery) Melissa Montane, MD (Otolaryngology) Vicie Mutters, MD Lafayette Dragon, MD (Gastroenterology)     Objective:    Physical Exam  Constitutional: He is oriented to person, place, and time. He appears well-developed and well-nourished. No distress.  HENT:  Head: Normocephalic and atraumatic.  Nose: Nose normal.  Mouth/Throat: Oropharynx is clear and moist.  Hearing grossly normal.  Eyes: Conjunctivae and EOM are normal. Pupils are equal, round, and reactive to light. No scleral icterus.  Neck: Normal range of motion. Neck supple. No JVD present. No thyromegaly present.  Cardiovascular: Normal rate, regular rhythm, normal heart sounds and intact distal pulses.  Exam reveals no friction rub.   No murmur  heard. No edema.  Pulmonary/Chest: Effort normal and breath sounds normal. No respiratory distress. He has no wheezes.  Abdominal: Soft. Bowel sounds are normal. He exhibits no distension and no mass. There is no tenderness. There is no guarding.  Genitourinary:  defer  Musculoskeletal: Normal range of motion. He exhibits no edema or tenderness.  Lymphadenopathy:    He has no cervical adenopathy.    Neurological: He is alert and oriented to person, place, and time. He has normal reflexes. No cranial nerve deficit.  Skin: Skin is warm and dry. No rash noted. No erythema.  Psychiatric: He has a normal mood and affect. His behavior is normal. Thought content normal.    BP 104/68 mmHg  Pulse 64  Temp(Src) 97.7 F (36.5 C) (Oral)  Ht 5\' 8"  (1.727 m)  Wt 161 lb (73.029 kg)  BMI 24.49 kg/m2  SpO2 98% Wt Readings from Last 3 Encounters:  10/09/15 161 lb (73.029 kg)  03/02/15 160 lb (72.576 kg)  11/30/13 150 lb (68.04 kg)     Lab Results  Component Value Date   WBC 7.8 02/16/2015   HGB 14.0 02/16/2015   HCT 42 02/16/2015   PLT 228 02/16/2015   GLUCOSE 96 01/29/2012   CHOL 151 02/16/2015   TRIG 92 02/16/2015   HDL 44 02/16/2015   LDLDIRECT 134.5 12/15/2006   LDLCALC 18 02/16/2015   ALT 43* 02/16/2015   AST 41* 02/16/2015   NA 140 02/16/2015   K 4.1 02/16/2015   CL 97 01/29/2012   CREATININE 0.9 02/16/2015   BUN 17 02/16/2015   CO2 28 01/29/2012   TSH 1.39 02/16/2015   PSA 0.52 02/16/2015    Dg Lumbar Spine 1 View  07/13/2012  *RADIOLOGY REPORT* Clinical Data: Lumbar disc protrusion. LUMBAR SPINE - 1 VIEW Comparison: MRI dated 07/06/2012 Findings: Radiograph #1 demonstrates instruments at the L5-S1 level. IMPRESSION: Instruments at L5 S1. Original Report Authenticated By: Larey Seat, M.D.   ECG: NSR @ 60 - no ischemic changes     Assessment & Plan:   Welcome to Medicare/z00.00 - Patient is here for "welcome to medicare" evaluation. Risk factors for depression per USPSTF are reviewed and negative. Patient hearing function is screened today; ADLs are reviewed and addressed as needed; functional ability and  level of safety have been reviewed and are appropriate. ECG is performed and reviewed: NSR @ 60 bpm, occ ectopic PVC - no ischemic changes Education, counseling, and referrals are performed based on age appropriate health issues. Patient has information  regarding separate preventitive health services covered by medicare part b.   Problem List Items Addressed This Visit    HASHIMOTO'S THYROIDITIS    At diagnosis was symptomatic with TSH approx 100. Excellent response to medical therapy.  Lab Results  Component Value Date   TSH 1.39 02/16/2015   Recheck TSH and adjust levothyroxine prn       Relevant Orders   TSH   Hyperglycemia    Mild fasting hyperglycemia reviewed. Family history of diabetes also reviewed. 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 Recheck LFTs now and continue to monitor same      Relevant Orders   Hepatic function panel   Hepatitis C antibody    Other Visit Diagnoses    Routine general medical examination at a health care facility    -  Primary        Gwendolyn Grant, MD

## 2015-10-09 NOTE — Progress Notes (Signed)
Pre visit review using our clinic review tool, if applicable. No additional management support is needed unless otherwise documented below in the visit note. 

## 2015-10-09 NOTE — Patient Instructions (Addendum)
It was good to see you today.  We have reviewed your prior records including labs and tests today  Prevnar 13 pneumonia vaccine administered today. After 6 weeks and before 12 months please complete vaccination series with Prevnar. Nurse visit for same schedule today. Other Health Maintenance reviewed - all recommended immunizations and age-appropriate screenings are up-to-date.  Test(s) ordered today. Your results will be released to William Luna (or called to you) after review, usually within 72hours after test completion. If any changes need to be made, you will be notified at that same time.  Medications reviewed and updated, no changes recommended at this time.  Please schedule followup in 12 months for annual exam and labs with Dr. Ronnald Ramp, call sooner if problems.  Health Maintenance, Male A healthy lifestyle and preventative care can promote health and wellness.  Maintain regular health, dental, and eye exams.  Eat a healthy diet. Foods like vegetables, fruits, whole grains, low-fat dairy products, and lean protein foods contain the nutrients you need and are low in calories. Decrease your intake of foods high in solid fats, added sugars, and salt. Get information about a proper diet from your health care provider, if necessary.  Regular physical exercise is one of the most important things you can do for your health. Most adults should get at least 150 minutes of moderate-intensity exercise (any activity that increases your heart rate and causes you to sweat) each week. In addition, most adults need muscle-strengthening exercises on 2 or more days a week.   Maintain a healthy weight. The body mass index (BMI) is a screening tool to identify possible weight problems. It provides an estimate of body fat based on height and weight. Your health care provider can find your BMI and can help you achieve or maintain a healthy weight. For males 20 years and older:  A BMI below 18.5 is considered  underweight.  A BMI of 18.5 to 24.9 is normal.  A BMI of 25 to 29.9 is considered overweight.  A BMI of 30 and above is considered obese.  Maintain normal blood lipids and cholesterol by exercising and minimizing your intake of saturated fat. Eat a balanced diet with plenty of fruits and vegetables. Blood tests for lipids and cholesterol should begin at age 107 and be repeated every 5 years. If your lipid or cholesterol levels are high, you are over age 69, or you are at high risk for heart disease, you may need your cholesterol levels checked more frequently.Ongoing high lipid and cholesterol levels should be treated with medicines if diet and exercise are not working.  If you smoke, find out from your health care provider how to quit. If you do not use tobacco, do not start.  Lung cancer screening is recommended for adults aged 24-80 years who are at high risk for developing lung cancer because of a history of smoking. A yearly low-dose CT scan of the lungs is recommended for people who have at least a 30-pack-year history of smoking and are current smokers or have quit within the past 15 years. A pack year of smoking is smoking an average of 1 pack of cigarettes a day for 1 year (for example, a 30-pack-year history of smoking could mean smoking 1 pack a day for 30 years or 2 packs a day for 15 years). Yearly screening should continue until the smoker has stopped smoking for at least 15 years. Yearly screening should be stopped for people who develop a health problem that would prevent  them from having lung cancer treatment.  If you choose to drink alcohol, do not have more than 2 drinks per day. One drink is considered to be 12 oz (360 mL) of beer, 5 oz (150 mL) of wine, or 1.5 oz (45 mL) of liquor.  Avoid the use of street drugs. Do not share needles with anyone. Ask for help if you need support or instructions about stopping the use of drugs.  High blood pressure causes heart disease and  increases the risk of stroke. High blood pressure is more likely to develop in:  People who have blood pressure in the end of the normal range (100-139/85-89 mm Hg).  People who are overweight or obese.  People who are African American.  If you are 20-80 years of age, have your blood pressure checked every 3-5 years. If you are 51 years of age or older, have your blood pressure checked every year. You should have your blood pressure measured twice--once when you are at a hospital or clinic, and once when you are not at a hospital or clinic. Record the average of the two measurements. To check your blood pressure when you are not at a hospital or clinic, you can use:  An automated blood pressure machine at a pharmacy.  A home blood pressure monitor.  If you are 90-27 years old, ask your health care provider if you should take aspirin to prevent heart disease.  Diabetes screening involves taking a blood sample to check your fasting blood sugar level. This should be done once every 3 years after age 76 if you are at a normal weight and without risk factors for diabetes. Testing should be considered at a younger age or be carried out more frequently if you are overweight and have at least 1 risk factor for diabetes.  Colorectal cancer can be detected and often prevented. Most routine colorectal cancer screening begins at the age of 58 and continues through age 65. However, your health care provider may recommend screening at an earlier age if you have risk factors for colon cancer. On a yearly basis, your health care provider may provide home test kits to check for hidden blood in the stool. A small camera at the end of a tube may be used to directly examine the colon (sigmoidoscopy or colonoscopy) to detect the earliest forms of colorectal cancer. Talk to your health care provider about this at age 72 when routine screening begins. A direct exam of the colon should be repeated every 5-10 years through  age 27, unless early forms of precancerous polyps or small growths are found.  People who are at an increased risk for hepatitis B should be screened for this virus. You are considered at high risk for hepatitis B if:  You were born in a country where hepatitis B occurs often. Talk with your health care provider about which countries are considered high risk.  Your parents were born in a high-risk country and you have not received a shot to protect against hepatitis B (hepatitis B vaccine).  You have HIV or AIDS.  You use needles to inject street drugs.  You live with, or have sex with, someone who has hepatitis B.  You are a man who has sex with other men (MSM).  You get hemodialysis treatment.  You take certain medicines for conditions like cancer, organ transplantation, and autoimmune conditions.  Hepatitis C blood testing is recommended for all people born from [birth year] through 1967 and any individual  with known risk factors for hepatitis C.  Healthy men should no longer receive prostate-specific antigen (PSA) blood tests as part of routine cancer screening. Talk to your health care provider about prostate cancer screening.  Testicular cancer screening is not recommended for adolescents or adult males who have no symptoms. Screening includes self-exam, a health care provider exam, and other screening tests. Consult with your health care provider about any symptoms you have or any concerns you have about testicular cancer.  Practice safe sex. Use condoms and avoid high-risk sexual practices to reduce the spread of sexually transmitted infections (STIs).  You should be screened for STIs, including gonorrhea and chlamydia if:  You are sexually active and are younger than 24 years.  You are older than 24 years, and your health care provider tells you that you are at risk for this type of infection.  Your sexual activity has changed since you were last screened, and you are at an  increased risk for chlamydia or gonorrhea. Ask your health care provider if you are at risk.  If you are at risk of being infected with HIV, it is recommended that you take a prescription medicine daily to prevent HIV infection. This is called pre-exposure prophylaxis (PrEP). You are considered at risk if:  You are a man who has sex with other men (MSM).  You are a heterosexual man who is sexually active with multiple partners.  You take drugs by injection.  You are sexually active with a partner who has HIV.  Talk with your health care provider about whether you are at high risk of being infected with HIV. If you choose to begin PrEP, you should first be tested for HIV. You should then be tested every 3 months for as long as you are taking PrEP.  Use sunscreen. Apply sunscreen liberally and repeatedly throughout the day. You should seek shade when your shadow is shorter than you. Protect yourself by wearing long sleeves, pants, a wide-brimmed hat, and sunglasses year round whenever you are outdoors.  Tell your health care provider of new moles or changes in moles, especially if there is a change in shape or color. Also, tell your health care provider if a mole is larger than the size of a pencil eraser.  A one-time screening for abdominal aortic aneurysm (AAA) and surgical repair of large AAAs by ultrasound is recommended for men aged 49-75 years who are current or former smokers.  Stay current with your vaccines (immunizations).   This information is not intended to replace advice given to you by your health care provider. Make sure you discuss any questions you have with your health care provider.   Document Released: 05/02/2008 Document Revised: 11/25/2014 Document Reviewed: 04/01/2011 Elsevier Interactive Patient Education [employer].

## 2015-10-09 NOTE — Assessment & Plan Note (Signed)
At diagnosis was symptomatic with TSH approx 100. Excellent response to medical therapy.  Lab Results  Component Value Date   TSH 1.39 02/16/2015   Recheck TSH and adjust levothyroxine prn

## 2015-10-09 NOTE — Assessment & Plan Note (Signed)
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

## 2015-10-09 NOTE — Assessment & Plan Note (Signed)
On low-dose statin to keep LDL less than 100 given family history Check lipids annually and titrate as needed

## 2015-10-09 NOTE — Assessment & Plan Note (Signed)
Chronic mild elevation thought related to statin, but increasing alkaline phosphatase reviewed Family history of PBC also reviewed Patient asymptomatic. Recheck LFTs now and continue to monitor same

## 2015-10-10 ENCOUNTER — Encounter: Payer: Self-pay | Admitting: Internal Medicine

## 2015-11-16 ENCOUNTER — Encounter: Payer: Self-pay | Admitting: Internal Medicine

## 2015-11-16 NOTE — Telephone Encounter (Signed)
Pt would like a nurse visit for prevnar. Set a my chart message to ask for the day and time that would be convenient for him.   Also, messaged the pt and ask who he would like to switch to as far as PCP goes.

## 2015-11-21 ENCOUNTER — Ambulatory Visit (INDEPENDENT_AMBULATORY_CARE_PROVIDER_SITE_OTHER): Payer: Medicare Other

## 2015-11-21 DIAGNOSIS — Z23 Encounter for immunization: Secondary | ICD-10-CM | POA: Diagnosis not present

## 2015-12-12 ENCOUNTER — Other Ambulatory Visit: Payer: Self-pay | Admitting: Otolaryngology

## 2015-12-12 DIAGNOSIS — H903 Sensorineural hearing loss, bilateral: Secondary | ICD-10-CM

## 2015-12-18 ENCOUNTER — Ambulatory Visit
Admission: RE | Admit: 2015-12-18 | Discharge: 2015-12-18 | Disposition: A | Discharge status: Home / Self Care | Payer: Medicare Other | Source: Ambulatory Visit | Attending: Otolaryngology | Admitting: Otolaryngology

## 2015-12-18 DIAGNOSIS — H903 Sensorineural hearing loss, bilateral: Secondary | ICD-10-CM

## 2015-12-18 DIAGNOSIS — H905 Unspecified sensorineural hearing loss: Secondary | ICD-10-CM | POA: Diagnosis not present

## 2015-12-18 MED ORDER — GADOBENATE DIMEGLUMINE 529 MG/ML IV SOLN: 14.0000 mL | Freq: Once | INTRAVENOUS | Status: DC | PRN

## 2016-03-21 ENCOUNTER — Encounter: Payer: Self-pay | Admitting: Internal Medicine

## 2016-03-21 LAB — LIPID PANEL
CHOLESTEROL: 161 mg/dL (ref 0–200)
HDL: 50 mg/dL (ref 35–70)
LDL Cholesterol: 97 mg/dL
LDL/HDL RATIO: 3.2
Triglycerides: 68 mg/dL (ref 40–160)

## 2016-03-21 LAB — HEPATIC FUNCTION PANEL
ALT: 50 U/L — AB (ref 10–40)
AST: 74 U/L — AB (ref 14–40)
Alkaline Phosphatase: 126 U/L — AB (ref 25–125)
Bilirubin, Total: 0.5 mg/dL

## 2016-03-21 LAB — CBC AND DIFFERENTIAL
HCT: 45 % (ref 41–53)
HEMOGLOBIN: 14.4 g/dL (ref 13.5–17.5)
Platelets: 215 10*3/uL (ref 150–399)
WBC: 7.3 10^3/mL

## 2016-03-21 LAB — BASIC METABOLIC PANEL
BUN: 17 mg/dL (ref 4–21)
Creatinine: 0.9 mg/dL (ref 0.6–1.3)
GLUCOSE: 118 mg/dL
POTASSIUM: 4.4 mmol/L (ref 3.4–5.3)
SODIUM: 141 mmol/L (ref 137–147)

## 2016-03-21 LAB — TSH: TSH: 0.76 u[IU]/mL (ref 0.41–5.90)

## 2016-03-21 LAB — PSA: PSA: 0.56

## 2016-04-19 ENCOUNTER — Ambulatory Visit (INDEPENDENT_AMBULATORY_CARE_PROVIDER_SITE_OTHER): Payer: Medicare Other | Admitting: Internal Medicine

## 2016-04-19 DIAGNOSIS — Z23 Encounter for immunization: Secondary | ICD-10-CM | POA: Diagnosis not present

## 2016-04-19 DIAGNOSIS — Z9189 Other specified personal risk factors, not elsewhere classified: Secondary | ICD-10-CM

## 2016-04-19 DIAGNOSIS — Z7189 Other specified counseling: Secondary | ICD-10-CM | POA: Diagnosis not present

## 2016-04-19 DIAGNOSIS — IMO0002 Reserved for concepts with insufficient information to code with codable children: Secondary | ICD-10-CM

## 2016-04-19 MED ORDER — TYPHOID VACCINE PO CPDR: 1.0000 | DELAYED_RELEASE_CAPSULE | ORAL | Status: DC

## 2016-04-19 MED ORDER — CIPROFLOXACIN HCL 500 MG PO TABS: 500.0000 mg | ORAL_TABLET | Freq: Two times a day (BID) | ORAL | Status: DC

## 2016-04-19 MED ORDER — ATOVAQUONE-PROGUANIL HCL 250-100 MG PO TABS: 1.0000 | ORAL_TABLET | Freq: Every day | ORAL | Status: DC

## 2016-04-19 NOTE — Progress Notes (Signed)
  RFV: pre travel counseling for upcoming trip to [place] Subjective:    Patient ID: William Luna, male    DOB: 06/24/1951, 65 y.o.   MRN: [id number]  HPI  65yo M with hx of htn, hypothyroidism, scca of tongue s/p resection. Who will be doing a trip to [place] from oct 1 thru oct 21st. Will spend 10 d on safari in [place], then 8 days in [place], [place], and then [place] for 3 days.  uptodate on hep b, tdap, and gets flu vaccine every year  Prior travel: [place], [place], [place], [place], [place], [place]  Review of Systems     Objective:   Physical Exam        Assessment & Plan:  Pre travel vaccination = will give hep a, as well as do oral typhoid vaccine  Malaria proph = will give malarone  Traveler's diarrhea= will give rx for cipro

## 2016-04-30 ENCOUNTER — Emergency Department (HOSPITAL_COMMUNITY): Payer: Medicare Other

## 2016-04-30 ENCOUNTER — Emergency Department (HOSPITAL_COMMUNITY)
Admission: EM | Admit: 2016-04-30 | Discharge: 2016-04-30 | Disposition: A | Discharge status: Home / Self Care | Payer: Medicare Other | Attending: Emergency Medicine | Admitting: Emergency Medicine

## 2016-04-30 ENCOUNTER — Encounter (HOSPITAL_COMMUNITY): Payer: Self-pay | Admitting: *Deleted

## 2016-04-30 DIAGNOSIS — K219 Gastro-esophageal reflux disease without esophagitis: Secondary | ICD-10-CM | POA: Insufficient documentation

## 2016-04-30 DIAGNOSIS — R079 Chest pain, unspecified: Secondary | ICD-10-CM | POA: Diagnosis not present

## 2016-04-30 DIAGNOSIS — Z79899 Other long term (current) drug therapy: Secondary | ICD-10-CM | POA: Diagnosis not present

## 2016-04-30 DIAGNOSIS — Z7982 Long term (current) use of aspirin: Secondary | ICD-10-CM | POA: Insufficient documentation

## 2016-04-30 DIAGNOSIS — IMO0001 Reserved for inherently not codable concepts without codable children: Secondary | ICD-10-CM

## 2016-04-30 DIAGNOSIS — Z85810 Personal history of malignant neoplasm of tongue: Secondary | ICD-10-CM | POA: Insufficient documentation

## 2016-04-30 DIAGNOSIS — R12 Heartburn: Secondary | ICD-10-CM | POA: Diagnosis not present

## 2016-04-30 LAB — I-STAT TROPONIN, ED
TROPONIN I, POC: 0.01 ng/mL (ref 0.00–0.08)
Troponin i, poc: 0 ng/mL (ref 0.00–0.08)

## 2016-04-30 LAB — CBC
HCT: 41.6 % (ref 39.0–52.0)
Hemoglobin: 13.6 g/dL (ref 13.0–17.0)
MCH: 28.5 pg (ref 26.0–34.0)
MCHC: 32.7 g/dL (ref 30.0–36.0)
MCV: 87 fL (ref 78.0–100.0)
PLATELETS: 214 10*3/uL (ref 150–400)
RBC: 4.78 MIL/uL (ref 4.22–5.81)
RDW: 13.9 % (ref 11.5–15.5)
WBC: 5.6 10*3/uL (ref 4.0–10.5)

## 2016-04-30 LAB — BASIC METABOLIC PANEL
Anion gap: 7 (ref 5–15)
BUN: 15 mg/dL (ref 6–20)
CALCIUM: 9.9 mg/dL (ref 8.9–10.3)
CHLORIDE: 102 mmol/L (ref 101–111)
CO2: 28 mmol/L (ref 22–32)
CREATININE: 0.76 mg/dL (ref 0.61–1.24)
GFR calc Af Amer: >60 mL/min (ref >60)
GFR calc non Af Amer: >60 mL/min (ref >60)
GLUCOSE: 114 mg/dL — AB (ref 65–99)
Potassium: 4.1 mmol/L (ref 3.5–5.1)
Sodium: 137 mmol/L (ref 135–145)

## 2016-04-30 MED ORDER — FAMOTIDINE 20 MG PO TABS
20.0000 mg | ORAL_TABLET | Freq: Once | ORAL | Status: AC
  Administered 2016-04-30: 20 mg via ORAL
  Filled 2016-04-30: qty 1

## 2016-04-30 MED ORDER — GI COCKTAIL ~~LOC~~
30.0000 mL | Freq: Once | ORAL | Status: AC
  Administered 2016-04-30: 30 mL via ORAL

## 2016-04-30 MED ORDER — PANTOPRAZOLE SODIUM 40 MG PO TBEC
40.0000 mg | DELAYED_RELEASE_TABLET | Freq: Every day | ORAL | Status: DC
  Administered 2016-04-30: 40 mg via ORAL
  Filled 2016-04-30: qty 1

## 2016-04-30 NOTE — ED Provider Notes (Signed)
CSN: [id number]     Arrival date & time 04/30/16  [id number] History   First MD Initiated Contact with Patient 04/30/16 [phone]     Chief Complaint  Patient presents with  . Chest Pain     (Consider location/radiation/quality/duration/timing/severity/associated sxs/prior Treatment) HPI  65 year old male who presents with chest pain. 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 States that he also has a history of GERD, with a sensation of mild burning in his throat. He states that yesterday evening he was somewhat noncompliant with his diet, eating later, and eating some foods that may flare up his reflux symptoms. This morning woke up in his usual state of health but upon leaving home from work and to note a severe sensation of burning in the back of his throat, localized to the right side, and inconsistent with prior history of GERD. At work, tried to eat something, but did not have improving symptoms. He was concerned about his anginal equivalent and subsequently came to the ED for evaluation. Denies associated shortness of breath, diaphoresis, vomiting. States that he has been very exertional recently, working in the yard and his garden, and has not had any equivalent symptoms, chest pain, difficulty breathing or extreme fatigue. He received a GI cocktail in triage, and states that he only has a mild sensation of burning down the back of his throat. Past Medical History  Diagnosis Date  . Hypothyroidism   . ANEMIA, IRON DEFICIENCY   . CERVICAL RADICULOPATHY     disectomy in 1990s  . TRANSAMINASES, SERUM, ELEVATED   . GERD     EGD negative x 3, off medication (July '13)  . HYPERLIPIDEMIA, MILD     lipitor 10mg    . Squamous cell cancer of tongue [facility]) Dec '12 Janace Hoard)    mid-tongue squamous cell - excised, clean margins, 11 nodes negative, no adjuvant therapy  . DDD (degenerative disc disease),  lumbar oct '12    L4-5, ESI therapy successful 10/12; L5-S1 radiculopathy 3/13   Past Surgical History  Procedure Laterality Date  . Back surgery  '90s ([place])    CERVICAL DISC - diskectomy w/ fusion, anterior  . Hemiglossectomy  10/30/2011    Procedure: HEMIGLOSSECTOMY;  Surgeon: Cecil Cranker;  Location: [facility] OR;  Service: ENT;  Laterality: Right;  Right tongue resection  . Radical neck dissection  10/30/2011    Procedure: RADICAL NECK DISSECTION;  Surgeon: Cecil Cranker;  Location: [facility] OR;  Service: ENT;  Laterality: Right;  . Lumbar laminectomy/decompression microdiscectomy  01/29/2012    Procedure: LUMBAR LAMINECTOMY/DECOMPRESSION MICRODISCECTOMY;  Surgeon: Kristeen Miss, MD;  Location: [place] [facility]UR[facility] OR[facility];  Service: Neurosurgery;  Laterality: Left;  Left L5-S1 Microdiskectomy  . Nail avulsion      right first finger due to a wart  . Lumbar laminectomy/decompression microdiscectomy  07/13/2012    Procedure: LUMBAR LAMINECTOMY/DECOMPRESSION MICRODISCECTOMY 1 LEVEL;  Surgeon: Kristeen Miss, MD;  Location: [place] [facility]UR[facility] ORS;  Service: Neurosurgery;  Laterality: Left;  Redo Left Lumbar five-sacral one Microdiskectomy   Family History  Problem Relation Age of Onset  . Diabetes Mother   . Anemia Mother   . Cirrhosis Mother 24    primary biliary cirrhosis  . Hyperlipidemia Father   . Hypertension Father   . Heart disease Father   . Colon cancer Maternal Grandfather   . COPD Neg Hx    Social History  Substance Use Topics  .  Smoking status: Never Smoker   . Smokeless tobacco: Never Used  . Alcohol Use: 0.0 oz/week    0 Cans of beer per week     Comment: OCC.    Review of Systems 10/14 systems reviewed and are negative other than those stated in the HPI    Allergies  Review of patient's allergies indicates no known allergies.  Home Medications   Prior to Admission medications   Medication Sig Start Date End Date Taking? Authorizing Provider  aspirin EC 81 MG tablet Take 81 mg by mouth.  Patient takes three times a week. Monday, Wednesday and Saturday.    Historical Provider, MD  atorvastatin (LIPITOR) 10 MG tablet Take 1 tablet (10 mg total) by mouth daily. 08/28/15   Rowe Clack, MD  atovaquone-proguanil (MALARONE) 250-100 MG TABS tablet Take 1 tablet by mouth daily. Start taking 2 days prior to travel. Take daily on full stomach until complete 04/19/16   Carlyle Basques, MD  ciprofloxacin (CIPRO) 500 MG tablet Take 1 tablet (500 mg total) by mouth 2 (two) times daily. If you have 3+ loose stools in 24hr. Can stop taking if diarrhea resolves 04/19/16   Carlyle Basques, MD  levothyroxine (SYNTHROID, LEVOTHROID) 112 MCG tablet Take 1 tablet (112 mcg total) by mouth daily. 08/28/15   Rowe Clack, MD  sertraline (ZOLOFT) 50 MG tablet 50 mg (1 tab) by mouth every other day alternating with 75 mg (1.5 tabs) by mouth every other day 08/28/15   Rowe Clack, MD  typhoid (VIVOTIF) DR capsule Take 1 capsule by mouth every other day. With room temperature water. Keep refrigerated 04/19/16   Carlyle Basques, MD   BP 123/74 mmHg  Pulse 58  Temp(Src) 98.3 F (36.8 C) (Oral)  Resp 18  Ht 5\' 8"  (1.727 m)  Wt 150 lb (68.04 kg)  BMI 22.81 kg/m2  SpO2 100% Physical Exam Physical Exam  Nursing note and vitals reviewed. Constitutional: Well developed, well nourished, non-toxic, and in no acute distress Head: Normocephalic and atraumatic.  Mouth/Throat: Oropharynx is clear and moist.  Neck: Normal range of motion. Neck supple.  Cardiovascular: Normal rate and regular rhythm.  No lower extremity edema. Pulmonary/Chest: Effort normal and breath sounds normal.  Abdominal: Soft. There is no tenderness. There is no rebound and no guarding.  Musculoskeletal: Normal range of motion.  Neurological: Alert, no facial droop, fluent speech, moves all extremities symmetrically Skin: Skin is warm and dry.  Psychiatric: Cooperative  ED Course  Procedures (including critical care time) Labs  Review Labs Reviewed  BASIC METABOLIC PANEL - Abnormal; Notable for the following:    Glucose, Bld 114 (*)    All other components within normal limits  CBC  I-STAT TROPOININ, ED  Randolm Idol, ED    Imaging Review Dg Chest 2 View  04/30/2016  CLINICAL DATA:  Heartburn EXAM: CHEST  2 VIEW COMPARISON:  01/09/2012 FINDINGS: Normal heart size. Bilateral nipple shadows are noted. Lungs clear. No pneumothorax. No pleural effusion. IMPRESSION: No active cardiopulmonary disease. Electronically Signed   By: Marybelle Killings M.D.   On: 04/30/2016 07:23   I have personally reviewed and evaluated these images and lab results as part of my medical decision-making.   EKG Interpretation   Date/Time:  Tuesday April 30 2016 09:58:19 EDT Ventricular Rate:  58 PR Interval:  138 QRS Duration: 107 QT Interval:  412 QTC Calculation: 405 R Axis:   88 Text Interpretation:  Sinus rhythm Borderline right axis deviation ST  elevation,  consider inferior injury No ST elevation. Similar to prior EKG.   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 With some mild residual burning in the back of his throat after GI cocktail of my evaluation, and has full resolution of symptoms after receiving Pepcid and Protonix in the ED. His EKG does not show any ischemic changes, and serial EKG without dynamic changes. Serial 3 hour troponins are also negative. Chest x-ray shows no acute cardiopulmonary processes. He has an overall heart score of 3, considered lower risk for potential ACS. Symptoms otherwise not suggestive of other cardiopulmonary or intrathoracic processes such as PE or dissection. 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 It is reasonable at this time to continue outpatient work-up and follow-up with his PCP and potential  cardiologist. He will return to ED for recurrent or worsening symptoms.     Forde Dandy, MD 04/30/16 1031

## 2016-04-30 NOTE — Discharge Instructions (Signed)
Your EKGs and serial cardiac enzymes today looked good.  Return for worsening symptoms, including difficulty breathing, persistent or worsening pain, or any other symptoms concerning to you.  Follow-up with your primary care provider regarding further testing and work-up.  Nonspecific Chest Pain It is often hard to find the cause of chest pain. There is always a chance that your pain could be related to something serious, such as a heart attack or a blood clot in your lungs. Chest pain can also be caused by conditions that are not life-threatening. If you have chest pain, it is very important to follow up with your doctor.  HOME CARE  If you were prescribed an antibiotic medicine, finish it all even if you start to feel better.  Avoid any activities that cause chest pain.  Do not use any tobacco products, including cigarettes, chewing tobacco, or electronic cigarettes. If you need help quitting, ask your doctor.  Do not drink alcohol.  Take medicines only as told by your doctor.  Keep all follow-up visits as told by your doctor. This is important. This includes any further testing if your chest pain does not go away.  Your doctor may tell you to keep your head raised (elevated) while you sleep.  Make lifestyle changes as told by your doctor. These may include:  Getting regular exercise. Ask your doctor to suggest some activities that are safe for you.  Eating a heart-healthy diet. Your doctor or a diet specialist (dietitian) can help you to learn healthy eating options.  Maintaining a healthy weight.  Managing diabetes, if necessary.  Reducing stress. GET HELP IF:  Your chest pain does not go away, even after treatment.  You have a rash with blisters on your chest.  You have a fever. GET HELP RIGHT AWAY IF:  Your chest pain is worse.  You have an increasing cough, or you cough up blood.  You have severe belly (abdominal) pain.  You feel extremely weak.  You pass  out (faint).  You have chills.  You have sudden, unexplained chest discomfort.  You have sudden, unexplained discomfort in your arms, back, neck, or jaw.  You have shortness of breath at any time.  You suddenly start to sweat, or your skin gets clammy.  You feel nauseous.  You vomit.  You suddenly feel light-headed or dizzy.  Your heart begins to beat quickly, or it feels like it is skipping beats. These symptoms may be an emergency. Do not wait to see if the symptoms will go away. Get medical help right away. Call your local emergency services (911 in the [place] Do not drive yourself to the hospital.   This information is not intended to replace advice given to you by your health care provider. Make sure you discuss any questions you have with your health care provider.   Document Released: 04/22/2008 Document Revised: 11/25/2014 Document Reviewed: 06/10/2014 Elsevier Interactive Patient Education [employer].

## 2016-04-30 NOTE — ED Notes (Signed)
Patient presents stating he started with a burning sensation in his throat

## 2016-05-17 ENCOUNTER — Telehealth: Payer: Self-pay | Admitting: *Deleted

## 2016-05-17 NOTE — Telephone Encounter (Signed)
Pharmacy preference updated. Landis Gandy, RN

## 2016-07-04 ENCOUNTER — Encounter: Payer: Self-pay | Admitting: Sports Medicine

## 2016-07-04 ENCOUNTER — Ambulatory Visit (INDEPENDENT_AMBULATORY_CARE_PROVIDER_SITE_OTHER): Payer: Medicare Other | Admitting: Sports Medicine

## 2016-07-04 DIAGNOSIS — M79661 Pain in right lower leg: Secondary | ICD-10-CM | POA: Insufficient documentation

## 2016-07-04 NOTE — Progress Notes (Signed)
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  ROS No current sciatica No prior calf injury  PE NAD BP 116/77   Pulse (!) 58   Ht 5\' 8"  (1.727 m)   Wt 150 lb (68 kg)   BMI 22.81 kg/m   RT Mild TTP that is in upper mid calf between bellies of gastrocs No TTP at [employer] jxn with AT med or lat NO AT pain Large varicosity cluster along med gastroc and in popliteal space No discoration Pain with attempting heel raise  LT Some atrophy No TTP  [place] superfical varicosities noted with no sign of bleeding Med gastroc appears norm Lat Gastroc appears norm Soleus appears noram  Upper plataris shows small mm belly with hypoechoic change and fiber disruption in plantaris mm belly

## 2016-07-04 NOTE — Assessment & Plan Note (Signed)
We will use compression sleeve Heel lifts Start with calf raises with eccentric strech on step Use both legs and progress to 1 leg Hold tennis for 2 weeks and ease back in  We can try NTG if not healing well  Reck in 1 month

## 2016-07-24 ENCOUNTER — Telehealth: Payer: Self-pay

## 2016-07-24 MED ORDER — SERTRALINE HCL 50 MG PO TABS: ORAL_TABLET | ORAL | 0 refills | Status: DC

## 2016-07-24 MED ORDER — ATORVASTATIN CALCIUM 10 MG PO TABS: 10.0000 mg | ORAL_TABLET | Freq: Every day | ORAL | 0 refills | Status: DC

## 2016-07-24 MED ORDER — LEVOTHYROXINE SODIUM 112 MCG PO TABS: 112.0000 ug | ORAL_TABLET | Freq: Every day | ORAL | 0 refills | Status: DC

## 2016-07-24 NOTE — Telephone Encounter (Signed)
rq for refills prior to appt. erx sent to [facility]

## 2016-08-01 ENCOUNTER — Encounter: Payer: Self-pay | Admitting: Sports Medicine

## 2016-08-01 ENCOUNTER — Ambulatory Visit (INDEPENDENT_AMBULATORY_CARE_PROVIDER_SITE_OTHER): Payer: Medicare Other | Admitting: Sports Medicine

## 2016-08-01 ENCOUNTER — Other Ambulatory Visit: Payer: Self-pay

## 2016-08-01 VITALS — BP 104/65 | HR 66 | Ht 68.0 in | Wt 150.0 lb

## 2016-08-01 DIAGNOSIS — M79661 Pain in right lower leg: Secondary | ICD-10-CM

## 2016-08-01 NOTE — Assessment & Plan Note (Signed)
On ultrasound his plantaris tear appears to be resolved. He can progress as tolerated back into full activities including tennis. He'll follow-up as needed. Recommended wearing compression sleeve during physical activity and for 30 minutes to an hour after physical activity.

## 2016-08-01 NOTE — Progress Notes (Signed)
  William Luna - 65 y.o. male MRN [id number]  Date of birth: November 13, 1951  SUBJECTIVE:  Including CC & ROS.  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 He was diagnosed with a plantar's tear at his last visit. He is doing really well with it since that time and it is been doing his exercises religiously every day. He has progressed to the biking and doing yard work without any pain at all. He has not gone back to tennis which is where he sustained the original injury. States that his pain used to be near his varicose veins but is no longer there.  ROS: No unexpected weight loss, fever, chills, swelling, instability, muscle pain, numbness/tingling, redness, otherwise see HPI   PMHx - Updated and reviewed.  Contributory factors include: Lumbar radiculopathy PSHx - Updated and reviewed.  Contributory factors include:  Negative FHx - Updated and reviewed.  Contributory factors include:  Negative Social Hx - Updated and reviewed. 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  Psych:  alert and oriented  Ankle & Foot: No visible swelling, ecchymosis, erythema, ulcers, calluses, blister, but did have varicose veins on his right calf, on the posterolateral aspect  Arch: Normal w/o pes cavus or planus  Achilles tendon without nodules or tenderness No swelling of retrocalcaneal bursa No pain at MT heads No pain at base of 5th MT; No tenderness over cuboid; No tenderness over N spot or navicular prominence No tenderness on lateral and medial malleolus No  sign of peroneal tendon subluxations or tenderness to palpation Full in plantarflexion, dorsiflexion, inversion, and eversion of the foot; flexion and extension of the toes Strength: 5/5 in all directions. Sensation: intact Vascular: intact w/ dorsalis pedis & posterior tibialis pulses 2+ Stable lateral and medial ligaments; Negative Anterior drawer test  Ultrasound : Limited ultrasound of his right calf was performed. The plantaris muscle was viewed and long and short axis which revealed filling in from previous ultrasound and minimal hypoechoic changes at the muscle belly. His medial gastroc revealed some hypoechoic changes near his varicose veins. There were no other hypoechoic changes or tears seen in the medial gastrocnemius. Lateral gastrocnemius did not have any hypo-or hyperbaric changes present. Tendon appeared intact.  ASSESSMENT & PLAN:   Right calf pain On ultrasound his plantaris tear appears to be resolved. He can progress as tolerated back into full activities including tennis. He'll follow-up as needed. Recommended wearing compression sleeve during physical activity and for 30 minutes to an hour after physical activity.

## 2016-08-13 ENCOUNTER — Other Ambulatory Visit: Payer: Self-pay | Admitting: Internal Medicine

## 2016-08-13 ENCOUNTER — Encounter: Payer: Self-pay | Admitting: Internal Medicine

## 2016-08-13 DIAGNOSIS — F409 Phobic anxiety disorder, unspecified: Secondary | ICD-10-CM | POA: Insufficient documentation

## 2016-08-13 DIAGNOSIS — F5105 Insomnia due to other mental disorder: Principal | ICD-10-CM

## 2016-08-13 MED ORDER — ZALEPLON 10 MG PO CAPS: 10.0000 mg | ORAL_CAPSULE | Freq: Every evening | ORAL | 3 refills | Status: DC | PRN

## 2016-10-01 ENCOUNTER — Encounter: Payer: Self-pay | Admitting: Internal Medicine

## 2016-10-01 ENCOUNTER — Other Ambulatory Visit (INDEPENDENT_AMBULATORY_CARE_PROVIDER_SITE_OTHER): Payer: Medicare Other

## 2016-10-01 ENCOUNTER — Ambulatory Visit (INDEPENDENT_AMBULATORY_CARE_PROVIDER_SITE_OTHER): Payer: Medicare Other | Admitting: Internal Medicine

## 2016-10-01 VITALS — BP 108/68 | HR 66 | Temp 97.6°F | Resp 16 | Ht 68.0 in | Wt 156.2 lb

## 2016-10-01 DIAGNOSIS — Z0000 Encounter for general adult medical examination without abnormal findings: Secondary | ICD-10-CM

## 2016-10-01 DIAGNOSIS — E034 Atrophy of thyroid (acquired): Secondary | ICD-10-CM | POA: Insufficient documentation

## 2016-10-01 DIAGNOSIS — R945 Abnormal results of liver function studies: Secondary | ICD-10-CM

## 2016-10-01 DIAGNOSIS — K219 Gastro-esophageal reflux disease without esophagitis: Secondary | ICD-10-CM

## 2016-10-01 DIAGNOSIS — E785 Hyperlipidemia, unspecified: Secondary | ICD-10-CM

## 2016-10-01 DIAGNOSIS — N400 Enlarged prostate without lower urinary tract symptoms: Secondary | ICD-10-CM

## 2016-10-01 DIAGNOSIS — R7989 Other specified abnormal findings of blood chemistry: Secondary | ICD-10-CM

## 2016-10-01 DIAGNOSIS — R739 Hyperglycemia, unspecified: Secondary | ICD-10-CM

## 2016-10-01 LAB — URINALYSIS, ROUTINE W REFLEX MICROSCOPIC
Bilirubin Urine: NEGATIVE
Hgb urine dipstick: NEGATIVE
Ketones, ur: NEGATIVE
Leukocytes, UA: NEGATIVE
Nitrite: NEGATIVE
PH: 7.5 (ref 5.0–8.0)
RBC / HPF: NONE SEEN (ref 0–?)
SPECIFIC GRAVITY, URINE: 1.015 (ref 1.000–1.030)
Total Protein, Urine: NEGATIVE
Urine Glucose: NEGATIVE
Urobilinogen, UA: 0.2 (ref 0.0–1.0)

## 2016-10-01 LAB — LIPID PANEL
CHOLESTEROL: 156 mg/dL (ref 0–200)
HDL: 53.8 mg/dL (ref >39.00)
LDL Cholesterol: 89 mg/dL (ref 0–99)
NONHDL: 102.05
Total CHOL/HDL Ratio: 3
Triglycerides: 64 mg/dL (ref 0.0–149.0)
VLDL: 12.8 mg/dL (ref 0.0–40.0)

## 2016-10-01 LAB — COMPREHENSIVE METABOLIC PANEL
ALK PHOS: 144 U/L — AB (ref 39–117)
ALT: 43 U/L (ref 0–53)
AST: 42 U/L — ABNORMAL HIGH (ref 0–37)
Albumin: 4.3 g/dL (ref 3.5–5.2)
BILIRUBIN TOTAL: 0.6 mg/dL (ref 0.2–1.2)
BUN: 17 mg/dL (ref 6–23)
CO2: 33 mEq/L — ABNORMAL HIGH (ref 19–32)
Calcium: 9.7 mg/dL (ref 8.4–10.5)
Chloride: 103 mEq/L (ref 96–112)
Creatinine, Ser: 0.91 mg/dL (ref 0.40–1.50)
GFR: 88.17 mL/min (ref >60.00)
GLUCOSE: 98 mg/dL (ref 70–99)
Potassium: 4.8 mEq/L (ref 3.5–5.1)
SODIUM: 142 meq/L (ref 135–145)
TOTAL PROTEIN: 7.6 g/dL (ref 6.0–8.3)

## 2016-10-01 LAB — CBC WITH DIFFERENTIAL/PLATELET
BASOS ABS: 0 10*3/uL (ref 0.0–0.1)
Basophils Relative: 0.2 % (ref 0.0–3.0)
EOS ABS: 0.5 10*3/uL (ref 0.0–0.7)
Eosinophils Relative: 6.3 % — ABNORMAL HIGH (ref 0.0–5.0)
HCT: 44.6 % (ref 39.0–52.0)
Hemoglobin: 14.9 g/dL (ref 13.0–17.0)
LYMPHS ABS: 1.8 10*3/uL (ref 0.7–4.0)
Lymphocytes Relative: 22 % (ref 12.0–46.0)
MCHC: 33.5 g/dL (ref 30.0–36.0)
MCV: 85.4 fl (ref 78.0–100.0)
MONO ABS: 0.7 10*3/uL (ref 0.1–1.0)
Monocytes Relative: 8.1 % (ref 3.0–12.0)
NEUTROS PCT: 63.4 % (ref 43.0–77.0)
Neutro Abs: 5.2 10*3/uL (ref 1.4–7.7)
Platelets: 239 10*3/uL (ref 150.0–400.0)
RBC: 5.23 Mil/uL (ref 4.22–5.81)
RDW: 14.2 % (ref 11.5–15.5)
WBC: 8.2 10*3/uL (ref 4.0–10.5)

## 2016-10-01 LAB — HEMOGLOBIN A1C: HEMOGLOBIN A1C: 5.8 % (ref 4.6–6.5)

## 2016-10-01 LAB — TSH: TSH: 0.56 u[IU]/mL (ref 0.35–4.50)

## 2016-10-01 LAB — PSA: PSA: 0.53 ng/mL (ref 0.10–4.00)

## 2016-10-01 MED ORDER — DUTASTERIDE 0.5 MG PO CAPS: 0.5000 mg | ORAL_CAPSULE | Freq: Every day | ORAL | 3 refills | Status: DC

## 2016-10-01 NOTE — Patient Instructions (Signed)

## 2016-10-01 NOTE — Progress Notes (Signed)
Pre visit review using our clinic review tool, if applicable. No additional management support is needed unless otherwise documented below in the visit note. 

## 2016-10-01 NOTE — Progress Notes (Signed)
Subjective:  Patient ID: William Luna, male    DOB: 1951/03/07  Age: 65 y.o. MRN: [id number]  CC: Hyperlipidemia; Annual Exam; and Benign Prostatic Hypertrophy   HPI William Luna presents for an AWV/CPX.  He has a long-standing history of mildly elevated liver enzymes. He was previously tested for hepatitis C and has been negative. He has never had an image done of his liver. He does not report right upper quadrant pain or any other symptoms suspicious of liver disease. He rarely has a beer. He does take a statin.  Over the last few months he has developed symptoms of obstructive uropathy complaining of a weak urine stream with an occasional dribble and straining. He denies dysuria, hematuria, nocturia.   Past Medical History:  Diagnosis Date  . ANEMIA, IRON DEFICIENCY   . CERVICAL RADICULOPATHY    disectomy in 1990s  . DDD (degenerative disc disease), lumbar oct '12   L4-5, ESI therapy successful 10/12; L5-S1 radiculopathy 3/13  . GERD    EGD negative x 3, off medication (July '13)  . HYPERLIPIDEMIA, MILD    lipitor 10mg    . Hypothyroidism   . 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 TRANSAMINASES, SERUM, ELEVATED    Past Surgical History:  Procedure Laterality Date  . BACK SURGERY  '90s ([place])   CERVICAL DISC - diskectomy w/ fusion, anterior  . HEMIGLOSSECTOMY  10/30/2011   Procedure: HEMIGLOSSECTOMY;  Surgeon: William Luna;  Location: [facility] OR;  Service: ENT;  Laterality: Right;  Right tongue resection  . LUMBAR LAMINECTOMY/DECOMPRESSION MICRODISCECTOMY  01/29/2012   Procedure: LUMBAR LAMINECTOMY/DECOMPRESSION MICRODISCECTOMY;  Surgeon: William Miss, MD;  Location: [place] [facility] ORS;  Service: Neurosurgery;  Laterality: Left;  Left L5-S1 Microdiskectomy  . LUMBAR LAMINECTOMY/DECOMPRESSION MICRODISCECTOMY  07/13/2012   Procedure: LUMBAR LAMINECTOMY/DECOMPRESSION  MICRODISCECTOMY 1 LEVEL;  Surgeon: William Miss, MD;  Location: [place] NEUR[facility] ORS;  Service: Neurosurgery;  Laterality: Left;  Redo Left Lumbar five-sacral one Microdiskectomy  . nail avulsion     right first finger due to a wart  . RADICAL NECK DISSECTION  10/30/2011   Procedure: RADICAL NECK DISSECTION;  Surgeon: William Luna;  Location: [facility] OR;  Service: ENT;  Laterality: Right;    reports that he has never smoked. He has never used smokeless tobacco. He reports that he drinks alcohol. He reports that he does not use drugs. family history includes Anemia in his mother; Cirrhosis (age of onset: 83) in his mother; Colon cancer in his maternal grandfather; Diabetes in his mother; Heart disease in his father; Hyperlipidemia in his father; Hypertension in his father. No Known Allergies  Outpatient Medications Prior to Visit  Medication Sig Dispense Refill  . aspirin EC 81 MG tablet Take 81 mg by mouth. Patient takes three times a week. Monday, Wednesday and Saturday.    Marland Kitchen atorvastatin (LIPITOR) 10 MG tablet Take 1 tablet (10 mg total) by mouth daily. 90 tablet 0  . levothyroxine (SYNTHROID, LEVOTHROID) 112 MCG tablet Take 1 tablet (112 mcg total) by mouth daily. 90 tablet 0  . sertraline (ZOLOFT) 50 MG tablet 50 mg (1 tab) by mouth every other day alternating with 75 mg (1.5 tabs) by mouth every other day 135 tablet 0  . zaleplon (SONATA) 10 MG capsule Take 1 capsule (10 mg total) by mouth at bedtime as needed for sleep. 30 capsule 3  . atovaquone-proguanil (MALARONE)  250-100 MG TABS tablet Take 1 tablet by mouth daily. Start taking 2 days prior to travel. Take daily on full stomach until complete 19 tablet 0  . ciprofloxacin (CIPRO) 500 MG tablet Take 1 tablet (500 mg total) by mouth 2 (two) times daily. If you have 3+ loose stools in 24hr. Can stop taking if diarrhea resolves 10 tablet 0  . typhoid (VIVOTIF) DR capsule Take 1 capsule by mouth every other day. With room temperature water. Keep  refrigerated 4 capsule 0   No facility-administered medications prior to visit.     ROS Review of Systems  Constitutional: Negative.  Negative for appetite change, chills, fatigue and unexpected weight change.  HENT: Negative.  Negative for sinus pressure, sore throat, trouble swallowing and voice change.   Eyes: Negative.   Respiratory: Negative.  Negative for cough, choking, chest tightness and stridor.   Cardiovascular: Negative.  Negative for chest pain, palpitations and leg swelling.  Gastrointestinal: Negative.  Negative for abdominal pain, blood in stool, constipation, diarrhea, nausea and vomiting.  Endocrine: Negative.   Genitourinary: Positive for difficulty urinating. Negative for dysuria, frequency, hematuria, penile pain, penile swelling, scrotal swelling, testicular pain and urgency.  Musculoskeletal: Negative.  Negative for arthralgias, back pain, myalgias and neck pain.  Skin: Negative.   Allergic/Immunologic: Negative.   Neurological: Negative.   Hematological: Negative.  Negative for adenopathy. Does not bruise/bleed easily.  Psychiatric/Behavioral: Negative.     Objective:  BP 108/68 (BP Location: Left Arm, Patient Position: Sitting, Cuff Size: Normal)   Pulse 66   Temp 97.6 F (36.4 C) (Oral)   Resp 16   Ht 5\' 8"  (1.727 m)   Wt 156 lb 4 oz (70.9 kg)   SpO2 97%   BMI 23.76 kg/m   BP Readings from Last 3 Encounters:  10/01/16 108/68  08/01/16 104/65  07/04/16 116/77    Wt Readings from Last 3 Encounters:  10/01/16 156 lb 4 oz (70.9 kg)  08/01/16 150 lb (68 kg)  07/04/16 150 lb (68 kg)    Physical Exam  Constitutional: He is oriented to person, place, and time. No distress.  HENT:  Nose: Nose normal.  Mouth/Throat: Oropharynx is clear and moist. No oropharyngeal exudate.  Eyes: Conjunctivae are normal. Right eye exhibits no discharge. Left eye exhibits no discharge. No scleral icterus.  Neck: Normal range of motion. Neck supple. No JVD present. No  tracheal deviation present. No thyromegaly present.  Cardiovascular: Normal rate, regular rhythm, normal heart sounds and intact distal pulses.  Exam reveals no gallop and no friction rub.   No murmur heard. Pulmonary/Chest: Effort normal and breath sounds normal. No stridor. No respiratory distress. He has no wheezes. He has no rales. He exhibits no tenderness.  Abdominal: Soft. Bowel sounds are normal. He exhibits no distension and no mass. There is no tenderness. There is no rebound and no guarding. Hernia confirmed negative in the right inguinal area and confirmed negative in the left inguinal area.  Genitourinary: Rectum normal, testes normal and penis normal. Rectal exam shows no external hemorrhoid, no internal hemorrhoid, no fissure, no mass, no tenderness and guaiac negative stool. Prostate is enlarged (2+ smooth symm BPH). Prostate is not tender. Right testis shows no mass, no swelling and no tenderness. Right testis is descended. Left testis shows no mass, no swelling and no tenderness. Left testis is descended. Circumcised. No penile erythema or penile tenderness. No discharge found.  Musculoskeletal: Normal range of motion. He exhibits no edema, tenderness or deformity.  Lymphadenopathy:    He has no cervical adenopathy.       Right: No inguinal adenopathy present.       Left: No inguinal adenopathy present.  Neurological: He is oriented to person, place, and time.  Skin: Skin is warm and dry. No rash noted. He is not diaphoretic. No erythema. No pallor.  Psychiatric: He has a normal mood and affect. His behavior is normal. Judgment and thought content normal.  Vitals reviewed.   Lab Results  Component Value Date   WBC 8.2 10/01/2016   HGB 14.9 10/01/2016   HCT 44.6 10/01/2016   PLT 239.0 10/01/2016   GLUCOSE 98 10/01/2016   CHOL 156 10/01/2016   TRIG 64.0 10/01/2016   HDL 53.80 10/01/2016   LDLDIRECT 134.5 12/15/2006   LDLCALC 89 10/01/2016   ALT 43 10/01/2016   AST 42 (H)  10/01/2016   NA 142 10/01/2016   K 4.8 10/01/2016   CL 103 10/01/2016   CREATININE 0.91 10/01/2016   BUN 17 10/01/2016   CO2 33 (H) 10/01/2016   TSH 0.56 10/01/2016   PSA 0.53 10/01/2016   HGBA1C 5.8 10/01/2016    Dg Chest 2 View  Result Date: 04/30/2016 CLINICAL DATA:  Heartburn EXAM: CHEST  2 VIEW COMPARISON:  01/09/2012 FINDINGS: Normal heart size. Bilateral nipple shadows are noted. Lungs clear. No pneumothorax. No pleural effusion. IMPRESSION: No active cardiopulmonary disease. Electronically Signed   By: Marybelle Killings M.D.   On: 04/30/2016 07:23    Assessment & Plan:   Copelan was seen today for hyperlipidemia, annual exam and benign prostatic hypertrophy.  Diagnoses and all orders for this visit:  Hyperglycemia- his A1c is at 5.8%, he has very mild prediabetes, no medications are needed at this time. -     Comprehensive metabolic panel; Future -     Hemoglobin A1c; Future  Hyperlipidemia LDL goal <130- he has achieved his LDL goal is doing well on the statin. -     Lipid panel; Future  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 atrophy of thyroid- his TSH is in the normal range, he will remain on the current dose of levothyroxine. -     TSH; Future  Gastroesophageal reflux disease without esophagitis- he takes no medications for this and has no symptoms and no sign of complications or alarm features -     CBC with Differential/Platelet; Future  Benign prostatic hyperplasia without lower urinary tract symptoms- he is developing symptoms of obstructive uropathy, I've asked him to start to dutasteride to avoid future complications of BPH. -     PSA; Future -     Urinalysis, Routine w reflex microscopic (not at [facility]); Future -     dutasteride (AVODART) 0.5 MG capsule; Take 1 capsule (0.5 mg total) by mouth daily.  Elevated LFTs- his ALT and AST remain slightly elevated and his alkaline phosphatase is slightly elevated, I think this is most likely fatty liver disease and have asked him  to go undergo an ultrasound to confirm this -     US Abdomen Complete; Future   I have discontinued Mr. Guida typhoid, ciprofloxacin, and atovaquone-proguanil. I am also having him start on dutasteride. Additionally, I am having him maintain his aspirin EC, atorvastatin, levothyroxine, sertraline, and zaleplon.  Meds ordered this encounter  Medications  . dutasteride (AVODART) 0.5 MG capsule    Sig: Take 1 capsule (0.5 mg total) by mouth daily.    Dispense:  90 capsule    Refill:  3  See AVS for instructions about healthy living and anticipatory guidance.  Follow-up: Return in about 6 months (around 03/31/2017).  Scarlette Calico, MD

## 2016-10-02 DIAGNOSIS — R945 Abnormal results of liver function studies: Secondary | ICD-10-CM

## 2016-10-02 DIAGNOSIS — R7989 Other specified abnormal findings of blood chemistry: Secondary | ICD-10-CM | POA: Insufficient documentation

## 2016-10-02 DIAGNOSIS — Z0000 Encounter for general adult medical examination without abnormal findings: Secondary | ICD-10-CM | POA: Insufficient documentation

## 2016-10-02 NOTE — Assessment & Plan Note (Signed)

## 2016-10-14 ENCOUNTER — Ambulatory Visit (HOSPITAL_COMMUNITY)
Admission: RE | Admit: 2016-10-14 | Discharge: 2016-10-14 | Disposition: A | Discharge status: Home / Self Care | Payer: Medicare Other | Source: Ambulatory Visit | Attending: Internal Medicine | Admitting: Internal Medicine

## 2016-10-14 ENCOUNTER — Encounter: Payer: Self-pay | Admitting: Internal Medicine

## 2016-10-14 DIAGNOSIS — N281 Cyst of kidney, acquired: Secondary | ICD-10-CM | POA: Insufficient documentation

## 2016-10-14 DIAGNOSIS — R7989 Other specified abnormal findings of blood chemistry: Secondary | ICD-10-CM | POA: Insufficient documentation

## 2016-10-14 DIAGNOSIS — K7689 Other specified diseases of liver: Secondary | ICD-10-CM | POA: Diagnosis not present

## 2016-10-14 DIAGNOSIS — R945 Abnormal results of liver function studies: Secondary | ICD-10-CM | POA: Diagnosis not present

## 2016-10-22 DIAGNOSIS — H2513 Age-related nuclear cataract, bilateral: Secondary | ICD-10-CM | POA: Diagnosis not present

## 2016-10-22 DIAGNOSIS — H25043 Posterior subcapsular polar age-related cataract, bilateral: Secondary | ICD-10-CM | POA: Diagnosis not present

## 2016-10-22 DIAGNOSIS — H25013 Cortical age-related cataract, bilateral: Secondary | ICD-10-CM | POA: Diagnosis not present

## 2016-10-22 DIAGNOSIS — H2511 Age-related nuclear cataract, right eye: Secondary | ICD-10-CM | POA: Diagnosis not present

## 2016-10-22 DIAGNOSIS — H18413 Arcus senilis, bilateral: Secondary | ICD-10-CM | POA: Diagnosis not present

## 2016-11-13 ENCOUNTER — Ambulatory Visit (INDEPENDENT_AMBULATORY_CARE_PROVIDER_SITE_OTHER): Payer: Medicare Other

## 2016-11-13 DIAGNOSIS — M810 Age-related osteoporosis without current pathological fracture: Secondary | ICD-10-CM | POA: Diagnosis not present

## 2016-11-13 DIAGNOSIS — Z299 Encounter for prophylactic measures, unspecified: Secondary | ICD-10-CM

## 2016-11-13 DIAGNOSIS — Z23 Encounter for immunization: Secondary | ICD-10-CM

## 2016-11-13 MED ORDER — DENOSUMAB 60 MG/ML ~~LOC~~ SOLN
60.0000 mg | Freq: Once | SUBCUTANEOUS | Status: AC
  Administered 2016-11-13: 60 mg via SUBCUTANEOUS

## 2016-11-13 NOTE — Progress Notes (Signed)
Prolia was not given!!!

## 2016-12-13 ENCOUNTER — Telehealth: Payer: Self-pay

## 2016-12-13 ENCOUNTER — Other Ambulatory Visit: Payer: Self-pay | Admitting: Internal Medicine

## 2016-12-13 MED ORDER — LEVOTHYROXINE SODIUM 112 MCG PO TABS: 112.0000 ug | ORAL_TABLET | Freq: Every day | ORAL | 3 refills | Status: DC

## 2016-12-13 MED ORDER — OSELTAMIVIR PHOSPHATE 75 MG PO CAPS: 75.0000 mg | ORAL_CAPSULE | Freq: Two times a day (BID) | ORAL | 0 refills | Status: AC

## 2016-12-13 MED ORDER — ATORVASTATIN CALCIUM 10 MG PO TABS: 10.0000 mg | ORAL_TABLET | Freq: Every day | ORAL | 3 refills | Status: DC

## 2016-12-13 MED ORDER — SERTRALINE HCL 50 MG PO TABS
ORAL_TABLET | ORAL | 3 refills | Status: DC
Start: 2016-12-13 — End: 2017-12-24

## 2016-12-13 NOTE — Telephone Encounter (Signed)
Pt contacted and informed rx was sent in.   Also informed that mychart rq to fill maintenance meds for a year has been sent in by [facility].

## 2016-12-13 NOTE — Telephone Encounter (Signed)
done

## 2016-12-13 NOTE — Telephone Encounter (Signed)
Pt is leaving the country tomorrow.  Pt is rq a dose of tamiflu. Pt started having a sore throat yesterday and wanted to have treatment available in case it gets worse.   Please advise

## 2017-01-27 DIAGNOSIS — H2512 Age-related nuclear cataract, left eye: Secondary | ICD-10-CM | POA: Diagnosis not present

## 2017-01-28 DIAGNOSIS — H2511 Age-related nuclear cataract, right eye: Secondary | ICD-10-CM | POA: Diagnosis not present

## 2017-02-03 DIAGNOSIS — H2512 Age-related nuclear cataract, left eye: Secondary | ICD-10-CM | POA: Diagnosis not present

## 2017-02-06 LAB — LIPID PANEL
Cholesterol: 152 (ref 0–200)
HDL: 48 (ref 35–70)
LDL Cholesterol: 89
LDL/HDL RATIO: 3.2
TRIGLYCERIDES: 75 (ref 40–160)

## 2017-02-06 LAB — BASIC METABOLIC PANEL
BUN: 20 (ref 4–21)
CREATININE: 0.9 (ref 0.6–1.3)
Glucose: 101
POTASSIUM: 4 (ref 3.4–5.3)
Sodium: 139 (ref 137–147)

## 2017-02-06 LAB — CBC AND DIFFERENTIAL
HCT: 45 (ref 41–53)
Hemoglobin: 14.6 (ref 13.5–17.5)
PLATELETS: 192 (ref 150–399)
WBC: 8.1

## 2017-02-06 LAB — PSA: PSA: 0.51

## 2017-02-06 LAB — HEPATIC FUNCTION PANEL
ALK PHOS: 140 — AB (ref 25–125)
ALT: 53 — AB (ref 10–40)
AST: 49 — AB (ref 14–40)
BILIRUBIN, TOTAL: 0.7

## 2017-02-06 LAB — TSH: TSH: 2.99 (ref 0.41–5.90)

## 2017-02-20 DIAGNOSIS — K146 Glossodynia: Secondary | ICD-10-CM | POA: Diagnosis not present

## 2017-03-17 DIAGNOSIS — H2511 Age-related nuclear cataract, right eye: Secondary | ICD-10-CM | POA: Diagnosis not present

## 2017-03-24 DIAGNOSIS — H2511 Age-related nuclear cataract, right eye: Secondary | ICD-10-CM | POA: Diagnosis not present

## 2017-04-08 ENCOUNTER — Ambulatory Visit: Payer: Medicare Other | Admitting: Sports Medicine

## 2017-04-08 ENCOUNTER — Ambulatory Visit: Payer: Medicare Other | Admitting: Internal Medicine

## 2017-04-15 ENCOUNTER — Ambulatory Visit: Payer: Self-pay

## 2017-04-15 ENCOUNTER — Ambulatory Visit (INDEPENDENT_AMBULATORY_CARE_PROVIDER_SITE_OTHER): Payer: Medicare Other | Admitting: Sports Medicine

## 2017-04-15 ENCOUNTER — Encounter: Payer: Self-pay | Admitting: Sports Medicine

## 2017-04-15 VITALS — BP 114/70 | Ht 68.0 in | Wt 155.0 lb

## 2017-04-15 DIAGNOSIS — M25531 Pain in right wrist: Secondary | ICD-10-CM

## 2017-04-15 NOTE — Assessment & Plan Note (Signed)
Ultrasound suggested an injury to compartment 6 with a small avulsion  This should heal particularly if he protects it from excess motion  Use a wrist compression sleeve  Recheck if not resolving

## 2017-04-15 NOTE — Progress Notes (Signed)
CC: R. wrist pain  HPI:  William Luna is a 66 yo male presenting with right wrist pain. About two months ago (on 02/23/17) he tripped over a curb and tried to break his fall by outstretching both his arms. Since that day, he has been experiencing pain on ulnar side of his right wrist. He initially had pain upon dorsiflexion against any object which has improved since his injury. He also had pain during radial deviation of his right wrist specifically while driving a car.   ROS: Denies swelling, bruising, numbness, or tingling. No limitation of activity however he has avoided playing tennis since the injury. He has not taken any medication for it and has not tried wearing a brace of any type. Otherwise  Negative except per HPI  PSHx - No surgeries in upper extremities  PE: General - Well Appearing, alert and NAD Upper extremities: Right - Normal strength (5/5), normal wrist ROM, sensation intact, no swelling or deformities, tender to palpation near ulnar styloid process Left - Normal strength, normal ROM, sensation intact, no swelling or deformities.  Ultrasound of right wrist -  Ultrasound of the dorsal wrist was completed Compartments 1 through 5 show normal appearance with no excessive hypoechoic change within the tendon sheaths In compartments 6 there is a slight tearing of extensor carpi ulnaris At the attachment of the retinaculum to the superior ulna there is a small free fragment that is hyperechoic  Interpretation - he ultrasound is consistent with a partial avulsion of a small bony fragment from the retinaculum over the extensor carpi ulnaris tendon. There is minor tendon injury observed as well.  Ultrasound and interpretation by William Luna. William Moroney, MD   A/P: William Luna is a 66 y/o male presenting with right wrist pain after falling on outstretched arms. The symptoms and U/S are consistent with a tear of extensor carpi ulnaris tendon. He will need to wear a full wrist  Compression wrap for  three months while performing extensive activities.  Extensor Carpi Ulnaris tendon tear -Full wrist compressio wrap while active or playing tennis -F/u as needed

## 2017-06-06 ENCOUNTER — Other Ambulatory Visit: Payer: Self-pay | Admitting: Internal Medicine

## 2017-06-06 DIAGNOSIS — F5105 Insomnia due to other mental disorder: Principal | ICD-10-CM

## 2017-06-06 DIAGNOSIS — F409 Phobic anxiety disorder, unspecified: Secondary | ICD-10-CM

## 2017-06-09 NOTE — Telephone Encounter (Signed)
Faxed script back to [facility].../lmb 

## 2017-06-19 ENCOUNTER — Ambulatory Visit (INDEPENDENT_AMBULATORY_CARE_PROVIDER_SITE_OTHER): Payer: Medicare Other | Admitting: Sports Medicine

## 2017-06-19 ENCOUNTER — Encounter: Payer: Self-pay | Admitting: Sports Medicine

## 2017-06-19 DIAGNOSIS — M25531 Pain in right wrist: Secondary | ICD-10-CM

## 2017-06-19 NOTE — Assessment & Plan Note (Signed)
ECU injury appears to have nearly resolved  Scan is improved  Cont. With conservative care  Recheck if sxs recur

## 2017-06-19 NOTE — Progress Notes (Signed)
  William Luna - 66 y.o. male MRN [id number]  Date of birth: Oct 22, 1951  SUBJECTIVE:  Including CC & ROS.  CC: Right wrist f/u and Right arm pain  Patient reports that right wrist pain has improved. Previously he was seen on 04/15/17 for partial ECU tear with avulsion. He has been wearing wrist brace for heavy activity and not returned to tennis. He describes that wrist is allowing him to do all activities when he is wearing the brace.   Patient has noticed that right posterior arm has had some pain with external rotation at the shoulder. Describes this a sharp type pain that is never present at rest. One example of pain is when he was opening a sliding door. Also has some pain with sleeping on this side at night. No pain with overhead activity and no pain in shoulder joint itself.    HISTORY: Past Medical, Surgical, Social, and Family History Reviewed & Updated per EMR.   Pertinent Historical Findings include: Right ECU abnormality Cervical fusion  DATA REVIEWED: Previous US 04/15/17  Cmt 6 with a partial avulsion of a small bony fragment from the retinaculum over the extensor carpi ulnaris tendon. There is minor tendon injury observed as well.  ROS No radicular sxs into wrist or arm No swelling in wrist  PHYSICAL EXAM:  VS: BP:120/70  HR: bpm  TEMP: ( )  RESP:   HT:5\' 8"  (172.7 cm)   WT:152 lb (68.9 kg)  BMI:23.2 PHYSICAL EXAM: B/l Shoulder: Inspection reveals no abnormalities, atrophy or asymmetry. Palpation is normal with no tenderness over AC joint or bicipital groove. ROM is full in all planes. Pain in posterior R arm with resisted ext rotation.  Rotator cuff strength normal throughout. No signs of impingement with negative Neer and Hawkin's tests, empty can sign. Speeds and Yergason's tests normal. No labral pathology noted with negative Obrien's, negative clunk and good stability. Scapular protraction noted on the right side compared to the left.  No painful arc and no  drop arm sign.  Right Wrist Inspection with no abnormalities, atrophy or asymmetry. Palpation with mild TTP to lateral aspect over ulnar styloid ROM full in all planes. Mild pain to resisted extension and inversion.   Ultrasound of Right wrist   Ultrasound of the dorsal wrist was completed Compartments 1 through 5 show normal appearance with no excessive hypoechoic change within the tendon sheaths In compartments 6 there is improvement with no hypoechoic fluid surrounding extensor carpi ulnaris small free fragment appears nearly resorbed  Ultrasound of right shoulder BT short-intact BT long-intact Supraspinatus tendon-intact Subscapularis tendon-intact Infraspinatus tendon-intact Teres Minor tendon-intact  Impression : normal rotator cuff ultrasound and improved lateral wrist with resolution of swelling around ECU tendon.  Ultrasound and interpretation by Wolfgang Phoenix. [place] Rabenold, MD    ASSESSMENT & PLAN: See problem based charting & AVS for pt instructions. Improvement in R wrist pain  Right posterior arm pain  Patient with improvement in R wrist pain and improvement on [place]. Advised that he can continue activity and use brace as needed for support. Right posterior arm pain likely result of scapular protraction and nerve irritation on right in area of infraspinatus tendon. Patient given HEP to help to keep scapula retracted on the right. Patient will return to care as needed for non-improvement or worsening symptoms.

## 2017-07-04 DIAGNOSIS — N401 Enlarged prostate with lower urinary tract symptoms: Secondary | ICD-10-CM | POA: Diagnosis not present

## 2017-07-04 DIAGNOSIS — N138 Other obstructive and reflux uropathy: Secondary | ICD-10-CM | POA: Diagnosis not present

## 2017-07-04 DIAGNOSIS — N281 Cyst of kidney, acquired: Secondary | ICD-10-CM | POA: Diagnosis not present

## 2017-07-04 DIAGNOSIS — F524 Premature ejaculation: Secondary | ICD-10-CM | POA: Diagnosis not present

## 2017-07-04 DIAGNOSIS — N528 Other male erectile dysfunction: Secondary | ICD-10-CM | POA: Diagnosis not present

## 2017-07-31 ENCOUNTER — Ambulatory Visit: Payer: Medicare Other | Admitting: Sports Medicine

## 2017-12-01 ENCOUNTER — Other Ambulatory Visit: Payer: Self-pay | Admitting: Internal Medicine

## 2017-12-01 DIAGNOSIS — N281 Cyst of kidney, acquired: Secondary | ICD-10-CM | POA: Diagnosis not present

## 2017-12-01 DIAGNOSIS — N138 Other obstructive and reflux uropathy: Secondary | ICD-10-CM | POA: Diagnosis not present

## 2017-12-01 DIAGNOSIS — F409 Phobic anxiety disorder, unspecified: Secondary | ICD-10-CM

## 2017-12-01 DIAGNOSIS — F524 Premature ejaculation: Secondary | ICD-10-CM | POA: Diagnosis not present

## 2017-12-01 DIAGNOSIS — N401 Enlarged prostate with lower urinary tract symptoms: Secondary | ICD-10-CM | POA: Diagnosis not present

## 2017-12-01 DIAGNOSIS — F5105 Insomnia due to other mental disorder: Principal | ICD-10-CM

## 2017-12-01 DIAGNOSIS — N528 Other male erectile dysfunction: Secondary | ICD-10-CM | POA: Diagnosis not present

## 2017-12-01 MED ORDER — ZALEPLON 10 MG PO CAPS: 10.0000 mg | ORAL_CAPSULE | Freq: Every evening | ORAL | 1 refills | Status: DC | PRN

## 2017-12-22 ENCOUNTER — Other Ambulatory Visit: Payer: Self-pay | Admitting: Internal Medicine

## 2017-12-24 ENCOUNTER — Encounter: Payer: Self-pay | Admitting: Internal Medicine

## 2017-12-24 MED ORDER — ATORVASTATIN CALCIUM 10 MG PO TABS: 10.0000 mg | ORAL_TABLET | Freq: Every day | ORAL | 0 refills | Status: DC

## 2017-12-24 MED ORDER — LEVOTHYROXINE SODIUM 112 MCG PO TABS: 112.0000 ug | ORAL_TABLET | Freq: Every day | ORAL | 0 refills | Status: DC

## 2017-12-24 MED ORDER — SERTRALINE HCL 50 MG PO TABS: ORAL_TABLET | ORAL | 0 refills | Status: DC

## 2018-01-06 ENCOUNTER — Encounter: Payer: Self-pay | Admitting: Internal Medicine

## 2018-01-06 ENCOUNTER — Ambulatory Visit (INDEPENDENT_AMBULATORY_CARE_PROVIDER_SITE_OTHER): Payer: Medicare Other | Admitting: *Deleted

## 2018-01-06 ENCOUNTER — Ambulatory Visit (INDEPENDENT_AMBULATORY_CARE_PROVIDER_SITE_OTHER): Payer: Medicare Other | Admitting: Internal Medicine

## 2018-01-06 VITALS — BP 120/60 | HR 69 | Temp 98.7°F | Ht 68.0 in | Wt 152.0 lb

## 2018-01-06 DIAGNOSIS — E785 Hyperlipidemia, unspecified: Secondary | ICD-10-CM

## 2018-01-06 DIAGNOSIS — F5105 Insomnia due to other mental disorder: Secondary | ICD-10-CM

## 2018-01-06 DIAGNOSIS — Z0000 Encounter for general adult medical examination without abnormal findings: Secondary | ICD-10-CM

## 2018-01-06 DIAGNOSIS — E034 Atrophy of thyroid (acquired): Secondary | ICD-10-CM

## 2018-01-06 DIAGNOSIS — R4681 Obsessive-compulsive behavior: Secondary | ICD-10-CM

## 2018-01-06 DIAGNOSIS — F409 Phobic anxiety disorder, unspecified: Secondary | ICD-10-CM

## 2018-01-06 DIAGNOSIS — R739 Hyperglycemia, unspecified: Secondary | ICD-10-CM | POA: Diagnosis not present

## 2018-01-06 DIAGNOSIS — N400 Enlarged prostate without lower urinary tract symptoms: Secondary | ICD-10-CM | POA: Diagnosis not present

## 2018-01-06 DIAGNOSIS — Z23 Encounter for immunization: Secondary | ICD-10-CM | POA: Diagnosis not present

## 2018-01-06 DIAGNOSIS — K2100 Gastro-esophageal reflux disease with esophagitis, without bleeding: Secondary | ICD-10-CM

## 2018-01-06 MED ORDER — ATORVASTATIN CALCIUM 10 MG PO TABS: 10.0000 mg | ORAL_TABLET | Freq: Every day | ORAL | 1 refills | Status: DC

## 2018-01-06 MED ORDER — SERTRALINE HCL 50 MG PO TABS: ORAL_TABLET | ORAL | 1 refills | Status: DC

## 2018-01-06 MED ORDER — ZALEPLON 10 MG PO CAPS: 10.0000 mg | ORAL_CAPSULE | Freq: Every evening | ORAL | 1 refills | Status: DC | PRN

## 2018-01-06 MED ORDER — LEVOTHYROXINE SODIUM 112 MCG PO TABS: 112.0000 ug | ORAL_TABLET | Freq: Every day | ORAL | 1 refills | Status: DC

## 2018-01-06 NOTE — Progress Notes (Addendum)
Subjective:   William Luna is a 67 y.o. male who presents for Medicare Annual/Subsequent preventive examination.  Review of Systems:  No ROS.  Medicare Wellness Visit. Additional risk factors are reflected in the social history.  Cardiac Risk Factors include: advanced age (>66men, >81 women);male gender;dyslipidemia Sleep patterns: feels rested on waking, gets up 1 times nightly to void and sleeps 8 hours nightly.   Home Safety/Smoke Alarms: Feels safe in home. Smoke alarms in place.  Living environment; residence and Firearm Safety: 2-story house, no firearms. Lives with wife, no needs for DME, good support system Seat Belt Safety/Bike Helmet: Wears seat belt.   Male:  PSA-  Lab Results  Component Value Date   PSA 0.53 10/01/2016   PSA 0.56 03/21/2016   PSA 0.52 02/16/2015       Objective:    Vitals: There were no vitals taken for this visit.  There is no height or weight on file to calculate BMI.  Advanced Directives 01/06/2018 06/19/2017 10/02/2016 07/04/2016 04/30/2016 10/09/2015 03/02/2015  Does Patient Have a Medical Advance Directive? Yes No Yes Yes Yes Yes Yes  Type of Paramedic of [place];Living will - [place];Living will Living will;Healthcare Power of [place];Living will [place];Living will [place];Living will  Does patient want to make changes to medical advance directive? - - No - Patient declined - - - -  Copy of [place] in Chart? No - copy requested - Yes - - No - copy requested -  Pre-existing out of facility DNR order (yellow form or pink MOST form) - - - - - - -    Tobacco Social History   Tobacco Use  Smoking Status Never Smoker  Smokeless Tobacco Never Used     Counseling given: Not Answered  Past Medical History:  Diagnosis Date  . ANEMIA, IRON DEFICIENCY   . CERVICAL RADICULOPATHY    disectomy in 1990s  . DDD  (degenerative disc disease), lumbar oct '12   L4-5, ESI therapy successful 10/12; L5-S1 radiculopathy 3/13  . GERD    EGD negative x 3, off medication (July '13)  . HYPERLIPIDEMIA, MILD    lipitor 10mg    . Hypothyroidism   . 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 TRANSAMINASES, SERUM, ELEVATED    Past Surgical History:  Procedure Laterality Date  . BACK SURGERY  '90s ([place])   CERVICAL DISC - diskectomy w/ fusion, anterior  . HEMIGLOSSECTOMY  10/30/2011   Procedure: HEMIGLOSSECTOMY;  Surgeon: Cecil Cranker;  Location: [facility] OR;  Service: ENT;  Laterality: Right;  Right tongue resection  . LUMBAR LAMINECTOMY/DECOMPRESSION MICRODISCECTOMY  01/29/2012   Procedure: LUMBAR LAMINECTOMY/DECOMPRESSION MICRODISCECTOMY;  Surgeon: Kristeen Miss, MD;  Location: [place] [facility]UR[facility] ORS;  Service: Neurosurgery;  Laterality: Left;  Left L5-S1 Microdiskectomy  . LUMBAR LAMINECTOMY/DECOMPRESSION MICRODISCECTOMY  07/13/2012   Procedure: LUMBAR LAMINECTOMY/DECOMPRESSION MICRODISCECTOMY 1 LEVEL;  Surgeon: Kristeen Miss, MD;  Location: [facility];  Service: Neurosurgery;  Laterality: Left;  Redo Left Lumbar five-sacral one Microdiskectomy  . nail avulsion     right first finger due to a wart  . RADICAL NECK DISSECTION  10/30/2011   Procedure: RADICAL NECK DISSECTION;  Surgeon: Cecil Cranker;  Location: [facility] OR;  Service: ENT;  Laterality: Right;   Family History  Problem Relation Age of Onset  .  Diabetes Mother   . Anemia Mother   . Cirrhosis Mother 98       primary biliary cirrhosis  . Hyperlipidemia Father   . Hypertension Father   . Heart disease Father   . Colon cancer Maternal Grandfather   . COPD Neg Hx    Social History   Socioeconomic History  . Marital status: Married    Spouse name: William Luna  . Number of children: 2  . Years of education: 81  . Highest education level: None  Social Needs  .  Financial resource strain: Not hard at all  . Food insecurity - worry: Never true  . Food insecurity - inability: Never true  . Transportation needs - medical: No  . Transportation needs - non-medical: No  Occupational History  . Occupation: Secretary/administrator: [employer]  Tobacco Use  . Smoking status: Never Smoker  . Smokeless tobacco: Never Used  Substance and Sexual Activity  . Alcohol use: Yes    Alcohol/week: 0.0 oz    Comment: OCC.  . Drug use: No  . Sexual activity: Yes    Partners: Female    Birth control/protection: None  Other Topics Concern  . None  Social History Narrative   [employer] [ [place];; scholar}; [place],   [employer] residency/fellowship.    Married- 1977 - 25 years/divorced;  Married 2006.    1 son  b1980, 1 daughter - [phone]; Pearson Grippe [phone],  Step-daughter [phone].    Grandchildren - 2 (b'2010, 2013).    Work - Oncologist. Retired 2016   ACP/Living will:  Wife  with healthcare POA; Yes - CPR, Yes - short-term mechanical ventilation,  no futile care.    Outpatient Encounter Medications as of 01/06/2018  Medication Sig  . aspirin EC 81 MG tablet Take 81 mg by mouth. Patient takes three times a week. Monday, Wednesday and Saturday.  Marland Kitchen aspirin EC 81 MG tablet Take by mouth.  Marland Kitchen atorvastatin (LIPITOR) 10 MG tablet Take 1 tablet (10 mg total) by mouth daily.  Marland Kitchen levothyroxine (SYNTHROID, LEVOTHROID) 112 MCG tablet Take 1 tablet (112 mcg total) by mouth daily.  . sertraline (ZOLOFT) 50 MG tablet 50 mg (1 tab) by mouth every other day alternating with 75 mg (1.5 tabs) by mouth every other day  . triamcinolone (KENALOG) 0.1 % paste APPLY TO AFFECTED AREA UP TO 6 TIMES A DAY  . zaleplon (SONATA) 10 MG capsule Take 1 capsule (10 mg total) by mouth at bedtime as needed for sleep.  . [DISCONTINUED] atorvastatin (LIPITOR) 10 MG tablet Take 1 tablet (10 mg total) by mouth daily.  . [DISCONTINUED] dutasteride (AVODART) 0.5 MG capsule Take 1  capsule (0.5 mg total) by mouth daily.  . [DISCONTINUED] levothyroxine (SYNTHROID, LEVOTHROID) 112 MCG tablet Take 1 tablet (112 mcg total) by mouth daily.  . [DISCONTINUED] sertraline (ZOLOFT) 50 MG tablet 50 mg (1 tab) by mouth every other day alternating with 75 mg (1.5 tabs) by mouth every other day  . [DISCONTINUED] zaleplon (SONATA) 10 MG capsule Take 1 capsule (10 mg total) by mouth at bedtime as needed for sleep.   No facility-administered encounter medications on file as of 01/06/2018.     Activities of Daily Living In your present state of health, do you have any difficulty performing the following activities: 01/06/2018  Hearing? N  Vision? N  Difficulty concentrating or making decisions? N  Walking or climbing stairs? N  Dressing or bathing? N  Doing errands, shopping?  N  Preparing Food and eating ? N  Using the Toilet? N  In the past six months, have you accidently leaked urine? N  Do you have problems with loss of bowel control? N  Managing your Medications? N  Managing your Finances? N  Housekeeping or managing your Housekeeping? N  Some recent data might be hidden    Patient Care Team: Janith Lima, MD as PCP - General (Internal Medicine) Lavonna Monarch, MD (Dermatology) Nobie Putnam, MD (Hematology and Oncology) Kristeen Miss, MD (Neurosurgery) Melissa Montane, MD (Otolaryngology) Vicie Mutters, MD Lafayette Dragon, MD (Inactive) (Gastroenterology)   Assessment:   This is a routine wellness examination for Naszir. Physical assessment deferred to PCP.   Exercise Activities and Dietary recommendations Current Exercise Habits: Home exercise routine;Structured exercise class, Type of exercise: walking;calisthenics;strength training/weights, Time (Minutes): 50, Frequency (Times/Week): 5, Weekly Exercise (Minutes/Week): 250, Exercise limited by: None identified  Diet (meal preparation, eat out, water intake, caffeinated beverages, dairy products, fruits and vegetables): in  general, a "healthy" diet  , well balanced, eats a variety of fruits and vegetables daily, limits salt, fat/cholesterol, sugar,carbohydrates,caffeine, encouraged patient to increase daily water intake.   Goals    . Patient Stated     I would like to eat healthier by planning my meals better at home and monitoring my carbohydrate intake.  Continue to exercise routinely, enjoy doing my home work projects. Continue to travel and ski.        Fall Risk Fall Risk  01/06/2018 06/19/2017 10/02/2016 08/01/2016 07/04/2016  Falls in the past year? No No No No No  Risk for fall due to : - Other (Comment) - - -    Depression Screen PHQ 2/9 Scores 01/06/2018 10/02/2016 08/01/2016 07/04/2016  PHQ - 2 Score 2 0 0 0  PHQ- 9 Score 2 - - -    Cognitive Function MMSE - Mini Mental State Exam 01/06/2018  Not completed: Refused       Ad8 score reviewed for issues:  Issues making decisions: no  Less interest in hobbies / activities: no  Repeats questions, stories (family complaining): no  Trouble using ordinary gadgets (microwave, computer, phone):no  Forgets the month or year: no  Mismanaging finances: no  Remembering appts: no  Daily problems with thinking and/or memory: no Ad8 score is= 0  Immunization History  Administered Date(s) Administered  . Hepatitis A 11/13/2016  . Hepatitis A, Adult 04/19/2016  . Hepatitis B 04/10/1989, 05/14/1989, 10/22/1989  . Influenza,inj,Quad PF,6+ Mos 08/26/2011, 08/26/2012, 08/05/2013, 08/04/2014, 07/19/2017  . Influenza-Unspecified 08/26/2011, 08/19/2015, 08/15/2016  . PPD Test 02/19/2005, 03/04/2006, 03/05/2007, 04/15/2008, 02/20/2009, 05/03/2010, 05/07/2010  . Pneumococcal Conjugate-13 10/09/2015, 11/21/2015  . Pneumococcal Polysaccharide-23 01/06/2018  . Td 11/18/1998  . Tdap 07/24/2012  . Typhoid Live 04/19/2016  . Zoster 10/20/2012   Screening Tests Health Maintenance  Topic Date Due  . COLONOSCOPY  11/30/2018  . TETANUS/TDAP  07/24/2022  .  INFLUENZA VACCINE  Completed  . Hepatitis C Screening  Completed  . PNA vac Low Risk Adult  Completed      Plan:     Continue doing brain stimulating activities (puzzles, reading, adult coloring books, staying active) to keep memory sharp.   Continue to eat heart healthy diet (full of fruits, vegetables, whole grains, lean protein, water--limit salt, fat, and sugar intake) and increase physical activity as tolerated.  I have personally reviewed and noted the following in the patient's chart:   . Medical and social history . Use of  alcohol, tobacco or illicit drugs  . Current medications and supplements . Functional ability and status . Nutritional status . Physical activity . Advanced directives . List of other physicians . Vitals . Screenings to include cognitive, depression, and falls . Referrals and appointments  In addition, I have reviewed and discussed with patient certain preventive protocols, quality metrics, and best practice recommendations. A written personalized care plan for preventive services as well as general preventive health recommendations were provided to patient.   Medical screening examination/treatment/procedure(s) were performed by non-physician practitioner and as supervising physician I was immediately available for consultation/collaboration. I agree with above. Scarlette Calico, MD   Michiel Cowboy, RN  01/06/2018

## 2018-01-06 NOTE — Patient Instructions (Signed)
Continue doing brain stimulating activities (puzzles, reading, adult coloring books, staying active) to keep memory sharp.   Continue to eat heart healthy diet (full of fruits, vegetables, whole grains, lean protein, water--limit salt, fat, and sugar intake) and increase physical activity as tolerated.   William Luna , Thank you for taking time to come for your Medicare Wellness Visit. I appreciate your ongoing commitment to your health goals. Please review the following plan we discussed and let me know if I can assist you in the future.   These are the goals we discussed: Goals    . Patient Stated     I would like to eat healthier by planning my meals better at home and monitoring my carbohydrate intake.  Continue to exercise routinely, enjoy doing my home work projects. Continue to travel and ski.        This is a list of the screening recommended for you and due dates:  Health Maintenance  Topic Date Due  . Colon Cancer Screening  11/30/2018  . Tetanus Vaccine  07/24/2022  . Flu Shot  Completed  .  Hepatitis C: One time screening is recommended by Center for Disease Control  (CDC) for  adults born from [birth year] through 1967.   Completed  . Pneumonia vaccines  Completed

## 2018-01-06 NOTE — Patient Instructions (Signed)
Hypothyroidism Hypothyroidism is a disorder of the thyroid. The thyroid is a large gland that is located in the lower front of the neck. The thyroid releases hormones that control how the body works. With hypothyroidism, the thyroid does not make enough of these hormones. What are the causes? Causes of hypothyroidism may include:  Viral infections.  Pregnancy.  Your own defense system (immune system) attacking your thyroid.  Certain medicines.  Birth defects.  Past radiation treatments to your head or neck.  Past treatment with radioactive iodine.  Past surgical removal of part or all of your thyroid.  Problems with the gland that is located in the center of your brain (pituitary).  What are the signs or symptoms? Signs and symptoms of hypothyroidism may include:  Feeling as though you have no energy (lethargy).  Inability to tolerate cold.  Weight gain that is not explained by a change in diet or exercise habits.  Dry skin.  Coarse hair.  Menstrual irregularity.  Slowing of thought processes.  Constipation.  Sadness or depression.  How is this diagnosed? Your health care provider may diagnose hypothyroidism with blood tests and ultrasound tests. How is this treated? Hypothyroidism is treated with medicine that replaces the hormones that your body does not make. After you begin treatment, it may take several weeks for symptoms to go away. Follow these instructions at home:  Take medicines only as directed by your health care provider.  If you start taking any new medicines, tell your health care provider.  Keep all follow-up visits as directed by your health care provider. This is important. As your condition improves, your dosage needs may change. You will need to have blood tests regularly so that your health care provider can watch your condition. Contact a health care provider if:  Your symptoms do not get better with treatment.  You are taking thyroid  replacement medicine and: ? You sweat excessively. ? You have tremors. ? You feel anxious. ? You lose weight rapidly. ? You cannot tolerate heat. ? You have emotional swings. ? You have diarrhea. ? You feel weak. Get help right away if:  You develop chest pain.  You develop an irregular heartbeat.  You develop a rapid heartbeat. This information is not intended to replace advice given to you by your health care provider. Make sure you discuss any questions you have with your health care provider. Document Released: 11/04/2005 Document Revised: 04/11/2016 Document Reviewed: 03/22/2014 Elsevier Interactive Patient Education  2018 Elsevier Inc.  

## 2018-01-06 NOTE — Progress Notes (Signed)
Subjective:  Patient ID: William Luna, male    DOB: August 18, 1951  Age: 67 y.o. MRN: [id number]  CC: Hyperlipidemia and Hypothyroidism   HPI William Luna presents for f/up - He tells me that he is followed closely by his urologist Lawerance Bach) regarding BPH and a renal cyst.  He is not bothered by BPH symptoms so he decided to stop taking Avodart.  He feels like his thyroid dose is adequate as he has had no recent episodes of fatigue, weight gain, palpitations, edema, or fatigue.  He does suffer from chronic insomnia and wants a refill on Sonata.  Outpatient Medications Prior to Visit  Medication Sig Dispense Refill  . aspirin EC 81 MG tablet Take 81 mg by mouth. Patient takes three times a week. Monday, Wednesday and Saturday.    Marland Kitchen aspirin EC 81 MG tablet Take by mouth.    Marland Kitchen atorvastatin (LIPITOR) 10 MG tablet Take 1 tablet (10 mg total) by mouth daily. 90 tablet 0  . levothyroxine (SYNTHROID, LEVOTHROID) 112 MCG tablet Take 1 tablet (112 mcg total) by mouth daily. 90 tablet 0  . sertraline (ZOLOFT) 50 MG tablet 50 mg (1 tab) by mouth every other day alternating with 75 mg (1.5 tabs) by mouth every other day 135 tablet 0  . zaleplon (SONATA) 10 MG capsule Take 1 capsule (10 mg total) by mouth at bedtime as needed for sleep. 30 capsule 1  . triamcinolone (KENALOG) 0.1 % paste APPLY TO AFFECTED AREA UP TO 6 TIMES A DAY  0  . dutasteride (AVODART) 0.5 MG capsule Take 1 capsule (0.5 mg total) by mouth daily. 90 capsule 3   No facility-administered medications prior to visit.     ROS Review of Systems  Constitutional: Negative for appetite change, diaphoresis, fatigue and unexpected weight change.  HENT: Negative.  Negative for trouble swallowing.   Eyes: Negative.  Negative for visual disturbance.  Respiratory: Negative for cough, chest tightness, shortness of breath and wheezing.   Cardiovascular: Negative for chest pain, palpitations and leg swelling.  Gastrointestinal: Negative for  abdominal pain, constipation, diarrhea, nausea and vomiting.  Endocrine: Negative.  Negative for cold intolerance and heat intolerance.  Genitourinary: Negative for difficulty urinating.  Musculoskeletal: Negative.  Negative for arthralgias, myalgias and neck pain.  Skin: Negative.  Negative for color change, pallor and rash.  Allergic/Immunologic: Negative.   Neurological: Negative.  Negative for dizziness, weakness and light-headedness.  Hematological: Negative for adenopathy. Does not bruise/bleed easily.  Psychiatric/Behavioral: Positive for sleep disturbance. Negative for confusion, decreased concentration, dysphoric mood and suicidal ideas. The patient is not nervous/anxious.     Objective:  BP 120/60 (BP Location: Right Arm, Patient Position: Sitting, Cuff Size: Normal)   Pulse 69   Temp 98.7 F (37.1 C) (Oral)   Ht 5\' 8"  (1.727 m)   Wt 152 lb 0.2 oz (69 kg)   SpO2 99%   BMI 23.11 kg/m   BP Readings from Last 3 Encounters:  01/06/18 120/60  06/19/17 120/70  04/15/17 114/70    Wt Readings from Last 3 Encounters:  01/06/18 152 lb 0.2 oz (69 kg)  06/19/17 152 lb (68.9 kg)  04/15/17 155 lb (70.3 kg)    Physical Exam  Constitutional: He is oriented to person, place, and time. No distress.  HENT:  Mouth/Throat: Oropharynx is clear and moist. No oropharyngeal exudate.  Eyes: Conjunctivae are normal. Left eye exhibits no discharge. No scleral icterus.  Neck: Normal range of motion. Neck supple. No JVD  present. No thyromegaly present.  Cardiovascular: Normal rate, regular rhythm and normal heart sounds. Exam reveals no gallop.  No murmur heard. Pulmonary/Chest: Effort normal and breath sounds normal. No respiratory distress. He has no wheezes. He has no rales.  Abdominal: Bowel sounds are normal. He exhibits no distension and no mass. There is no tenderness. There is no guarding.  Musculoskeletal: Normal range of motion. He exhibits no edema, tenderness or deformity.    Lymphadenopathy:    He has no cervical adenopathy.  Neurological: He is alert and oriented to person, place, and time.  Skin: Skin is warm and dry. No rash noted. He is not diaphoretic. No erythema. No pallor.  Psychiatric: He has a normal mood and affect. His behavior is normal. Judgment and thought content normal.  Vitals reviewed.   Lab Results  Component Value Date   WBC 8.1 02/06/2017   HGB 14.6 02/06/2017   HCT 45 02/06/2017   PLT 192 02/06/2017   GLUCOSE 98 10/01/2016   CHOL 152 02/06/2017   TRIG 75 02/06/2017   HDL 48 02/06/2017   LDLDIRECT 134.5 12/15/2006   LDLCALC 89 02/06/2017   ALT 53 (A) 02/06/2017   AST 49 (A) 02/06/2017   NA 139 02/06/2017   K 4.0 02/06/2017   CL 103 10/01/2016   CREATININE 0.9 02/06/2017   BUN 20 02/06/2017   CO2 33 (H) 10/01/2016   TSH 2.99 02/06/2017   PSA 0.51 02/06/2017   HGBA1C 5.8 10/01/2016    US Abdomen Complete  Result Date: 10/14/2016 CLINICAL DATA:  Increased LFTs. EXAM: ABDOMEN ULTRASOUND COMPLETE COMPARISON:  None. FINDINGS: Gallbladder: No gallstones or wall thickening visualized. No sonographic Murphy sign noted by sonographer. Gallbladder wall thickness is within normal limits at 2.0 mm. Common bile duct: Diameter: 3.1 mm, within normal limits Liver: Liver parenchyma is within normal limits. A benign appearing 1.2 cm cyst is present in the right lobe of the liver. No other focal lesions are present. IVC: No abnormality visualized. Pancreas: Visualized portion unremarkable. Spleen: Size and appearance within normal limits. Right Kidney: Length: 12.3 cm, within normal limits. Echogenicity within normal limits. No mass or hydronephrosis visualized. Left Kidney: Length: 12.7 cm, within normal limits. Echogenicity within normal limits. A 2.8 cm benign-appearing cyst is noted. No other focal lesions are evident. No hydronephrosis visualized. Abdominal aorta: No aneurysm visualized. Other findings: None. IMPRESSION: 1. Benign appearing  1.2 cm cysts in the liver. 2. No other focal hepatic lesions. 3. Normal appearance of the gallbladder. 4. 2.8 cm simple cyst in the left kidney. Electronically Signed   By: San Morelle M.D.   On: 10/14/2016 14:45    Assessment & Plan:   William Luna was seen today for hyperlipidemia and hypothyroidism.  Diagnoses and all orders for this visit:  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 atrophy of thyroid-I will recheck his TSH and will adjust his levothyroxine dose if indicated. -     TSH; Future -     levothyroxine (SYNTHROID, LEVOTHROID) 112 MCG tablet; Take 1 tablet (112 mcg total) by mouth daily.  Hyperlipidemia LDL goal <130-I will monitor his lipids to see that he is achieved his LDL goal.  He is doing well on the statin. -     Comprehensive metabolic panel; Future -     Lipid panel; Future -     atorvastatin (LIPITOR) 10 MG tablet; Take 1 tablet (10 mg total) by mouth daily.  Routine general medical examination at a health care facility  Hyperglycemia- He is mildly prediabetic.  I will check his A1c to see if his blood sugars have risen to the level that needs to be treated with a medication. -     Hemoglobin A1c; Future  Gastroesophageal reflux disease with esophagitis - His symptoms are adequately well controlled with no medication. -     CBC with Differential/Platelet; Future  Benign prostatic hyperplasia without lower urinary tract symptoms -     PSA; Future  Insomnia due to anxiety and fear -     zaleplon (SONATA) 10 MG capsule; Take 1 capsule (10 mg total) by mouth at bedtime as needed for sleep.  Obsessive behaviors -     sertraline (ZOLOFT) 50 MG tablet; 50 mg (1 tab) by mouth every other day alternating with 75 mg (1.5 tabs) by mouth every other day  Need for pneumococcal vaccination -     Pneumococcal polysaccharide vaccine 23-valent greater than or equal to 2yo subcutaneous/IM   I have discontinued William Luna "Jeff"'s dutasteride. I am also having him maintain  his aspirin EC, aspirin EC, triamcinolone, zaleplon, sertraline, levothyroxine, and atorvastatin.  Meds ordered this encounter  Medications  . zaleplon (SONATA) 10 MG capsule    Sig: Take 1 capsule (10 mg total) by mouth at bedtime as needed for sleep.    Dispense:  90 capsule    Refill:  1  . sertraline (ZOLOFT) 50 MG tablet    Sig: 50 mg (1 tab) by mouth every other day alternating with 75 mg (1.5 tabs) by mouth every other day    Dispense:  135 tablet    Refill:  1  . levothyroxine (SYNTHROID, LEVOTHROID) 112 MCG tablet    Sig: Take 1 tablet (112 mcg total) by mouth daily.    Dispense:  90 tablet    Refill:  1  . atorvastatin (LIPITOR) 10 MG tablet    Sig: Take 1 tablet (10 mg total) by mouth daily.    Dispense:  90 tablet    Refill:  1     Follow-up: Return in about 6 months (around 07/06/2018).  Scarlette Calico, MD

## 2018-01-09 ENCOUNTER — Encounter: Payer: Self-pay | Admitting: Internal Medicine

## 2018-04-16 DIAGNOSIS — H2513 Age-related nuclear cataract, bilateral: Secondary | ICD-10-CM | POA: Diagnosis not present

## 2018-05-22 DIAGNOSIS — N138 Other obstructive and reflux uropathy: Secondary | ICD-10-CM | POA: Diagnosis not present

## 2018-05-22 DIAGNOSIS — N529 Male erectile dysfunction, unspecified: Secondary | ICD-10-CM | POA: Diagnosis not present

## 2018-05-22 DIAGNOSIS — N281 Cyst of kidney, acquired: Secondary | ICD-10-CM | POA: Diagnosis not present

## 2018-05-22 DIAGNOSIS — N401 Enlarged prostate with lower urinary tract symptoms: Secondary | ICD-10-CM | POA: Diagnosis not present

## 2018-05-22 DIAGNOSIS — E038 Other specified hypothyroidism: Secondary | ICD-10-CM | POA: Diagnosis not present

## 2018-05-22 DIAGNOSIS — N411 Chronic prostatitis: Secondary | ICD-10-CM | POA: Diagnosis not present

## 2018-05-22 DIAGNOSIS — N528 Other male erectile dysfunction: Secondary | ICD-10-CM | POA: Diagnosis not present

## 2018-05-22 LAB — BASIC METABOLIC PANEL
BUN: 19 (ref 4–21)
CREATININE: 1 (ref 0.6–1.3)
Glucose: 100
Potassium: 4 (ref 3.4–5.3)
Sodium: 140 (ref 137–147)

## 2018-05-22 LAB — CBC AND DIFFERENTIAL
HCT: 38 — AB (ref 41–53)
HEMOGLOBIN: 12.7 — AB (ref 13.5–17.5)
PLATELETS: 200 (ref 150–399)
WBC: 7.6

## 2018-05-26 ENCOUNTER — Other Ambulatory Visit: Payer: Self-pay | Admitting: Urology

## 2018-05-26 DIAGNOSIS — R102 Pelvic and perineal pain: Secondary | ICD-10-CM

## 2018-05-27 ENCOUNTER — Other Ambulatory Visit (INDEPENDENT_AMBULATORY_CARE_PROVIDER_SITE_OTHER): Payer: Medicare Other

## 2018-05-27 ENCOUNTER — Ambulatory Visit (INDEPENDENT_AMBULATORY_CARE_PROVIDER_SITE_OTHER): Payer: Medicare Other | Admitting: Internal Medicine

## 2018-05-27 ENCOUNTER — Encounter: Payer: Self-pay | Admitting: Internal Medicine

## 2018-05-27 VITALS — BP 118/72 | HR 62 | Temp 98.0°F | Resp 16 | Ht 68.0 in | Wt 158.2 lb

## 2018-05-27 DIAGNOSIS — E785 Hyperlipidemia, unspecified: Secondary | ICD-10-CM | POA: Diagnosis not present

## 2018-05-27 DIAGNOSIS — R0989 Other specified symptoms and signs involving the circulatory and respiratory systems: Secondary | ICD-10-CM | POA: Diagnosis not present

## 2018-05-27 DIAGNOSIS — E034 Atrophy of thyroid (acquired): Secondary | ICD-10-CM

## 2018-05-27 DIAGNOSIS — D539 Nutritional anemia, unspecified: Secondary | ICD-10-CM

## 2018-05-27 DIAGNOSIS — R739 Hyperglycemia, unspecified: Secondary | ICD-10-CM

## 2018-05-27 DIAGNOSIS — R7989 Other specified abnormal findings of blood chemistry: Secondary | ICD-10-CM

## 2018-05-27 DIAGNOSIS — R945 Abnormal results of liver function studies: Secondary | ICD-10-CM | POA: Diagnosis not present

## 2018-05-27 DIAGNOSIS — N400 Enlarged prostate without lower urinary tract symptoms: Secondary | ICD-10-CM | POA: Diagnosis not present

## 2018-05-27 LAB — LIPID PANEL
CHOLESTEROL: 169 mg/dL (ref 0–200)
HDL: 59.4 mg/dL (ref >39.00)
LDL Cholesterol: 94 mg/dL (ref 0–99)
NONHDL: 109.45
Total CHOL/HDL Ratio: 3
Triglycerides: 76 mg/dL (ref 0.0–149.0)
VLDL: 15.2 mg/dL (ref 0.0–40.0)

## 2018-05-27 LAB — COMPREHENSIVE METABOLIC PANEL
ALBUMIN: 4.5 g/dL (ref 3.5–5.2)
ALT: 62 U/L — ABNORMAL HIGH (ref 0–53)
AST: 77 U/L — AB (ref 0–37)
Alkaline Phosphatase: 130 U/L — ABNORMAL HIGH (ref 39–117)
BILIRUBIN TOTAL: 0.6 mg/dL (ref 0.2–1.2)
BUN: 21 mg/dL (ref 6–23)
CALCIUM: 10.1 mg/dL (ref 8.4–10.5)
CHLORIDE: 103 meq/L (ref 96–112)
CO2: 33 meq/L — AB (ref 19–32)
CREATININE: 1.05 mg/dL (ref 0.40–1.50)
GFR: 74.38 mL/min (ref >60.00)
Glucose, Bld: 111 mg/dL — ABNORMAL HIGH (ref 70–99)
Potassium: 4.5 mEq/L (ref 3.5–5.1)
Sodium: 141 mEq/L (ref 135–145)
Total Protein: 7.9 g/dL (ref 6.0–8.3)

## 2018-05-27 LAB — CBC WITH DIFFERENTIAL/PLATELET
BASOS PCT: 0.3 % (ref 0.0–3.0)
Basophils Absolute: 0 10*3/uL (ref 0.0–0.1)
EOS PCT: 6.7 % — AB (ref 0.0–5.0)
Eosinophils Absolute: 0.4 10*3/uL (ref 0.0–0.7)
HCT: 42.9 % (ref 39.0–52.0)
Hemoglobin: 14.2 g/dL (ref 13.0–17.0)
Lymphocytes Relative: 26.9 % (ref 12.0–46.0)
Lymphs Abs: 1.5 10*3/uL (ref 0.7–4.0)
MCHC: 33.2 g/dL (ref 30.0–36.0)
MCV: 81.7 fl (ref 78.0–100.0)
MONOS PCT: 8.7 % (ref 3.0–12.0)
Monocytes Absolute: 0.5 10*3/uL (ref 0.1–1.0)
NEUTROS PCT: 57.4 % (ref 43.0–77.0)
Neutro Abs: 3.3 10*3/uL (ref 1.4–7.7)
Platelets: 223 10*3/uL (ref 150.0–400.0)
RBC: 5.25 Mil/uL (ref 4.22–5.81)
RDW: 15 % (ref 11.5–15.5)
WBC: 5.7 10*3/uL (ref 4.0–10.5)

## 2018-05-27 LAB — HEMOGLOBIN A1C: Hgb A1c MFr Bld: 6.2 % (ref 4.6–6.5)

## 2018-05-27 LAB — IBC PANEL
Iron: 58 ug/dL (ref 42–165)
SATURATION RATIOS: 12.5 % — AB (ref 20.0–50.0)
TRANSFERRIN: 331 mg/dL (ref 212.0–360.0)

## 2018-05-27 LAB — VITAMIN B12: Vitamin B-12: 601 pg/mL (ref 211–911)

## 2018-05-27 LAB — PSA: PSA: 1.21 ng/mL (ref 0.10–4.00)

## 2018-05-27 LAB — TSH: TSH: 0.92 u[IU]/mL (ref 0.35–4.50)

## 2018-05-27 LAB — FOLATE

## 2018-05-27 NOTE — Progress Notes (Signed)
Subjective:  Patient ID: William Luna, male    DOB: 03-Jul-1951  Age: 67 y.o. MRN: [id number]  CC: Anemia   HPI ADISA LITT presents for f/up - About 2 weeks ago he started developing frequent bowel movements, rectal pain, and loose BMs.  He never saw any blood in his stool.  Over the last week he has seen GI and GU doctors.  He has tried a course of Aleve, omeprazole, Cipro, and Flagyl.  He tells me his GU and GI doctors told him that his exams were unremarkable.  About 3 days ago he had some lab work done that showed a mild normochromic normocytic anemia.  He tells me his GU doctor has ordered a pelvic CT scan.  Outpatient Medications Prior to Visit  Medication Sig Dispense Refill  . aspirin EC 81 MG tablet Take 81 mg by mouth. Patient takes three times a week. Monday, Wednesday and Saturday.    Marland Kitchen atorvastatin (LIPITOR) 10 MG tablet Take 1 tablet (10 mg total) by mouth daily. 90 tablet 1  . levothyroxine (SYNTHROID, LEVOTHROID) 112 MCG tablet Take 1 tablet (112 mcg total) by mouth daily. 90 tablet 1  . sertraline (ZOLOFT) 50 MG tablet 50 mg (1 tab) by mouth every other day alternating with 75 mg (1.5 tabs) by mouth every other day 135 tablet 1  . zaleplon (SONATA) 10 MG capsule Take 1 capsule (10 mg total) by mouth at bedtime as needed for sleep. 90 capsule 1  . aspirin EC 81 MG tablet Take by mouth.    . triamcinolone (KENALOG) 0.1 % paste APPLY TO AFFECTED AREA UP TO 6 TIMES A DAY  0   No facility-administered medications prior to visit.     ROS Review of Systems  Constitutional: Negative for diaphoresis, fatigue and unexpected weight change.  HENT: Negative.   Eyes: Negative for visual disturbance.  Respiratory: Negative for cough, chest tightness and shortness of breath.   Cardiovascular: Negative for chest pain, palpitations and leg swelling.  Gastrointestinal: Positive for rectal pain. Negative for abdominal pain, blood in stool, constipation, diarrhea, nausea and vomiting.    Endocrine: Negative.   Genitourinary: Negative for difficulty urinating, hematuria, testicular pain and urgency.  Musculoskeletal: Negative.   Skin: Negative.  Negative for color change and rash.  Neurological: Negative.  Negative for dizziness, weakness and light-headedness.  Hematological: Negative for adenopathy. Does not bruise/bleed easily.    Objective:  BP 118/72 (BP Location: Left Arm, Patient Position: Sitting, Cuff Size: Normal)   Pulse 62   Temp 98 F (36.7 C) (Oral)   Resp 16   Ht 5\' 8"  (1.727 m)   Wt 158 lb 4 oz (71.8 kg)   SpO2 98%   BMI 24.06 kg/m   BP Readings from Last 3 Encounters:  05/27/18 118/72  01/06/18 120/60  06/19/17 120/70    Wt Readings from Last 3 Encounters:  05/27/18 158 lb 4 oz (71.8 kg)  01/06/18 152 lb 0.2 oz (69 kg)  06/19/17 152 lb (68.9 kg)    Physical Exam  Constitutional: He is oriented to person, place, and time. No distress.  HENT:  Mouth/Throat: Oropharynx is clear and moist. No oropharyngeal exudate.  Eyes: Conjunctivae are normal. No scleral icterus.  Neck: Normal range of motion. No JVD present. No thyromegaly present.  Cardiovascular: Normal rate, regular rhythm and normal heart sounds.  No murmur heard. Pulmonary/Chest: Effort normal and breath sounds normal. He has no wheezes. He has no rales.  Abdominal: Soft.  Normal appearance and bowel sounds are normal. He exhibits abdominal bruit. There is no hepatosplenomegaly. There is no tenderness. No hernia.  Musculoskeletal: Normal range of motion. He exhibits no edema, tenderness or deformity.  Neurological: He is alert and oriented to person, place, and time.  Skin: Skin is warm and dry. No rash noted. He is not diaphoretic. No erythema.  Vitals reviewed.   Lab Results  Component Value Date   WBC 5.7 05/27/2018   HGB 14.2 05/27/2018   HCT 42.9 05/27/2018   PLT 223.0 05/27/2018   GLUCOSE 111 (H) 05/27/2018   CHOL 169 05/27/2018   TRIG 76.0 05/27/2018   HDL 59.40  05/27/2018   LDLDIRECT 134.5 12/15/2006   LDLCALC 94 05/27/2018   ALT 62 (H) 05/27/2018   AST 77 (H) 05/27/2018   NA 141 05/27/2018   K 4.5 05/27/2018   CL 103 05/27/2018   CREATININE 1.05 05/27/2018   BUN 21 05/27/2018   CO2 33 (H) 05/27/2018   TSH 0.92 05/27/2018   PSA 1.21 05/27/2018   HGBA1C 6.2 05/27/2018    US Abdomen Complete  Result Date: 10/14/2016 CLINICAL DATA:  Increased LFTs. EXAM: ABDOMEN ULTRASOUND COMPLETE COMPARISON:  None. FINDINGS: Gallbladder: No gallstones or wall thickening visualized. No sonographic Murphy sign noted by sonographer. Gallbladder wall thickness is within normal limits at 2.0 mm. Common bile duct: Diameter: 3.1 mm, within normal limits Liver: Liver parenchyma is within normal limits. A benign appearing 1.2 cm cyst is present in the right lobe of the liver. No other focal lesions are present. IVC: No abnormality visualized. Pancreas: Visualized portion unremarkable. Spleen: Size and appearance within normal limits. Right Kidney: Length: 12.3 cm, within normal limits. Echogenicity within normal limits. No mass or hydronephrosis visualized. Left Kidney: Length: 12.7 cm, within normal limits. Echogenicity within normal limits. A 2.8 cm benign-appearing cyst is noted. No other focal lesions are evident. No hydronephrosis visualized. Abdominal aorta: No aneurysm visualized. Other findings: None. IMPRESSION: 1. Benign appearing 1.2 cm cysts in the liver. 2. No other focal hepatic lesions. 3. Normal appearance of the gallbladder. 4. 2.8 cm simple cyst in the left kidney. Electronically Signed   By: San Morelle M.D.   On: 10/14/2016 14:45    Assessment & Plan:   Giovannie was seen today for anemia.  Diagnoses and all orders for this visit:  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 atrophy of thyroid-his TSH is in the normal range.  He will remain on the current dose of levothyroxine.  Elevated LFTs- His LFTs remain mildly elevated.  A previous ultrasound showed  some benign liver cysts but there was no evidence of fatty liver.  I will screen him for celiac disease. -     Comprehensive metabolic panel; Future -     Gliadin antibodies, serum; Future -     Tissue transglutaminase, IgA; Future -     Reticulin Antibody, IgA w reflex titer; Future  Hyperlipidemia LDL goal <130- He has achieved his LDL goal and is doing well on the statin. -     Lipid panel; Future -     Comprehensive metabolic panel; Future  Deficiency anemia- His H&H are normal now.  I will screen him for vitamin deficiencies and celiac disease. -     CBC with Differential/Platelet; Future -     IBC panel; Future -     Vitamin B12; Future -     Folate; Future -     Vitamin B1; Future -     Gliadin antibodies,  serum; Future -     Tissue transglutaminase, IgA; Future -     Reticulin Antibody, IgA w reflex titer; Future  Abdominal bruit- Will screen for AAA. -     VAS [place] AAA DUPLEX; Future   I have discontinued Carlena Bjornstad "Jeff"'s triamcinolone. I am also having him maintain his aspirin EC, zaleplon, sertraline, levothyroxine, and atorvastatin.  No orders of the defined types were placed in this encounter.    Follow-up: No follow-ups on file.  Scarlette Calico, MD

## 2018-05-28 ENCOUNTER — Encounter: Payer: Self-pay | Admitting: Internal Medicine

## 2018-05-28 ENCOUNTER — Other Ambulatory Visit: Payer: Self-pay | Admitting: Urology

## 2018-05-28 DIAGNOSIS — R0989 Other specified symptoms and signs involving the circulatory and respiratory systems: Secondary | ICD-10-CM | POA: Insufficient documentation

## 2018-05-28 DIAGNOSIS — R102 Pelvic and perineal pain: Secondary | ICD-10-CM

## 2018-05-28 NOTE — Patient Instructions (Signed)
Anemia Anemia is a condition in which you do not have enough red blood cells or hemoglobin. Hemoglobin is a substance in red blood cells that carries oxygen. When you do not have enough red blood cells or hemoglobin (are anemic), your body cannot get enough oxygen and your organs may not work properly. As a result, you may feel very tired or have other problems. What are the causes? Common causes of anemia include:  Excessive bleeding. Anemia can be caused by excessive bleeding inside or outside the body, including bleeding from the intestine or from periods in women.  Poor nutrition.  Long-lasting (chronic) kidney, thyroid, and liver disease.  Bone marrow disorders.  Cancer and treatments for cancer.  HIV (human immunodeficiency virus) and AIDS (acquired immunodeficiency syndrome).  Treatments for HIV and AIDS.  Spleen problems.  Blood disorders.  Infections, medicines, and autoimmune disorders that destroy red blood cells.  What are the signs or symptoms? Symptoms of this condition include:  Minor weakness.  Dizziness.  Headache.  Feeling heartbeats that are irregular or faster than normal (palpitations).  Shortness of breath, especially with exercise.  Paleness.  Cold sensitivity.  Indigestion.  Nausea.  Difficulty sleeping.  Difficulty concentrating.  Symptoms may occur suddenly or develop slowly. If your anemia is mild, you may not have symptoms. How is this diagnosed? This condition is diagnosed based on:  Blood tests.  Your medical history.  A physical exam.  Bone marrow biopsy.  Your health care provider may also check your stool (feces) for blood and may do additional testing to look for the cause of your bleeding. You may also have other tests, including:  Imaging tests, such as a CT scan or MRI.  Endoscopy.  Colonoscopy.  How is this treated? Treatment for this condition depends on the cause. If you continue to lose a lot of blood,  you may need to be treated at a hospital. Treatment may include:  Taking supplements of iron, vitamin B12, or folic acid.  Taking a hormone medicine (erythropoietin) that can help to stimulate red blood cell growth.  Having a blood transfusion. This may be needed if you lose a lot of blood.  Making changes to your diet.  Having surgery to remove your spleen.  Follow these instructions at home:  Take over-the-counter and prescription medicines only as told by your health care provider.  Take supplements only as told by your health care provider.  Follow any diet instructions that you were given.  Keep all follow-up visits as told by your health care provider. This is important. Contact a health care provider if:  You develop new bleeding anywhere in the body. Get help right away if:  You are very weak.  You are short of breath.  You have pain in your abdomen or chest.  You are dizzy or feel faint.  You have trouble concentrating.  You have bloody or black, tarry stools.  You vomit repeatedly or you vomit up blood. Summary  Anemia is a condition in which you do not have enough red blood cells or enough of a substance in your red blood cells that carries oxygen (hemoglobin).  Symptoms may occur suddenly or develop slowly.  If your anemia is mild, you may not have symptoms.  This condition is diagnosed with blood tests as well as a medical history and physical exam. Other tests may be needed.  Treatment for this condition depends on the cause of the anemia. This information is not intended to replace advice   given to you by your health care provider. Make sure you discuss any questions you have with your health care provider. Document Released: 12/12/2004 Document Revised: 12/06/2016 Document Reviewed: 12/06/2016 Elsevier Interactive Patient Education  [employer].

## 2018-05-31 LAB — VITAMIN B1: Vitamin B1 (Thiamine): 12 nmol/L (ref 8–30)

## 2018-05-31 LAB — TISSUE TRANSGLUTAMINASE, IGA: (TTG) AB, IGA: 1 U/mL

## 2018-05-31 LAB — GLIADIN ANTIBODIES, SERUM
GLIADIN IGG: 5 U
Gliadin IgA: 18 Units

## 2018-05-31 LAB — RETICULIN ANTIBODIES, IGA W TITER: RETICULIN IGA SCREEN: NEGATIVE

## 2018-06-01 ENCOUNTER — Encounter: Payer: Self-pay | Admitting: Internal Medicine

## 2018-06-05 ENCOUNTER — Encounter: Payer: Self-pay | Admitting: Radiology

## 2018-06-05 ENCOUNTER — Ambulatory Visit
Admission: RE | Admit: 2018-06-05 | Discharge: 2018-06-05 | Disposition: A | Discharge status: Home / Self Care | Payer: Medicare Other | Source: Ambulatory Visit | Attending: Urology | Admitting: Urology

## 2018-06-05 DIAGNOSIS — R102 Pelvic and perineal pain: Secondary | ICD-10-CM

## 2018-06-05 DIAGNOSIS — K4090 Unilateral inguinal hernia, without obstruction or gangrene, not specified as recurrent: Secondary | ICD-10-CM | POA: Diagnosis not present

## 2018-06-05 MED ORDER — IOPAMIDOL (ISOVUE-300) INJECTION 61%
100.0000 mL | Freq: Once | INTRAVENOUS | Status: AC | PRN
  Administered 2018-06-05: 100 mL via INTRAVENOUS

## 2018-06-05 MED ORDER — IOPAMIDOL (ISOVUE-300) INJECTION 61%: 100.0000 mL | Freq: Once | INTRAVENOUS | Status: DC | PRN

## 2018-06-09 ENCOUNTER — Encounter: Payer: Self-pay | Admitting: Internal Medicine

## 2018-06-12 ENCOUNTER — Other Ambulatory Visit: Payer: Self-pay | Admitting: Internal Medicine

## 2018-06-12 ENCOUNTER — Encounter: Payer: Self-pay | Admitting: Internal Medicine

## 2018-06-12 ENCOUNTER — Ambulatory Visit (HOSPITAL_COMMUNITY)
Admission: RE | Admit: 2018-06-12 | Discharge: 2018-06-12 | Disposition: A | Discharge status: Home / Self Care | Payer: Medicare Other | Source: Ambulatory Visit | Attending: Vascular Surgery | Admitting: Vascular Surgery

## 2018-06-12 DIAGNOSIS — I774 Celiac artery compression syndrome: Secondary | ICD-10-CM

## 2018-06-12 DIAGNOSIS — R0989 Other specified symptoms and signs involving the circulatory and respiratory systems: Secondary | ICD-10-CM

## 2018-06-12 DIAGNOSIS — I771 Stricture of artery: Secondary | ICD-10-CM

## 2018-06-15 ENCOUNTER — Other Ambulatory Visit: Payer: Self-pay | Admitting: Internal Medicine

## 2018-07-21 DIAGNOSIS — D229 Melanocytic nevi, unspecified: Secondary | ICD-10-CM | POA: Diagnosis not present

## 2018-07-21 DIAGNOSIS — L821 Other seborrheic keratosis: Secondary | ICD-10-CM | POA: Diagnosis not present

## 2018-07-28 ENCOUNTER — Encounter: Payer: Self-pay | Admitting: Internal Medicine

## 2018-07-28 DIAGNOSIS — E785 Hyperlipidemia, unspecified: Secondary | ICD-10-CM

## 2018-07-28 DIAGNOSIS — R4681 Obsessive-compulsive behavior: Secondary | ICD-10-CM

## 2018-07-28 DIAGNOSIS — E034 Atrophy of thyroid (acquired): Secondary | ICD-10-CM

## 2018-07-28 MED ORDER — ATORVASTATIN CALCIUM 10 MG PO TABS
10.0000 mg | ORAL_TABLET | Freq: Every day | ORAL | 1 refills | Status: DC
Start: 2018-07-28 — End: 2019-02-08

## 2018-07-28 MED ORDER — LEVOTHYROXINE SODIUM 112 MCG PO TABS
112.0000 ug | ORAL_TABLET | Freq: Every day | ORAL | 1 refills | Status: DC
Start: 2018-07-28 — End: 2019-02-08

## 2018-07-28 MED ORDER — SERTRALINE HCL 50 MG PO TABS: ORAL_TABLET | ORAL | 1 refills | Status: DC

## 2018-08-06 ENCOUNTER — Encounter: Payer: Self-pay | Admitting: Internal Medicine

## 2018-08-06 DIAGNOSIS — F5105 Insomnia due to other mental disorder: Principal | ICD-10-CM

## 2018-08-06 DIAGNOSIS — F409 Phobic anxiety disorder, unspecified: Secondary | ICD-10-CM

## 2018-08-07 MED ORDER — ZALEPLON 10 MG PO CAPS: 10.0000 mg | ORAL_CAPSULE | Freq: Every evening | ORAL | 3 refills | Status: DC | PRN

## 2018-08-11 ENCOUNTER — Encounter: Payer: Self-pay | Admitting: Internal Medicine

## 2018-08-18 ENCOUNTER — Ambulatory Visit (INDEPENDENT_AMBULATORY_CARE_PROVIDER_SITE_OTHER): Payer: Medicare Other

## 2018-08-18 DIAGNOSIS — Z23 Encounter for immunization: Secondary | ICD-10-CM | POA: Diagnosis not present

## 2018-08-18 DIAGNOSIS — Z299 Encounter for prophylactic measures, unspecified: Secondary | ICD-10-CM

## 2018-08-24 ENCOUNTER — Telehealth: Payer: Self-pay | Admitting: *Deleted

## 2018-08-24 NOTE — Telephone Encounter (Signed)
Patient traveling to [place] [facility]). He is checking with his wife regarding yellow fever. RN gave them the occupational health clinic phone number should they decide to get the vaccine for this trip. They previously declined the vaccine, with documentation on their yellow cards regarding this decision. Landis Gandy, RN

## 2018-09-01 ENCOUNTER — Telehealth: Payer: Self-pay | Admitting: Internal Medicine

## 2018-09-01 NOTE — Telephone Encounter (Signed)
I spoke to him.  Please put him for the 315 slot tomorrow  Change banding to an OV  Thanks

## 2018-09-01 NOTE — Telephone Encounter (Signed)
Hi William Luna was my gastroenterologist before she retired. I would like to ask you to please take over as my gastroenterologist. My five year follow up colonoscopy is due in January, 2020.  If you are willing to be my doc, I have more specific requests:  I would like to get in to see you for an office visit sometime fairly soon. It would be best if I could see you  before I make a vacation trip to [place] on October 01, 2018.  I am having some lower GI symptoms. It turns out that I have a very close friend William Luna who is a Manufacturing systems engineer. I have been talking with him about my symptoms over many weeks, and it remains unclear what the problem is. Also, William Luna of Urology (many years in [place] - now in [facility]) is my urologist. I have seen him about my symptoms, and a CT with contrast has been done by [facility]. No abnormality was found. My GI friend, William Luna, has now encouraged me to work with you to proceed with sigmoidoscopy, but since I am due for colonoscopy in January, he and I suspect it would be best to proceed with colonoscopy now. He is puzzled by my symptoms. He wonders if there is a slim chance I could have proctitis or something else you might see by colonoscopy.  So that makes my request to you more complicated:  Is there a chance I could get an office visit with you and schedule a colonoscopy before I travel out of the [place] on November, 14, 2019?   Thanks for your consideration,  William Luna:  [phone]  Call or text me any time!  Email:  [email or web address]

## 2018-09-02 ENCOUNTER — Ambulatory Visit (INDEPENDENT_AMBULATORY_CARE_PROVIDER_SITE_OTHER): Payer: Medicare Other | Admitting: Internal Medicine

## 2018-09-02 ENCOUNTER — Other Ambulatory Visit: Payer: Medicare Other

## 2018-09-02 ENCOUNTER — Encounter: Payer: Self-pay | Admitting: Internal Medicine

## 2018-09-02 VITALS — BP 120/72 | HR 72 | Ht 67.0 in | Wt 159.0 lb

## 2018-09-02 DIAGNOSIS — R198 Other specified symptoms and signs involving the digestive system and abdomen: Secondary | ICD-10-CM | POA: Diagnosis not present

## 2018-09-02 DIAGNOSIS — Z86010 Personal history of colonic polyps: Secondary | ICD-10-CM | POA: Diagnosis not present

## 2018-09-02 DIAGNOSIS — R945 Abnormal results of liver function studies: Secondary | ICD-10-CM | POA: Diagnosis not present

## 2018-09-02 DIAGNOSIS — Z800 Family history of malignant neoplasm of digestive organs: Secondary | ICD-10-CM | POA: Diagnosis not present

## 2018-09-02 DIAGNOSIS — R7989 Other specified abnormal findings of blood chemistry: Secondary | ICD-10-CM

## 2018-09-02 MED ORDER — NA SULFATE-K SULFATE-MG SULF 17.5-3.13-1.6 GM/177ML PO SOLN: 1.0000 | Freq: Once | ORAL | 0 refills | Status: AC

## 2018-09-02 NOTE — Patient Instructions (Signed)
You have been scheduled for a colonoscopy. Please follow written instructions given to you at your visit today.  Please use the sample suprep kit you have been given today. If you use inhalers (even only as needed), please bring them with you on the day of your procedure.   Your provider has requested that you go to the basement level for lab work before leaving today. Press "B" on the elevator. The lab is located at the first door on the left as you exit the elevator.    I appreciate the opportunity to care for you. [name], MD, [facility]

## 2018-09-02 NOTE — Progress Notes (Signed)
William Luna 67 y.o. Nov 05, 1951 [id number]  Assessment & Plan:   Encounter Diagnoses  Name Primary?  Marland Kitchen Hx of adenomatous polyp of colon Yes  . Rectal pressure   . Abnormal LFTs   . Family history of colon cancer grandparent    The cause of his rectal pressure and fecal urgency that seems to be almost resolved is not entirely clear.  It seems like he had some sort of infectious diarrhea and symptoms afterwards.  Hopefully they are resolved and will not return.  A couple of thoughts, postinfectious IBS type problems could have been the cause.  He cycles some and sitting in the car and I think hemorrhoids could have explain some of the symptoms though a brief trial of hydrocortisone suppositories did not seem to help I do not think that rules it out.  A pudendal neuropathy is one other possibility I suppose.  It seems possible that he is responded to metronidazole which does treat Giardia, it also treats amoeba but he does not have any signs of dysentery so I doubt that is the issue.  He is about due for a repeat colonoscopy because of a history of at least one adenomatous colon polyp.  Given that and these issues will proceed with a surveillance colonoscopy next week.The risks and benefits as well as alternatives of endoscopic procedure(s) have been discussed and reviewed. All questions answered. The patient agrees to proceed.  He will take 2 Dulcolax and 4 glasses of MiraLAX on the day before prep day and then use a Suprep for his prep.  Extra prep given because of his tendency towards constipation.   There is a long history of mild increase in alkaline phosphatase and transaminases of unclear etiology.  It is not from celiac disease it is not from hemochromatosis I know from labs we have so far, I will do some autoimmune work-up and other serologies as below.  Orders Placed This Encounter  Procedures  . ANA  . Hepatitis B surface antigen  . Hepatitis B core antibody, total  .  Mitochondrial antibodies  . Alpha-1-antitrypsin  . Anti-smooth muscle antibody, IgG      Subjective:   Chief Complaint: Rectal fullness and fecal urgency  HPI William Luna is here at his request because of some symptoms he said since June.  He reviewed with me that he was traveling to [place] from [place] by car June 16 and 17.  On the 21st he drove 3 hours to [place] and had some mild diarrhea borborygmi change in bowel habits but no blood or mucus and then subsequently had a constant feeling of a need to defecate and urinate which was worse standing than sitting.  No urinary symptoms otherwise i.e. burning.  He has a close friend from residency there was a gastroenterologist retired and he communicated with him also spoke to Dr. Rosana Hoes of urology.  Empiric therapies were tried with Cipro 250 mg twice daily for 7 days took Aleve with omeprazole for 7 days also than and was evaluated by his former colleague with a normal rectal exam on June 26.  Question whether or not hemorrhoids might of caused it so steroid rectal suppositories were tried for 5 days June 23-27, question of Giardia possibly so Flagyl 250 mg twice daily for 5 days in early July align was tried and Bentyl was tried in late June early July with some help.  By July 5 he felt better.  He saw Dr. Rosana Hoes that day a  pelvic CT was ordered.  Latter scan showed no residual and the prostate exam was not tender.  Think CT is reviewed with the patient images and report.  IV and oral contrast.  Fairly large amount of stool seen throughout the colon and stable small bilateral inguinal hernias containing only fat.  Saw Dr. Ronnald Ramp of primary care no anemia normal kidney function.  A few days prior to that his hemoglobin was 12.7 a year ago was 14.6 and then on 10 July with these labs it was 14.2.  TSH normal.  PSA normal.  Iron saturation slightly low at 12.5 transferrin high normal 331 iron 58.  Iliac studies negative.  Folate normal thiamine level  normal.  Lab Results  Component Value Date   ALT 62 (H) 05/27/2018   AST 77 (H) 05/27/2018   ALKPHOS 130 (H) 05/27/2018   BILITOT 0.6 05/27/2018   Jones auscultated an abdominal bruit and Doppler ultrasound suggested increased flow in the celiac artery Dr. Ron Parker spoke to Dr. Gae Gallop and he did not think increased velocities in the celiac trunk meant anything significant.   Around mid September he had return of symptoms much less marked in original episode.  He had been back to his bike riding about twice a week 7 to 10 miles on each ride.  Fewer "good BMs" with mild lower abdominal gurgle i.e. borborygmi but no urinary urgency.  He added Metamucil daily and had been eating oatmeal.  He tends to be "on the constipated side".  10 you to dialogue with his gastroenterology colleague and friend.  Repeated a trial of Flagyl 500 mg 3 times daily for 7 days.  By October 10 mild vague residual sensation does not limit activities continuing Metamucil resume bike riding again on October 11 without obvious problems.  He feels he is 95% plus better.  Last colonoscopy 2015.  The patient has a maternal grandfather with a history of colon cancer the patient had an adenoma in 2003 and a hyperplastic polyp in the rectum in 2007.  He has a history of a question of Barrett's esophagus but subsequent biopsies did not confirm that he has a history of fundic gland polyps.  Dr. Olevia Perches was his former gastroenterologist. No Known Allergies Current Meds  Medication Sig  . aspirin EC 81 MG tablet Take 81 mg by mouth. Patient takes Monday and Thursday  . atorvastatin (LIPITOR) 10 MG tablet Take 1 tablet (10 mg total) by mouth daily.  Marland Kitchen levothyroxine (SYNTHROID, LEVOTHROID) 112 MCG tablet Take 1 tablet (112 mcg total) by mouth daily.  . sertraline (ZOLOFT) 50 MG tablet 50 mg (1 tab) by mouth every other day alternating with 75 mg (1.5 tabs) by mouth every other day  . zaleplon (SONATA) 10 MG capsule Take 1 capsule (10  mg total) by mouth at bedtime as needed for sleep.   Past Medical History:  Diagnosis Date  . Adenomatous colon polyp    2003  . ANEMIA, IRON DEFICIENCY   . BPH (benign prostatic hyperplasia)   . CERVICAL RADICULOPATHY    disectomy in 1990s  . DDD (degenerative disc disease), lumbar oct '12   L4-5, ESI therapy successful 10/12; L5-S1 radiculopathy 3/13  . Fundic gland polyps of stomach, benign   . GERD    EGD negative x 3, off medication (July '13)  . Hearing loss in left ear   . HYPERLIPIDEMIA, MILD    lipitor 10mg    . Hypothyroidism   . Liver cyst   .  Renal cyst   . 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 TRANSAMINASES, SERUM, ELEVATED    Past Surgical History:  Procedure Laterality Date  . BACK SURGERY  '90s ([place])   CERVICAL DISC - diskectomy w/ fusion, anterior  . COLONOSCOPY     Multiple  . ESOPHAGOGASTRODUODENOSCOPY     Multiple  . HEMIGLOSSECTOMY  10/30/2011   Procedure: HEMIGLOSSECTOMY;  Surgeon: Cecil Cranker;  Location: [facility] OR;  Service: ENT;  Laterality: Right;  Right tongue resection  . LUMBAR LAMINECTOMY/DECOMPRESSION MICRODISCECTOMY  01/29/2012   Procedure: LUMBAR LAMINECTOMY/DECOMPRESSION MICRODISCECTOMY;  Surgeon: Kristeen Miss, MD;  Location: [facility];  Service: Neurosurgery;  Laterality: Left;  Left L5-S1 Microdiskectomy  . LUMBAR LAMINECTOMY/DECOMPRESSION MICRODISCECTOMY  07/13/2012   Procedure: LUMBAR LAMINECTOMY/DECOMPRESSION MICRODISCECTOMY 1 LEVEL;  Surgeon: Kristeen Miss, MD;  Location: [facility] [place] [facility];  Service: Neurosurgery;  Laterality: Left;  Redo Left Lumbar five-sacral one Microdiskectomy  . nail avulsion     right first finger due to a wart  . RADICAL NECK DISSECTION  10/30/2011   Procedure: RADICAL NECK DISSECTION;  Surgeon: Cecil Cranker;  Location: [facility] OR;  Service: ENT;  Laterality: Right;   Social History   Social History Narrative     [employer] [ [place];; scholar}; [place],   [employer] residency/fellowship.    Married- 1977 - 25 years/divorced;  Married 2006.    1 son  b1980, 1 daughter - [phone]; Pearson Grippe [phone],  Step-daughter [phone].    Grandchildren - 2 (b'2010, 2013).    Work - Oncologist. Retired 2016   ACP/Living will:  Wife  with healthcare POA; Yes - CPR, Yes - short-term mechanical ventilation,  no futile care.   Patient no alcohol no tobacco or drug use   family history includes Anemia in his mother; Cirrhosis (age of onset: 68) in his mother; Colon cancer in his maternal grandfather; Diabetes in his mother; Heart disease in his father; Hyperlipidemia in his father; Hypertension in his father.   Review of Systems See HPI.  Erectile dysfunction. Wt Readings from Last 3 Encounters:  09/02/18 159 lb (72.1 kg)  05/27/18 158 lb 4 oz (71.8 kg)  01/06/18 152 lb 0.2 oz (69 kg)   No fever or other constitutional symptoms reported  Objective:   Physical Exam BP 120/72 (BP Location: Left Arm, Patient Position: Sitting, Cuff Size: Normal)   Pulse 72   Ht 5\' 7"  (1.702 m) Comment: height measured without shoes  Wt 159 lb (72.1 kg)   BMI 24.90 kg/m  NAD Anicteric Lungs cta Cor s1s2 no rmg abd soft NT + epigastric bruit no HSM/mass Rectal exam is deferred until colonoscopy approp mood /affect A and o x 3   See HPI for data reviewed.  Additionally the patient brought own records of mild transaminase elevations 1998 2002 ranging from 39-49 for AST 40-53 for ALT in 1 30-1 71 for alkaline phosphatase.  He has a liver cyst on an ultrasound in 2017 as well as a kidney cyst but no other abnormalities.

## 2018-09-04 ENCOUNTER — Other Ambulatory Visit: Payer: Self-pay | Admitting: Dermatology

## 2018-09-04 DIAGNOSIS — I868 Varicose veins of other specified sites: Secondary | ICD-10-CM | POA: Diagnosis not present

## 2018-09-04 DIAGNOSIS — D485 Neoplasm of uncertain behavior of skin: Secondary | ICD-10-CM | POA: Diagnosis not present

## 2018-09-04 NOTE — Progress Notes (Signed)
Mitochondrial abs high suggesting PBC

## 2018-09-05 LAB — MITOCHONDRIAL ANTIBODIES: MITOCHONDRIAL M2 AB, IGG: 130.1 U — AB

## 2018-09-05 LAB — HEPATITIS B SURFACE ANTIGEN: Hepatitis B Surface Ag: NONREACTIVE

## 2018-09-05 LAB — ANTI-NUCLEAR AB-TITER (ANA TITER): ANA Titer 1: 1:320 {titer} — ABNORMAL HIGH

## 2018-09-05 LAB — ALPHA-1-ANTITRYPSIN: A1 ANTITRYPSIN SER: 172 mg/dL (ref 83–199)

## 2018-09-05 LAB — ANTI-SMOOTH MUSCLE ANTIBODY, IGG: ACTIN (SMOOTH MUSCLE) ANTIBODY (IGG): 35 U — AB (ref <20)

## 2018-09-05 LAB — ANA: ANA: POSITIVE — AB

## 2018-09-05 LAB — HEPATITIS B CORE ANTIBODY, TOTAL: HEP B C TOTAL AB: NONREACTIVE

## 2018-09-07 ENCOUNTER — Encounter: Payer: Self-pay | Admitting: Internal Medicine

## 2018-09-07 ENCOUNTER — Ambulatory Visit (AMBULATORY_SURGERY_CENTER): Payer: Medicare Other | Admitting: Internal Medicine

## 2018-09-07 ENCOUNTER — Other Ambulatory Visit (INDEPENDENT_AMBULATORY_CARE_PROVIDER_SITE_OTHER): Payer: Medicare Other

## 2018-09-07 VITALS — BP 105/60 | HR 60 | Temp 97.3°F | Resp 14 | Ht 67.0 in | Wt 159.0 lb

## 2018-09-07 DIAGNOSIS — K635 Polyp of colon: Secondary | ICD-10-CM | POA: Diagnosis not present

## 2018-09-07 DIAGNOSIS — R945 Abnormal results of liver function studies: Secondary | ICD-10-CM

## 2018-09-07 DIAGNOSIS — R7989 Other specified abnormal findings of blood chemistry: Secondary | ICD-10-CM

## 2018-09-07 DIAGNOSIS — Z86010 Personal history of colonic polyps: Secondary | ICD-10-CM

## 2018-09-07 DIAGNOSIS — R197 Diarrhea, unspecified: Secondary | ICD-10-CM

## 2018-09-07 DIAGNOSIS — Z1211 Encounter for screening for malignant neoplasm of colon: Secondary | ICD-10-CM | POA: Diagnosis not present

## 2018-09-07 DIAGNOSIS — K621 Rectal polyp: Secondary | ICD-10-CM

## 2018-09-07 LAB — CBC WITH DIFFERENTIAL/PLATELET
BASOS PCT: 0.4 % (ref 0.0–3.0)
Basophils Absolute: 0 10*3/uL (ref 0.0–0.1)
EOS PCT: 7.5 % — AB (ref 0.0–5.0)
Eosinophils Absolute: 0.4 10*3/uL (ref 0.0–0.7)
HEMATOCRIT: 41.8 % (ref 39.0–52.0)
HEMOGLOBIN: 14.2 g/dL (ref 13.0–17.0)
LYMPHS PCT: 33 % (ref 12.0–46.0)
Lymphs Abs: 1.6 10*3/uL (ref 0.7–4.0)
MCHC: 33.9 g/dL (ref 30.0–36.0)
MCV: 83.4 fl (ref 78.0–100.0)
MONOS PCT: 9.2 % (ref 3.0–12.0)
Monocytes Absolute: 0.4 10*3/uL (ref 0.1–1.0)
NEUTROS ABS: 2.4 10*3/uL (ref 1.4–7.7)
Neutrophils Relative %: 49.9 % (ref 43.0–77.0)
PLATELETS: 218 10*3/uL (ref 150.0–400.0)
RBC: 5.01 Mil/uL (ref 4.22–5.81)
RDW: 15.6 % — ABNORMAL HIGH (ref 11.5–15.5)
WBC: 4.9 10*3/uL (ref 4.0–10.5)

## 2018-09-07 LAB — COMPREHENSIVE METABOLIC PANEL
ALBUMIN: 4 g/dL (ref 3.5–5.2)
ALT: 62 U/L — ABNORMAL HIGH (ref 0–53)
AST: 57 U/L — ABNORMAL HIGH (ref 0–37)
Alkaline Phosphatase: 125 U/L — ABNORMAL HIGH (ref 39–117)
BUN: 14 mg/dL (ref 6–23)
CALCIUM: 9.4 mg/dL (ref 8.4–10.5)
CHLORIDE: 101 meq/L (ref 96–112)
CO2: 30 meq/L (ref 19–32)
CREATININE: 0.9 mg/dL (ref 0.40–1.50)
GFR: 88.79 mL/min (ref >60.00)
Glucose, Bld: 89 mg/dL (ref 70–99)
POTASSIUM: 3.9 meq/L (ref 3.5–5.1)
Sodium: 140 mEq/L (ref 135–145)
Total Bilirubin: 0.6 mg/dL (ref 0.2–1.2)
Total Protein: 7.4 g/dL (ref 6.0–8.3)

## 2018-09-07 LAB — HM COLONOSCOPY

## 2018-09-07 MED ORDER — SODIUM CHLORIDE 0.9 % IV SOLN: 500.0000 mL | Freq: Once | INTRAVENOUS | Status: DC

## 2018-09-07 NOTE — Op Note (Signed)
[place] Patient Name: William Luna Procedure Date: 09/07/2018 7:36 AM MRN: [id number] Endoscopist: Gatha Mayer , MD Age: 67 Referring MD:  Date of Birth: [birth year] Gender: Male Account #: [id number] Procedure:                Colonoscopy Indications:              Surveillance: Personal history of adenomatous                            polyps on last colonoscopy > 3 years ago Medicines:                Propofol per Anesthesia, Monitored Anesthesia Care Procedure:                Pre-Anesthesia Assessment:                           - Prior to the procedure, a History and Physical                            was performed, and patient medications and                            allergies were reviewed. The patient's tolerance of                            previous anesthesia was also reviewed. The risks                            and benefits of the procedure and the sedation                            options and risks were discussed with the patient.                            All questions were answered, and informed consent                            was obtained. Prior Anticoagulants: The patient has                            taken no previous anticoagulant or antiplatelet                            agents. ASA Grade Assessment: II - A patient with                            mild systemic disease. After reviewing the risks                            and benefits, the patient was deemed in                            satisfactory condition to undergo the procedure.  After obtaining informed consent, the colonoscope                            was passed under direct vision. Throughout the                            procedure, the patient's blood pressure, pulse, and                            oxygen saturations were monitored continuously. The                            Colonoscope was introduced through the anus and   advanced to the the terminal ileum, with                            identification of the appendiceal orifice and IC                            valve. The quality of the bowel preparation was                            excellent. The colonoscopy was performed without                            difficulty. The patient tolerated the procedure                            well. The bowel preparation used was SUPREP,                            Dulcolax + MiraLax given day before prep alsp. The                            terminal ileum, ileocecal valve, appendiceal                            orifice, and rectum were photographed. Scope In: 9:93:71 AM Scope Out: 8:02:29 AM Scope Withdrawal Time: 0 hours 13 minutes 17 seconds  Total Procedure Duration: 0 hours 16 minutes 18 seconds  Findings:                 The perianal examination was normal.                           The colon (entire examined portion) appeared                            normal. Biopsies for histology were taken with a                            cold forceps from the rectum and entire colon for                            evaluation  of microscopic colitis. Verification of                            patient identification for the specimen was done.                            Estimated blood loss was minimal.                           No additional abnormalities were found on                            retroflexion.                           The digital rectal exam findings include enlarged                            prostate. Pertinent negatives include no palpable                            rectal lesions. Complications:            No immediate complications. Estimated blood loss:                            None. Estimated Blood Loss:     Estimated blood loss: none. Impression:               - The entire examined colon is normal. Biopsied.                            Colon and rectum                           - Enlarged  prostate found on digital rectal exam.                           - Personal history of colonic polyps. Recent                            diarrhea and then rectal pressure ? tenesmus sxs                            afterward Recommendation:           - Repeat colonoscopy date to be determined after                            pending pathology results are reviewed for                            surveillance.                           - Patient has a contact number available for  emergencies. The signs and symptoms of potential                            delayed complications were discussed with the                            patient. Return to normal activities tomorrow.                            Written discharge instructions were provided to the                            patient.                           - Resume previous diet.                           - Continue present medications.                           - CBC, CMET IgG TODAY                           MY OFFICE WILL ARRANGE A COMPLETE ABDOMINAL                            ULTRASOUND RE: ABNL LFT'S Gatha Mayer, MD 09/07/2018 8:16:10 AM This report has been signed electronically.

## 2018-09-07 NOTE — Patient Instructions (Addendum)
Things looked fine - no polyps, no inflammation.  Given history and abnormal autoimmune studies I took colon and rectal biopsies.  Should know by early next week about those.  I am checking CBC, CMET, IgG level given + autoimmune markers and last of these labs in July.  My office will arrange an ultrasound.  I appreciate the opportunity to care for you. Gatha Mayer, MD, FACG   YOU HAD AN ENDOSCOPIC PROCEDURE TODAY AT [facility]:   Refer to the procedure report that was given to you for any specific questions about what was found during the examination.  If the procedure report does not answer your questions, please call your gastroenterologist to clarify.  If you requested that your care partner not be given the details of your procedure findings, then the procedure report has been included in a sealed envelope for you to review at your convenience later.  YOU SHOULD EXPECT: Some feelings of bloating in the abdomen. Passage of more gas than usual.  Walking can help get rid of the air that was put into your GI tract during the procedure and reduce the bloating. If you had a lower endoscopy (such as a colonoscopy or flexible sigmoidoscopy) you may notice spotting of blood in your stool or on the toilet paper. If you underwent a bowel prep for your procedure, you may not have a normal bowel movement for a few days.  Please Note:  You might notice some irritation and congestion in your nose or some drainage.  This is from the oxygen used during your procedure.  There is no need for concern and it should clear up in a day or so.  SYMPTOMS TO REPORT IMMEDIATELY:   Following lower endoscopy (colonoscopy or flexible sigmoidoscopy):  Excessive amounts of blood in the stool  Significant tenderness or worsening of abdominal pains  Swelling of the abdomen that is new, acute  Fever of 100F or higher   For urgent or emergent issues, a gastroenterologist can be reached  at any hour by calling [phone].   DIET:  We do recommend a small meal at first, but then you may proceed to your regular diet.  Drink plenty of fluids but you should avoid alcoholic beverages for 24 hours.  ACTIVITY:  You should plan to take it easy for the rest of today and you should NOT DRIVE or use heavy machinery until tomorrow (because of the sedation medicines used during the test).    FOLLOW UP: Our staff will call the number listed on your records the next business day following your procedure to check on you and address any questions or concerns that you may have regarding the information given to you following your procedure. If we do not reach you, we will leave a message.  However, if you are feeling well and you are not experiencing any problems, there is no need to return our call.  We will assume that you have returned to your regular daily activities without incident.  If any biopsies were taken you will be contacted by phone or by letter within the next 1-3 weeks.  Please call us at [phone] if you have not heard about the biopsies in 3 weeks.    SIGNATURES/CONFIDENTIALITY: You and/or your care partner have signed paperwork which will be entered into your electronic medical record.  These signatures attest to the fact that that the information above on your After Visit Summary has been reviewed and is  understood.  Full responsibility of the confidentiality of this discharge information lies with you and/or your care-partner. 

## 2018-09-07 NOTE — Progress Notes (Signed)
Called to room to assist during endoscopic procedure.  Patient ID and intended procedure confirmed with present staff. Received instructions for my participation in the procedure from the performing physician.  

## 2018-09-07 NOTE — Progress Notes (Signed)
A/ox3 pleased with MAC, report to RN 

## 2018-09-08 ENCOUNTER — Telehealth: Payer: Self-pay

## 2018-09-08 LAB — IGG: IgG (Immunoglobin G), Serum: 1269 mg/dL (ref 600–1540)

## 2018-09-08 NOTE — Telephone Encounter (Signed)
  Follow up Call-  Call back number 09/07/2018  Post procedure Call Back phone  # [phone]  Permission to leave phone message Yes  Some recent data might be hidden     Patient questions:  Do you have a fever, pain , or abdominal swelling? No. Pain Score  0 *  Have you tolerated food without any problems? Yes.    Have you been able to return to your normal activities? Yes.    Do you have any questions about your discharge instructions: Diet   No. Medications  No. Follow up visit  No.  Do you have questions or concerns about your Care? No.  Actions: * If pain score is 4 or above: No action needed, pain <4.

## 2018-09-08 NOTE — Telephone Encounter (Signed)
No answer, left message to call back later today, B.Carrick Rijos RN. 

## 2018-09-09 ENCOUNTER — Other Ambulatory Visit (INDEPENDENT_AMBULATORY_CARE_PROVIDER_SITE_OTHER): Payer: Medicare Other

## 2018-09-09 ENCOUNTER — Other Ambulatory Visit: Payer: Self-pay

## 2018-09-09 DIAGNOSIS — R7989 Other specified abnormal findings of blood chemistry: Secondary | ICD-10-CM

## 2018-09-09 DIAGNOSIS — R945 Abnormal results of liver function studies: Secondary | ICD-10-CM

## 2018-09-09 LAB — PROTIME-INR
INR: 1.1 ratio — ABNORMAL HIGH (ref 0.8–1.0)
Prothrombin Time: 12.6 s (ref 9.6–13.1)

## 2018-09-09 NOTE — Progress Notes (Signed)
I spoke to Dr. Ron Parker Reviewed results -  Plan is 1) do complete abdominal ultrasound at [facility] radiology hopefully this week dx abnl LFT's 2) I am working on his liver biopsy and will let you know (specific request)

## 2018-09-09 NOTE — Progress Notes (Signed)
Pt

## 2018-09-09 NOTE — Addendum Note (Signed)
Addended by: Marlon Pel on: 09/09/2018 11:26 AM   Modules accepted: Orders

## 2018-09-09 NOTE — Progress Notes (Signed)
Please also request ultrasound guided liver biopsy and request Dr. Kathlene Cote

## 2018-09-09 NOTE — Progress Notes (Signed)
us

## 2018-09-10 ENCOUNTER — Other Ambulatory Visit: Payer: Self-pay | Admitting: Internal Medicine

## 2018-09-10 DIAGNOSIS — R945 Abnormal results of liver function studies: Secondary | ICD-10-CM

## 2018-09-10 DIAGNOSIS — R7989 Other specified abnormal findings of blood chemistry: Secondary | ICD-10-CM

## 2018-09-10 NOTE — Progress Notes (Signed)
normal

## 2018-09-14 ENCOUNTER — Ambulatory Visit (HOSPITAL_COMMUNITY)
Admission: RE | Admit: 2018-09-14 | Discharge: 2018-09-14 | Disposition: A | Discharge status: Home / Self Care | Payer: Medicare Other | Source: Ambulatory Visit | Attending: Internal Medicine | Admitting: Internal Medicine

## 2018-09-14 DIAGNOSIS — K7689 Other specified diseases of liver: Secondary | ICD-10-CM | POA: Diagnosis not present

## 2018-09-14 DIAGNOSIS — R945 Abnormal results of liver function studies: Secondary | ICD-10-CM | POA: Insufficient documentation

## 2018-09-14 DIAGNOSIS — N281 Cyst of kidney, acquired: Secondary | ICD-10-CM | POA: Diagnosis not present

## 2018-09-14 DIAGNOSIS — R7989 Other specified abnormal findings of blood chemistry: Secondary | ICD-10-CM

## 2018-09-14 NOTE — Progress Notes (Signed)
No letter Message sent 5 year colon recall

## 2018-09-15 NOTE — Progress Notes (Signed)
My Chart message   [place] ok

## 2018-09-16 ENCOUNTER — Other Ambulatory Visit: Payer: Self-pay | Admitting: Student

## 2018-09-16 DIAGNOSIS — Z4802 Encounter for removal of sutures: Secondary | ICD-10-CM | POA: Diagnosis not present

## 2018-09-17 ENCOUNTER — Ambulatory Visit (HOSPITAL_COMMUNITY)
Admission: RE | Admit: 2018-09-17 | Discharge: 2018-09-17 | Disposition: A | Discharge status: Home / Self Care | Payer: Medicare Other | Source: Ambulatory Visit | Attending: Internal Medicine | Admitting: Internal Medicine

## 2018-09-17 ENCOUNTER — Encounter (HOSPITAL_COMMUNITY): Payer: Self-pay

## 2018-09-17 DIAGNOSIS — Z79890 Hormone replacement therapy (postmenopausal): Secondary | ICD-10-CM | POA: Insufficient documentation

## 2018-09-17 DIAGNOSIS — E785 Hyperlipidemia, unspecified: Secondary | ICD-10-CM | POA: Insufficient documentation

## 2018-09-17 DIAGNOSIS — Z8249 Family history of ischemic heart disease and other diseases of the circulatory system: Secondary | ICD-10-CM | POA: Diagnosis not present

## 2018-09-17 DIAGNOSIS — Z79899 Other long term (current) drug therapy: Secondary | ICD-10-CM | POA: Diagnosis not present

## 2018-09-17 DIAGNOSIS — Z8379 Family history of other diseases of the digestive system: Secondary | ICD-10-CM | POA: Diagnosis not present

## 2018-09-17 DIAGNOSIS — Z86010 Personal history of colonic polyps: Secondary | ICD-10-CM | POA: Insufficient documentation

## 2018-09-17 DIAGNOSIS — Z833 Family history of diabetes mellitus: Secondary | ICD-10-CM | POA: Diagnosis not present

## 2018-09-17 DIAGNOSIS — K8309 Other cholangitis: Secondary | ICD-10-CM | POA: Insufficient documentation

## 2018-09-17 DIAGNOSIS — Z981 Arthrodesis status: Secondary | ICD-10-CM | POA: Insufficient documentation

## 2018-09-17 DIAGNOSIS — H9192 Unspecified hearing loss, left ear: Secondary | ICD-10-CM | POA: Diagnosis not present

## 2018-09-17 DIAGNOSIS — Z85810 Personal history of malignant neoplasm of tongue: Secondary | ICD-10-CM | POA: Diagnosis not present

## 2018-09-17 DIAGNOSIS — E039 Hypothyroidism, unspecified: Secondary | ICD-10-CM | POA: Diagnosis not present

## 2018-09-17 DIAGNOSIS — R945 Abnormal results of liver function studies: Secondary | ICD-10-CM

## 2018-09-17 DIAGNOSIS — Z7982 Long term (current) use of aspirin: Secondary | ICD-10-CM | POA: Insufficient documentation

## 2018-09-17 DIAGNOSIS — R7989 Other specified abnormal findings of blood chemistry: Secondary | ICD-10-CM | POA: Insufficient documentation

## 2018-09-17 LAB — CBC
HEMATOCRIT: 43.4 % (ref 39.0–52.0)
Hemoglobin: 13.9 g/dL (ref 13.0–17.0)
MCH: 27.1 pg (ref 26.0–34.0)
MCHC: 32 g/dL (ref 30.0–36.0)
MCV: 84.8 fL (ref 80.0–100.0)
Platelets: 216 10*3/uL (ref 150–400)
RBC: 5.12 MIL/uL (ref 4.22–5.81)
RDW: 14.6 % (ref 11.5–15.5)
WBC: 5.8 10*3/uL (ref 4.0–10.5)
nRBC: 0 % (ref 0.0–0.2)

## 2018-09-17 LAB — PROTIME-INR
INR: 0.89
Prothrombin Time: 12 seconds (ref 11.4–15.2)

## 2018-09-17 LAB — APTT: aPTT: 31 seconds (ref 24–36)

## 2018-09-17 MED ORDER — FENTANYL CITRATE (PF) 100 MCG/2ML IJ SOLN
INTRAMUSCULAR | Status: AC | PRN
  Administered 2018-09-17: 50 ug via INTRAVENOUS
  Administered 2018-09-17: 25 ug via INTRAVENOUS

## 2018-09-17 MED ORDER — MIDAZOLAM HCL 2 MG/2ML IJ SOLN
INTRAMUSCULAR | Status: AC
  Filled 2018-09-17: qty 2

## 2018-09-17 MED ORDER — SODIUM CHLORIDE 0.9 % IV SOLN: INTRAVENOUS | Status: DC

## 2018-09-17 MED ORDER — MIDAZOLAM HCL 2 MG/2ML IJ SOLN
INTRAMUSCULAR | Status: AC | PRN
  Administered 2018-09-17 (×2): 0.5 mg via INTRAVENOUS
  Administered 2018-09-17: 1 mg via INTRAVENOUS

## 2018-09-17 MED ORDER — GELATIN ABSORBABLE 12-7 MM EX MISC
CUTANEOUS | Status: AC
  Filled 2018-09-17: qty 1

## 2018-09-17 MED ORDER — FENTANYL CITRATE (PF) 100 MCG/2ML IJ SOLN
INTRAMUSCULAR | Status: AC
  Filled 2018-09-17: qty 2

## 2018-09-17 MED ORDER — LIDOCAINE HCL (PF) 1 % IJ SOLN
INTRAMUSCULAR | Status: AC
  Filled 2018-09-17: qty 30

## 2018-09-17 NOTE — H&P (Signed)
Chief Complaint: Patient was seen in consultation today for random liver biopsy at the request of William Luna  Referring Physician(s): William Luna  Supervising Physician: William Luna  Patient Status: William Luna  History of Present Illness: William Luna is a 67 y.o. male being worked up for elevated LFTs. He is referred for US guided random liver biopsy. PMHx, meds, labs, imaging, allergies reviewed. Feels well, no recent fevers, chills, illness. Has been NPO today as directed. Wife at bedside.   Past Medical History:  Diagnosis Date  . Adenomatous colon polyp    2003  . ANEMIA, IRON DEFICIENCY   . BPH (benign prostatic hyperplasia)   . CERVICAL RADICULOPATHY    disectomy in 1990s  . DDD (degenerative disc disease), lumbar oct '12   L4-5, ESI therapy successful 10/12; L5-S1 radiculopathy 3/13  . Fundic gland polyps of stomach, benign   . GERD    EGD negative x 3, off medication (July '13)  . Hearing loss in left ear   . HYPERLIPIDEMIA, MILD    lipitor 10mg    . Hypothyroidism   . Liver cyst   . Renal cyst   . 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 TRANSAMINASES, SERUM, ELEVATED     Past Surgical History:  Procedure Laterality Date  . BACK SURGERY  '90s ([place])   CERVICAL DISC - diskectomy w/ fusion, anterior  . COLONOSCOPY     Multiple  . ESOPHAGOGASTRODUODENOSCOPY     Multiple  . HEMIGLOSSECTOMY  10/30/2011   Procedure: HEMIGLOSSECTOMY;  Surgeon: Cecil Cranker;  Location: [facility] OR;  Service: ENT;  Laterality: Right;  Right tongue resection  . LUMBAR LAMINECTOMY/DECOMPRESSION MICRODISCECTOMY  01/29/2012   Procedure: LUMBAR LAMINECTOMY/DECOMPRESSION MICRODISCECTOMY;  Surgeon: Kristeen Miss, MD;  Location: [place] [facility];  Service: Neurosurgery;  Laterality: Left;  Left L5-S1 Microdiskectomy  . LUMBAR LAMINECTOMY/DECOMPRESSION MICRODISCECTOMY   07/13/2012   Procedure: LUMBAR LAMINECTOMY/DECOMPRESSION MICRODISCECTOMY 1 LEVEL;  Surgeon: Kristeen Miss, MD;  Location: [place] [facility];  Service: Neurosurgery;  Laterality: Left;  Redo Left Lumbar five-sacral one Microdiskectomy  . nail avulsion     right first finger due to a wart  . RADICAL NECK DISSECTION  10/30/2011   Procedure: RADICAL NECK DISSECTION;  Surgeon: Cecil Cranker;  Location: [facility] OR;  Service: ENT;  Laterality: Right;    Allergies: Patient has no known allergies.  Medications: Prior to Admission medications   Medication Sig Start Date End Date Taking? Authorizing Provider  levothyroxine (SYNTHROID, LEVOTHROID) 112 MCG tablet Take 1 tablet (112 mcg total) by mouth daily. 07/28/18  Yes Janith Lima, MD  sertraline (ZOLOFT) 50 MG tablet 50 mg (1 tab) by mouth every other day alternating with 75 mg (1.5 tabs) by mouth every other day Patient taking differently: Take 50-75 mg by mouth See admin instructions. 50 mg (1 tab) by mouth every other day alternating with 75 mg (1.5 tabs) by mouth every other day 07/28/18  Yes Janith Lima, MD  zaleplon (SONATA) 10 MG capsule Take 1 capsule (10 mg total) by mouth at bedtime as needed for sleep. 08/07/18  Yes Janith Lima, MD  aspirin EC 81 MG tablet Take 81 mg by mouth. Patient takes Monday and Thursday    [provider]  atorvastatin (LIPITOR) 10 MG tablet Take 1 tablet (10 mg total) by mouth daily. 07/28/18   Janith Lima, MD  Family History  Problem Relation Age of Onset  . Diabetes Mother   . Anemia Mother   . Cirrhosis Mother 51       primary biliary cirrhosis  . Hyperlipidemia Father   . Hypertension Father   . Heart disease Father   . Colon cancer Maternal Grandfather   . COPD Neg Hx     Social History   Socioeconomic History  . Marital status: Married    Spouse name: [name]  . Number of children: 2  . Years of education: 43  . Highest education level: Not on file  Occupational History  .  Occupation: Secretary/administrator: [employer]  . Financial resource strain: Not hard at all  . Food insecurity:    Worry: Never true    Inability: Never true  . Transportation needs:    Medical: No    Non-medical: No  Tobacco Use  . Smoking status: Never Smoker  . Smokeless tobacco: Never Used  Substance and Sexual Activity  . Alcohol use: Yes    Alcohol/week: 0.0 standard drinks    Comment: OCC.  . Drug use: No  . Sexual activity: Yes    Partners: Female    Birth control/protection: None  Lifestyle  . Physical activity:    Days per week: Not on file    Minutes per session: Not on file  . Stress: Very much  Relationships  . Social connections:    Talks on phone: More than three times a week    Gets together: More than three times a week    Attends religious service: More than 4 times per year    Active member of club or organization: Not on file    Attends meetings of clubs or organizations: More than 4 times per year    Relationship status: Married  Other Topics Concern  . Not on file  Social History Narrative   [employer] [ [employer];; scholar}; [place],   [employer] residency/fellowship.    Married- 1977 - 25 years/divorced;  Married 2006.    1 son  b1980, 1 daughter - [phone]; Pearson Grippe [phone],  Step-daughter [phone].    Grandchildren - 2 (b'2010, 2013).    Work - Oncologist. Retired 2016   ACP/Living will:  Wife  with healthcare POA; Yes - CPR, Yes - short-term mechanical ventilation,  no futile care.   Patient no alcohol no tobacco or drug use     Review of Systems: A 12 point ROS discussed and pertinent positives are indicated in the HPI above.  All other systems are negative.  Review of Systems  Vital Signs: BP 110/69   Pulse 68   Temp 97.9 F (36.6 C) (Oral)   Resp 16   Ht 5\' 8"  (1.727 m)   Wt 68 kg   SpO2 100%   BMI 22.81 kg/m   Physical Exam  Constitutional: He is oriented to person, place, and time. He  appears well-developed. No distress.  HENT:  Mouth/Throat: Oropharynx is clear and moist.  Neck: Normal range of motion. No JVD present. No tracheal deviation present.  Neurological: He is alert and oriented to person, place, and time.  Skin: Skin is warm and dry.  Psychiatric: He has a normal mood and affect. Judgment normal.    Imaging: US Abdomen Complete  Result Date: 09/14/2018 CLINICAL DATA:  Increased LFTs EXAM: ABDOMEN ULTRASOUND COMPLETE COMPARISON:  06/05/2018 FINDINGS: Gallbladder: No gallstones or wall thickening visualized. No sonographic Percell Miller  sign noted by sonographer. Common bile duct: Diameter: 4.4 mm Liver: Normal echogenicity. No biliary dilatation or obstruction. Right hepatic anechoic cyst measures 1.4 cm. No other significant hepatic abnormality. Portal vein is patent on color Doppler imaging with normal direction of blood flow towards the liver. IVC: No abnormality visualized. Pancreas: Visualized portion unremarkable. Spleen: Size and appearance within normal limits. Right Kidney: Length: 11.9 cm. Echogenicity within normal limits. No mass or hydronephrosis visualized. Left Kidney: Length: 12.4 cm. Normal echogenicity. No hydronephrosis. Anechoic exophytic cyst measures 3.2 cm in the midpole. Abdominal aorta: No aneurysm visualized. Other findings: No free fluid or ascites. IMPRESSION: Hepatic and left renal cysts. No other acute finding by ultrasound. Electronically Signed   By: Jerilynn Mages.  Shick M.D.   On: 09/14/2018 09:08    Labs:  CBC: Recent Labs    05/22/18 05/27/18 1114 09/07/18 0838 09/17/18 0610  WBC 7.6 5.7 4.9 5.8  HGB 12.7* 14.2 14.2 13.9  HCT 38* 42.9 41.8 43.4  PLT 200 223.0 218.0 216    COAGS: Recent Labs    09/09/18 1203 09/17/18 0610  INR 1.1* 0.89  APTT  --  31    BMP: Recent Labs    05/22/18 05/27/18 1114 09/07/18 0838  NA 140 141 140  K 4.0 4.5 3.9  CL  --  103 101  CO2  --  33* 30  GLUCOSE  --  111* 89  BUN 19 21 14   CALCIUM  --   10.1 9.4  CREATININE 1.0 1.05 0.90    LIVER FUNCTION TESTS: Recent Labs    05/27/18 1114 09/07/18 0838  BILITOT 0.6 0.6  AST 77* 57*  ALT 62* 62*  ALKPHOS 130* 125*  PROT 7.9 7.4  ALBUMIN 4.5 4.0    TUMOR MARKERS: No results for input(s): AFPTM, CEA, CA199, CHROMGRNA in the last 8760 hours.  Assessment and Plan: Elevated LFTs For US guided liver biopsy Labs ok Risks and benefits discussed with the patient including, but not limited to bleeding, infection, damage to adjacent structures or low yield requiring additional tests.  All of the patient's questions were answered, patient is agreeable to proceed. Consent signed and in chart.    Thank you for this interesting consult.  I greatly enjoyed meeting William Luna and look forward to participating in their care.  A copy of this report was sent to the requesting provider on this date.  Electronically Signed: Ascencion Dike, PA-C 09/17/2018, 7:14 AM   I spent a total of 20 minutes in face to face in clinical consultation, greater than 50% of which was counseling/coordinating care for liver biopsy

## 2018-09-17 NOTE — Procedures (Signed)
Interventional Radiology Procedure Note  Procedure: US guided biopsy of liver  Complications: None  Estimated Blood Loss: < 10 mL  Findings: 18 G core biopsy x 3 via 17 G needle in right lobe of liver. Gelfoam pledgets advanced through outer needle.  Venetia Night. Kathlene Cote, M.D Pager:  [phone]

## 2018-09-17 NOTE — Discharge Instructions (Addendum)
Liver Biopsy, Care After °These instructions give you information on caring for yourself after your procedure. Your doctor may also give you more specific instructions. Call your doctor if you have any problems or questions after your procedure. °Follow these instructions at home: °· Rest at home for 1-2 days or as told by your doctor. °· Have someone stay with you for at least 24 hours. °· Do not do these things in the first 24 hours: °? Drive. °? Use machinery. °? Take care of other people. °? Sign legal documents. °? Take a bath or shower. °· There are many different ways to close and cover a cut (incision). For example, a cut can be closed with stitches, skin glue, or adhesive strips. Follow your doctor's instructions on: °? Taking care of your cut. °? Changing and removing your bandage (dressing). °? Removing whatever was used to close your cut. °· Do not drink alcohol in the first week. °· Do not lift more than 5 pounds or play contact sports for the first 2 weeks. °· Take medicines only as told by your doctor. For 1 week, do not take medicine that has aspirin in it or medicines like ibuprofen. °· Get your test results. °Contact a doctor if: °· A cut bleeds and leaves more than just a small spot of blood. °· A cut is red, puffs up (swells), or hurts more than before. °· Fluid or something else comes from a cut. °· A cut smells bad. °· You have a fever or chills. °Get help right away if: °· You have swelling, bloating, or pain in your belly (abdomen). °· You get dizzy or faint. °· You have a rash. °· You feel sick to your stomach (nauseous) or throw up (vomit). °· You have trouble breathing, feel short of breath, or feel faint. °· Your chest hurts. °· You have problems talking or seeing. °· You have trouble balancing or moving your arms or legs. °This information is not intended to replace advice given to you by your health care provider. Make sure you discuss any questions you have with your health care  provider. °Document Released: 08/13/2008 Document Revised: 04/11/2016 Document Reviewed: 12/31/2013 °Elsevier Interactive Patient Education © 2018 Elsevier Inc[employer] ° ° ° ° °Moderate Conscious Sedation, Adult, Care After °These instructions provide you with information about caring for yourself after your procedure. Your health care provider may also give you more specific instructions. Your treatment has been planned according to current medical practices, but problems sometimes occur. Call your health care provider if you have any problems or questions after your procedure. °What can I expect after the procedure? °After your procedure, it is common: °· To feel sleepy for several hours. °· To feel clumsy and have poor balance for several hours. °· To have poor judgment for several hours. °· To vomit if you eat too soon. ° °Follow these instructions at home: °For at least 24 hours after the procedure: ° °· Do not: °? Participate in activities where you could fall or become injured. °? Drive. °? Use heavy machinery. °? Drink alcohol. °? Take sleeping pills or medicines that cause drowsiness. °? Make important decisions or sign legal documents. °? Take care of children on your own. °· Rest. °Eating and drinking °· Follow the diet recommended by your health care provider. °· If you vomit: °? Drink water, juice, or soup when you can drink without vomiting. °? Make sure you have little or no nausea before eating solid foods. °General instructions °·   Have a responsible adult stay with you until you are awake and alert. °· Take over-the-counter and prescription medicines only as told by your health care provider. °· If you smoke, do not smoke without supervision. °· Keep all follow-up visits as told by your health care provider. This is important. °Contact a health care provider if: °· You keep feeling nauseous or you keep vomiting. °· You feel light-headed. °· You develop a rash. °· You have a fever. °Get help right away  if: °· You have trouble breathing. °This information is not intended to replace advice given to you by your health care provider. Make sure you discuss any questions you have with your health care provider. °Document Released: 08/25/2013 Document Revised: 04/08/2016 Document Reviewed: 02/24/2016 °Elsevier Interactive Patient Education © 2018 Elsevier Inc. ° ° °

## 2018-09-18 ENCOUNTER — Encounter: Payer: Self-pay | Admitting: Internal Medicine

## 2018-09-18 DIAGNOSIS — K743 Primary biliary cirrhosis: Secondary | ICD-10-CM | POA: Insufficient documentation

## 2018-09-18 HISTORY — DX: Primary biliary cirrhosis: K74.3

## 2018-09-18 NOTE — Progress Notes (Signed)
Results reviewed with patient Plan for him to let me know when he returns from his trip to [place] and I anticipate starting ursodeoxycholic acid and then also referral to hepatologist to see if this is just PBC or PBC and AIH overlap

## 2018-09-22 ENCOUNTER — Telehealth: Payer: Self-pay

## 2018-09-22 NOTE — Telephone Encounter (Signed)
Patient has been referred to [facility].  He will see Dr. Dorcas Mcmurray on 11/20/18.

## 2018-09-24 ENCOUNTER — Ambulatory Visit (HOSPITAL_COMMUNITY): Payer: Medicare Other

## 2018-09-28 ENCOUNTER — Other Ambulatory Visit: Payer: Self-pay | Admitting: Internal Medicine

## 2018-09-28 MED ORDER — URSODIOL 300 MG PO CAPS: ORAL_CAPSULE | ORAL | 3 refills | Status: DC

## 2018-09-29 ENCOUNTER — Telehealth: Payer: Self-pay

## 2018-09-29 MED ORDER — URSODIOL 500 MG PO TABS: 500.0000 mg | ORAL_TABLET | Freq: Two times a day (BID) | ORAL | 11 refills | Status: DC

## 2018-09-29 NOTE — Telephone Encounter (Signed)
I called [employer] and put a hold on his rx until we decide about his dose. It is on hold until 10/01/18. She said just call them back with instructions.

## 2018-09-29 NOTE — Telephone Encounter (Signed)
Dr Ron Parker said the Hepatologist wants him on a slightly highter dose of the Ursodiol . His office visit is with him 11/20/2018. He wants to know if I can halt the rx sent in to [facility] yesterday and either change the rx to 500mg  , one BID or stay with the 300mg  and take one TID alternating with one QID and just send in more tablets. Please advise Sir and I will call him back.

## 2018-09-29 NOTE — Telephone Encounter (Signed)
I called and told him. He is going to investigate where else he could get it from. He is traveling starting Thursday but still reachable at his mobile #.

## 2018-09-29 NOTE — Telephone Encounter (Signed)
Dr Ron Parker called back and with a good Rx card he can get it for 1/2 price at the [employer] at [facility]. I will send it in now and he only wants a months worth to try. He will call us back with any questions/problems. I have made Dr Carlean Purl aware of this.

## 2018-09-29 NOTE — Telephone Encounter (Signed)
He called back and said not to proceed with [facility] and that he will call me back with the place he decides for me to send it.  I will call [employer] and cancel the order.

## 2018-09-29 NOTE — Telephone Encounter (Signed)
No  You can send him a My Chart message about the cost

## 2018-09-29 NOTE — Telephone Encounter (Signed)
So I called the medicine into [employer] and it's going to be around $450.00 no matter which mg we send in. This is where he's at with his deductible per Santiago Glad at [place]. Any suggestions Sir?

## 2018-09-29 NOTE — Telephone Encounter (Signed)
500 mg bid then # [phone] refills

## 2018-10-02 DIAGNOSIS — K743 Primary biliary cirrhosis: Secondary | ICD-10-CM | POA: Diagnosis not present

## 2018-10-21 ENCOUNTER — Encounter (HOSPITAL_COMMUNITY): Payer: Self-pay | Admitting: Internal Medicine

## 2018-10-27 DIAGNOSIS — D539 Nutritional anemia, unspecified: Secondary | ICD-10-CM

## 2018-10-27 DIAGNOSIS — R7989 Other specified abnormal findings of blood chemistry: Secondary | ICD-10-CM

## 2018-10-27 DIAGNOSIS — K743 Primary biliary cirrhosis: Secondary | ICD-10-CM

## 2018-10-27 DIAGNOSIS — R945 Abnormal results of liver function studies: Secondary | ICD-10-CM

## 2018-10-27 DIAGNOSIS — E785 Hyperlipidemia, unspecified: Secondary | ICD-10-CM

## 2018-10-27 NOTE — Telephone Encounter (Signed)
I placed all of the lab orders.  See his request and questions.

## 2018-11-04 ENCOUNTER — Ambulatory Visit: Payer: Medicare Other

## 2018-11-04 ENCOUNTER — Ambulatory Visit (INDEPENDENT_AMBULATORY_CARE_PROVIDER_SITE_OTHER): Payer: Medicare Other | Admitting: Emergency Medicine

## 2018-11-04 DIAGNOSIS — Z23 Encounter for immunization: Secondary | ICD-10-CM | POA: Diagnosis not present

## 2018-11-13 ENCOUNTER — Other Ambulatory Visit (INDEPENDENT_AMBULATORY_CARE_PROVIDER_SITE_OTHER): Payer: Medicare Other

## 2018-11-13 DIAGNOSIS — R945 Abnormal results of liver function studies: Secondary | ICD-10-CM | POA: Diagnosis not present

## 2018-11-13 DIAGNOSIS — K743 Primary biliary cirrhosis: Secondary | ICD-10-CM

## 2018-11-13 DIAGNOSIS — D539 Nutritional anemia, unspecified: Secondary | ICD-10-CM | POA: Diagnosis not present

## 2018-11-13 DIAGNOSIS — R7989 Other specified abnormal findings of blood chemistry: Secondary | ICD-10-CM

## 2018-11-13 DIAGNOSIS — E785 Hyperlipidemia, unspecified: Secondary | ICD-10-CM | POA: Diagnosis not present

## 2018-11-13 LAB — COMPREHENSIVE METABOLIC PANEL
ALT: 34 U/L (ref 0–53)
AST: 34 U/L (ref 0–37)
Albumin: 4.3 g/dL (ref 3.5–5.2)
Alkaline Phosphatase: 92 U/L (ref 39–117)
BILIRUBIN TOTAL: 0.5 mg/dL (ref 0.2–1.2)
BUN: 18 mg/dL (ref 6–23)
CO2: 30 mEq/L (ref 19–32)
Calcium: 9.7 mg/dL (ref 8.4–10.5)
Chloride: 103 mEq/L (ref 96–112)
Creatinine, Ser: 0.98 mg/dL (ref 0.40–1.50)
GFR: 80.43 mL/min (ref >60.00)
Glucose, Bld: 109 mg/dL — ABNORMAL HIGH (ref 70–99)
Potassium: 4.3 mEq/L (ref 3.5–5.1)
SODIUM: 140 meq/L (ref 135–145)
Total Protein: 7.4 g/dL (ref 6.0–8.3)

## 2018-11-13 LAB — LIPID PANEL
CHOLESTEROL: 253 mg/dL — AB (ref 0–200)
HDL: 50.1 mg/dL (ref >39.00)
LDL Cholesterol: 180 mg/dL — ABNORMAL HIGH (ref 0–99)
NonHDL: 202.97
TRIGLYCERIDES: 117 mg/dL (ref 0.0–149.0)
Total CHOL/HDL Ratio: 5
VLDL: 23.4 mg/dL (ref 0.0–40.0)

## 2018-11-13 LAB — CBC WITH DIFFERENTIAL/PLATELET
Basophils Absolute: 0 10*3/uL (ref 0.0–0.1)
Basophils Relative: 0.3 % (ref 0.0–3.0)
Eosinophils Absolute: 0.2 10*3/uL (ref 0.0–0.7)
Eosinophils Relative: 3.3 % (ref 0.0–5.0)
HCT: 44.8 % (ref 39.0–52.0)
Hemoglobin: 15.2 g/dL (ref 13.0–17.0)
Lymphocytes Relative: 31.7 % (ref 12.0–46.0)
Lymphs Abs: 2 10*3/uL (ref 0.7–4.0)
MCHC: 33.9 g/dL (ref 30.0–36.0)
MCV: 85.2 fl (ref 78.0–100.0)
MONO ABS: 0.5 10*3/uL (ref 0.1–1.0)
Monocytes Relative: 8.7 % (ref 3.0–12.0)
Neutro Abs: 3.5 10*3/uL (ref 1.4–7.7)
Neutrophils Relative %: 56 % (ref 43.0–77.0)
Platelets: 197 10*3/uL (ref 150.0–400.0)
RBC: 5.25 Mil/uL (ref 4.22–5.81)
RDW: 14.9 % (ref 11.5–15.5)
WBC: 6.2 10*3/uL (ref 4.0–10.5)

## 2018-11-13 LAB — PROTIME-INR
INR: 1.1 ratio — AB (ref 0.8–1.0)
Prothrombin Time: 12.3 s (ref 9.6–13.1)

## 2018-11-13 LAB — FERRITIN: Ferritin: 39.9 ng/mL (ref 22.0–322.0)

## 2018-11-17 NOTE — Progress Notes (Signed)
Vitamin A and E ok My Chart release

## 2018-11-19 ENCOUNTER — Telehealth: Payer: Self-pay | Admitting: Internal Medicine

## 2018-11-19 ENCOUNTER — Encounter: Payer: Self-pay | Admitting: Internal Medicine

## 2018-11-19 LAB — QUANTIFERON-TB GOLD PLUS
Mitogen-NIL: 8.81 IU/mL
NIL: 0.03 IU/mL
QuantiFERON-TB Gold Plus: NEGATIVE
TB1-NIL: 0 IU/mL
TB2-NIL: 0 IU/mL

## 2018-11-19 LAB — VITAMIN A: Vitamin A (Retinoic Acid): 52 ug/dL (ref 38–98)

## 2018-11-19 LAB — VITAMIN E
Gamma-Tocopherol (Vit E): <1 mg/L (ref <=4.3)
Vitamin E (Alpha Tocopherol): 14.1 mg/L (ref 5.7–19.9)

## 2018-11-19 LAB — VITAMIN D 1,25 DIHYDROXY
VITAMIN D 1, 25 (OH) TOTAL: 29 pg/mL (ref 18–72)
VITAMIN D3 1, 25 (OH): 29 pg/mL
Vitamin D2 1, 25 (OH)2: <8 pg/mL

## 2018-11-19 LAB — THIOPURINE METHYLTRANSFERASE (TPMT), RBC: THIOPURINE METHYLTRANSFERASE, RBC: 16 nmol/h/mL

## 2018-11-19 NOTE — Telephone Encounter (Signed)
Pt called in with new [facility]  [id number] Member: [id number] eff:11/2018 subscriber: Self  [facility] rx+ [phone] [address] [place]

## 2018-11-19 NOTE — Progress Notes (Signed)
Normal metabolizer

## 2018-11-20 ENCOUNTER — Other Ambulatory Visit: Payer: Self-pay | Admitting: Internal Medicine

## 2018-11-20 DIAGNOSIS — K743 Primary biliary cirrhosis: Secondary | ICD-10-CM | POA: Diagnosis not present

## 2018-11-20 MED ORDER — ROSUVASTATIN CALCIUM 10 MG PO TABS: 10.0000 mg | ORAL_TABLET | Freq: Every day | ORAL | 1 refills | Status: DC

## 2018-11-30 DIAGNOSIS — N138 Other obstructive and reflux uropathy: Secondary | ICD-10-CM | POA: Diagnosis not present

## 2018-11-30 DIAGNOSIS — N281 Cyst of kidney, acquired: Secondary | ICD-10-CM | POA: Diagnosis not present

## 2018-11-30 DIAGNOSIS — N401 Enlarged prostate with lower urinary tract symptoms: Secondary | ICD-10-CM | POA: Diagnosis not present

## 2018-11-30 DIAGNOSIS — N529 Male erectile dysfunction, unspecified: Secondary | ICD-10-CM | POA: Diagnosis not present

## 2018-11-30 MED ORDER — URSODIOL 500 MG PO TABS: 500.0000 mg | ORAL_TABLET | Freq: Two times a day (BID) | ORAL | 1 refills | Status: DC

## 2018-11-30 MED ORDER — ROSUVASTATIN CALCIUM 10 MG PO TABS: 10.0000 mg | ORAL_TABLET | Freq: Every day | ORAL | 0 refills | Status: DC

## 2018-12-01 ENCOUNTER — Other Ambulatory Visit: Payer: Self-pay | Admitting: Internal Medicine

## 2018-12-01 DIAGNOSIS — E785 Hyperlipidemia, unspecified: Secondary | ICD-10-CM

## 2018-12-01 DIAGNOSIS — K743 Primary biliary cirrhosis: Secondary | ICD-10-CM

## 2018-12-02 ENCOUNTER — Telehealth: Payer: Self-pay | Admitting: Internal Medicine

## 2018-12-02 ENCOUNTER — Other Ambulatory Visit: Payer: Self-pay | Admitting: Internal Medicine

## 2018-12-02 MED ORDER — URSODIOL 250 MG PO TABS: 500.0000 mg | ORAL_TABLET | Freq: Two times a day (BID) | ORAL | 3 refills | Status: DC

## 2018-12-02 NOTE — Telephone Encounter (Signed)
I spoke to Dr. Ron Parker and ordered his ursodiol in 250 mg size  I do not know if we need to call and cancel the prior Rx but would probably be helpful to explain that we are changing so it is cheaper

## 2018-12-02 NOTE — Telephone Encounter (Signed)
I have called the mail order pharmacy and deactivated the ursodiol 500mg  rx sent in 11/30/2018.

## 2018-12-21 ENCOUNTER — Other Ambulatory Visit (INDEPENDENT_AMBULATORY_CARE_PROVIDER_SITE_OTHER): Payer: Medicare Other

## 2018-12-21 DIAGNOSIS — E785 Hyperlipidemia, unspecified: Secondary | ICD-10-CM | POA: Diagnosis not present

## 2018-12-21 DIAGNOSIS — K743 Primary biliary cirrhosis: Secondary | ICD-10-CM

## 2018-12-21 LAB — LIPID PANEL
CHOL/HDL RATIO: 3
Cholesterol: 151 mg/dL (ref 0–200)
HDL: 44.7 mg/dL (ref >39.00)
LDL Cholesterol: 89 mg/dL (ref 0–99)
NonHDL: 105.81
Triglycerides: 85 mg/dL (ref 0.0–149.0)
VLDL: 17 mg/dL (ref 0.0–40.0)

## 2018-12-21 LAB — HEPATIC FUNCTION PANEL
ALT: 25 U/L (ref 0–53)
AST: 25 U/L (ref 0–37)
Albumin: 4.2 g/dL (ref 3.5–5.2)
Alkaline Phosphatase: 109 U/L (ref 39–117)
BILIRUBIN DIRECT: 0.2 mg/dL (ref 0.0–0.3)
Total Bilirubin: 0.7 mg/dL (ref 0.2–1.2)
Total Protein: 7.2 g/dL (ref 6.0–8.3)

## 2018-12-21 LAB — GAMMA GT: GGT: 67 U/L — ABNORMAL HIGH (ref 7–51)

## 2018-12-22 NOTE — Progress Notes (Signed)
Labs all ok except mildly elevated GGT Defer to Dr. Manuella Ghazi at [facility] My Chart message

## 2018-12-29 DIAGNOSIS — J324 Chronic pansinusitis: Secondary | ICD-10-CM | POA: Diagnosis not present

## 2019-01-20 DIAGNOSIS — J329 Chronic sinusitis, unspecified: Secondary | ICD-10-CM | POA: Diagnosis not present

## 2019-01-20 DIAGNOSIS — J3089 Other allergic rhinitis: Secondary | ICD-10-CM | POA: Diagnosis not present

## 2019-01-21 DIAGNOSIS — J324 Chronic pansinusitis: Secondary | ICD-10-CM | POA: Diagnosis not present

## 2019-02-07 ENCOUNTER — Encounter: Payer: Self-pay | Admitting: Internal Medicine

## 2019-02-08 ENCOUNTER — Other Ambulatory Visit: Payer: Self-pay | Admitting: Internal Medicine

## 2019-02-08 DIAGNOSIS — E785 Hyperlipidemia, unspecified: Secondary | ICD-10-CM

## 2019-02-08 DIAGNOSIS — R4681 Obsessive-compulsive behavior: Secondary | ICD-10-CM

## 2019-02-08 DIAGNOSIS — E034 Atrophy of thyroid (acquired): Secondary | ICD-10-CM

## 2019-02-08 MED ORDER — SERTRALINE HCL 50 MG PO TABS: 75.0000 mg | ORAL_TABLET | ORAL | 1 refills | Status: DC

## 2019-02-08 MED ORDER — LEVOTHYROXINE SODIUM 112 MCG PO TABS: 112.0000 ug | ORAL_TABLET | Freq: Every day | ORAL | 1 refills | Status: DC

## 2019-02-08 MED ORDER — SERTRALINE HCL 50 MG PO TABS: 50.0000 mg | ORAL_TABLET | ORAL | 1 refills | Status: DC

## 2019-02-08 MED ORDER — ROSUVASTATIN CALCIUM 10 MG PO TABS: 10.0000 mg | ORAL_TABLET | Freq: Every day | ORAL | 0 refills | Status: DC

## 2019-02-09 DIAGNOSIS — H903 Sensorineural hearing loss, bilateral: Secondary | ICD-10-CM | POA: Diagnosis not present

## 2019-04-27 ENCOUNTER — Other Ambulatory Visit: Payer: Self-pay | Admitting: Internal Medicine

## 2019-04-27 DIAGNOSIS — E785 Hyperlipidemia, unspecified: Secondary | ICD-10-CM

## 2019-06-18 ENCOUNTER — Other Ambulatory Visit: Payer: Self-pay

## 2019-07-14 ENCOUNTER — Encounter: Payer: Self-pay | Admitting: Internal Medicine

## 2019-07-19 ENCOUNTER — Other Ambulatory Visit: Payer: Self-pay

## 2019-07-19 ENCOUNTER — Other Ambulatory Visit (INDEPENDENT_AMBULATORY_CARE_PROVIDER_SITE_OTHER): Payer: Medicare Other

## 2019-07-19 ENCOUNTER — Encounter: Payer: Self-pay | Admitting: Internal Medicine

## 2019-07-19 ENCOUNTER — Ambulatory Visit (INDEPENDENT_AMBULATORY_CARE_PROVIDER_SITE_OTHER): Payer: Medicare Other | Admitting: Internal Medicine

## 2019-07-19 VITALS — BP 124/62 | HR 67 | Temp 97.8°F | Resp 16 | Ht 68.0 in | Wt 164.0 lb

## 2019-07-19 DIAGNOSIS — E785 Hyperlipidemia, unspecified: Secondary | ICD-10-CM | POA: Diagnosis not present

## 2019-07-19 DIAGNOSIS — R7303 Prediabetes: Secondary | ICD-10-CM

## 2019-07-19 DIAGNOSIS — F5105 Insomnia due to other mental disorder: Secondary | ICD-10-CM | POA: Diagnosis not present

## 2019-07-19 DIAGNOSIS — N400 Enlarged prostate without lower urinary tract symptoms: Secondary | ICD-10-CM | POA: Diagnosis not present

## 2019-07-19 DIAGNOSIS — Z23 Encounter for immunization: Secondary | ICD-10-CM

## 2019-07-19 DIAGNOSIS — E034 Atrophy of thyroid (acquired): Secondary | ICD-10-CM

## 2019-07-19 DIAGNOSIS — F409 Phobic anxiety disorder, unspecified: Secondary | ICD-10-CM | POA: Diagnosis not present

## 2019-07-19 DIAGNOSIS — D500 Iron deficiency anemia secondary to blood loss (chronic): Secondary | ICD-10-CM

## 2019-07-19 DIAGNOSIS — K743 Primary biliary cirrhosis: Secondary | ICD-10-CM

## 2019-07-19 DIAGNOSIS — R4681 Obsessive-compulsive behavior: Secondary | ICD-10-CM

## 2019-07-19 LAB — CBC WITH DIFFERENTIAL/PLATELET
Basophils Absolute: 0 10*3/uL (ref 0.0–0.1)
Basophils Relative: 0.3 % (ref 0.0–3.0)
Eosinophils Absolute: 0.1 10*3/uL (ref 0.0–0.7)
Eosinophils Relative: 1.4 % (ref 0.0–5.0)
HCT: 42.6 % (ref 39.0–52.0)
Hemoglobin: 14.3 g/dL (ref 13.0–17.0)
Lymphocytes Relative: 28.5 % (ref 12.0–46.0)
Lymphs Abs: 2.1 10*3/uL (ref 0.7–4.0)
MCHC: 33.5 g/dL (ref 30.0–36.0)
MCV: 88.3 fl (ref 78.0–100.0)
Monocytes Absolute: 0.7 10*3/uL (ref 0.1–1.0)
Monocytes Relative: 9.1 % (ref 3.0–12.0)
Neutro Abs: 4.5 10*3/uL (ref 1.4–7.7)
Neutrophils Relative %: 60.7 % (ref 43.0–77.0)
Platelets: 199 10*3/uL (ref 150.0–400.0)
RBC: 4.82 Mil/uL (ref 4.22–5.81)
RDW: 13.1 % (ref 11.5–15.5)
WBC: 7.5 10*3/uL (ref 4.0–10.5)

## 2019-07-19 LAB — LIPID PANEL
Cholesterol: 153 mg/dL (ref 0–200)
HDL: 52.2 mg/dL (ref >39.00)
LDL Cholesterol: 82 mg/dL (ref 0–99)
NonHDL: 101.24
Total CHOL/HDL Ratio: 3
Triglycerides: 96 mg/dL (ref 0.0–149.0)
VLDL: 19.2 mg/dL (ref 0.0–40.0)

## 2019-07-19 LAB — HEPATIC FUNCTION PANEL
ALT: 39 U/L (ref 0–53)
AST: 48 U/L — ABNORMAL HIGH (ref 0–37)
Albumin: 4.3 g/dL (ref 3.5–5.2)
Alkaline Phosphatase: 94 U/L (ref 39–117)
Bilirubin, Direct: 0.2 mg/dL (ref 0.0–0.3)
Total Bilirubin: 0.7 mg/dL (ref 0.2–1.2)
Total Protein: 7.1 g/dL (ref 6.0–8.3)

## 2019-07-19 LAB — BASIC METABOLIC PANEL
BUN: 18 mg/dL (ref 6–23)
CO2: 28 mEq/L (ref 19–32)
Calcium: 9.9 mg/dL (ref 8.4–10.5)
Chloride: 103 mEq/L (ref 96–112)
Creatinine, Ser: 0.82 mg/dL (ref 0.40–1.50)
GFR: 92.78 mL/min (ref >60.00)
Glucose, Bld: 78 mg/dL (ref 70–99)
Potassium: 4.2 mEq/L (ref 3.5–5.1)
Sodium: 139 mEq/L (ref 135–145)

## 2019-07-19 LAB — HEMOGLOBIN A1C: Hgb A1c MFr Bld: 6 % (ref 4.6–6.5)

## 2019-07-19 LAB — IBC PANEL
Iron: 96 ug/dL (ref 42–165)
Saturation Ratios: 26.5 % (ref 20.0–50.0)
Transferrin: 259 mg/dL (ref 212.0–360.0)

## 2019-07-19 LAB — TSH: TSH: 1.17 u[IU]/mL (ref 0.35–4.50)

## 2019-07-19 LAB — GAMMA GT: GGT: 47 U/L (ref 7–51)

## 2019-07-19 LAB — PSA: PSA: 0.88 ng/mL (ref 0.10–4.00)

## 2019-07-19 LAB — FERRITIN: Ferritin: 84.9 ng/mL (ref 22.0–322.0)

## 2019-07-19 MED ORDER — SERTRALINE HCL 50 MG PO TABS: 75.0000 mg | ORAL_TABLET | ORAL | 1 refills | Status: DC

## 2019-07-19 MED ORDER — ROSUVASTATIN CALCIUM 10 MG PO TABS: 10.0000 mg | ORAL_TABLET | Freq: Every day | ORAL | 1 refills | Status: DC

## 2019-07-19 MED ORDER — ZALEPLON 10 MG PO CAPS: 10.0000 mg | ORAL_CAPSULE | Freq: Every evening | ORAL | 1 refills | Status: DC | PRN

## 2019-07-19 MED ORDER — LEVOTHYROXINE SODIUM 112 MCG PO TABS: 112.0000 ug | ORAL_TABLET | Freq: Every day | ORAL | 1 refills | Status: DC

## 2019-07-19 NOTE — Patient Instructions (Signed)
Hypothyroidism  Hypothyroidism is when the thyroid gland does not make enough of certain hormones (it is underactive). The thyroid gland is a small gland located in the lower front part of the neck, just in front of the windpipe (trachea). This gland makes hormones that help control how the body uses food for energy (metabolism) as well as how the heart and brain function. These hormones also play a role in keeping your bones strong. When the thyroid is underactive, it produces too little of the hormones thyroxine (T4) and triiodothyronine (T3). What are the causes? This condition may be caused by:  Hashimoto's disease. This is a disease in which the body's disease-fighting system (immune system) attacks the thyroid gland. This is the most common cause.  Viral infections.  Pregnancy.  Certain medicines.  Birth defects.  Past radiation treatments to the head or neck for cancer.  Past treatment with radioactive iodine.  Past exposure to radiation in the environment.  Past surgical removal of part or all of the thyroid.  Problems with a gland in the center of the brain (pituitary gland).  Lack of enough iodine in the diet. What increases the risk? You are more likely to develop this condition if:  You are male.  You have a family history of thyroid conditions.  You use a medicine called lithium.  You take medicines that affect the immune system (immunosuppressants). What are the signs or symptoms? Symptoms of this condition include:  Feeling as though you have no energy (lethargy).  Not being able to tolerate cold.  Weight gain that is not explained by a change in diet or exercise habits.  Lack of appetite.  Dry skin.  Coarse hair.  Menstrual irregularity.  Slowing of thought processes.  Constipation.  Sadness or depression. How is this diagnosed? This condition may be diagnosed based on:  Your symptoms, your medical history, and a physical exam.  Blood  tests. You may also have imaging tests, such as an ultrasound or MRI. How is this treated? This condition is treated with medicine that replaces the thyroid hormones that your body does not make. After you begin treatment, it may take several weeks for symptoms to go away. Follow these instructions at home:  Take over-the-counter and prescription medicines only as told by your health care provider.  If you start taking any new medicines, tell your health care provider.  Keep all follow-up visits as told by your health care provider. This is important. ? As your condition improves, your dosage of thyroid hormone medicine may change. ? You will need to have blood tests regularly so that your health care provider can monitor your condition. Contact a health care provider if:  Your symptoms do not get better with treatment.  You are taking thyroid replacement medicine and you: ? Sweat a lot. ? Have tremors. ? Feel anxious. ? Lose weight rapidly. ? Cannot tolerate heat. ? Have emotional swings. ? Have diarrhea. ? Feel weak. Get help right away if you have:  Chest pain.  An irregular heartbeat.  A rapid heartbeat.  Difficulty breathing. Summary  Hypothyroidism is when the thyroid gland does not make enough of certain hormones (it is underactive).  When the thyroid is underactive, it produces too little of the hormones thyroxine (T4) and triiodothyronine (T3).  The most common cause is Hashimoto's disease, a disease in which the body's disease-fighting system (immune system) attacks the thyroid gland. The condition can also be caused by viral infections, medicine, pregnancy, or past   radiation treatment to the head or neck.  Symptoms may include weight gain, dry skin, constipation, feeling as though you do not have energy, and not being able to tolerate cold.  This condition is treated with medicine to replace the thyroid hormones that your body does not make. This information  is not intended to replace advice given to you by your health care provider. Make sure you discuss any questions you have with your health care provider. Document Released: 11/04/2005 Document Revised: 10/17/2017 Document Reviewed: 10/15/2017 Elsevier Patient Education  2020 Elsevier Inc.  

## 2019-07-19 NOTE — Progress Notes (Signed)
Subjective:  Patient ID: William Bjornstad, MD, male    DOB: Apr 21, 1951  Age: 67 y.o. MRN: [id number]  CC: Hyperlipidemia and Hypothyroidism   HPI William Bjornstad, MD presents for f/up - He is very active and denies any recent episodes of CP or DOE.  He tells me he saw his urologist about 6 months ago and had a digital rectal exam performed.  He tells me a PSA was not ordered.  He is being treated for primary biliary cholangitis by a specialist at [facility].  He continues to take Sonata intermittently for insomnia.  Outpatient Medications Prior to Visit  Medication Sig Dispense Refill  . ursodiol (ACTIGALL) 250 MG tablet Take 2 tablets (500 mg total) by mouth 2 (two) times daily. 360 tablet 3  . levothyroxine (SYNTHROID, LEVOTHROID) 112 MCG tablet Take 1 tablet (112 mcg total) by mouth daily. 90 tablet 1  . rosuvastatin (CRESTOR) 10 MG tablet Take 1 tablet by mouth every day 90 tablet 0  . sertraline (ZOLOFT) 50 MG tablet Take 1.5 tablets (75 mg total) by mouth See admin instructions. 50 mg (1 tab) by mouth every other day alternating with 75 mg (1.5 tabs) by mouth every other day 180 tablet 1  . zaleplon (SONATA) 10 MG capsule Take 1 capsule (10 mg total) by mouth at bedtime as needed for sleep. 50 capsule 3  . aspirin EC 81 MG tablet Take 81 mg by mouth. Patient takes Monday and Thursday     No facility-administered medications prior to visit.     ROS Review of Systems  Constitutional: Negative for diaphoresis, fatigue and unexpected weight change.  HENT: Negative.   Eyes: Negative.   Respiratory: Negative for cough, chest tightness, shortness of breath and wheezing.   Cardiovascular: Negative for chest pain, palpitations and leg swelling.  Gastrointestinal: Negative for abdominal pain, blood in stool, diarrhea, nausea and vomiting.  Endocrine: Negative.  Negative for cold intolerance and heat intolerance.  Genitourinary: Negative.  Negative for difficulty urinating and dysuria.   Musculoskeletal: Negative.  Negative for myalgias.  Skin: Negative.  Negative for color change and pallor.  Neurological: Negative.  Negative for dizziness, weakness, light-headedness and headaches.  Hematological: Negative for adenopathy. Does not bruise/bleed easily.  Psychiatric/Behavioral: Positive for sleep disturbance. Negative for decreased concentration, dysphoric mood, self-injury and suicidal ideas. The patient is nervous/anxious.     Objective:  BP 124/62 (BP Location: Left Arm, Patient Position: Sitting, Cuff Size: Normal)   Pulse 67   Temp 97.8 F (36.6 C) (Oral)   Resp 16   Ht 5\' 8"  (1.727 m)   Wt 164 lb (74.4 kg)   SpO2 98%   BMI 24.94 kg/m   BP Readings from Last 3 Encounters:  07/19/19 124/62  09/17/18 109/69  09/07/18 105/60    Wt Readings from Last 3 Encounters:  07/19/19 164 lb (74.4 kg)  09/17/18 150 lb (68 kg)  09/07/18 159 lb (72.1 kg)    Physical Exam Vitals signs reviewed.  Constitutional:      Appearance: Normal appearance.  HENT:     Nose: Nose normal.     Mouth/Throat:     Mouth: Mucous membranes are moist.  Eyes:     General: No scleral icterus.    Conjunctiva/sclera: Conjunctivae normal.  Neck:     Musculoskeletal: Normal range of motion. No neck rigidity or muscular tenderness.  Cardiovascular:     Rate and Rhythm: Normal rate and regular rhythm.     Heart  sounds: No murmur.  Pulmonary:     Effort: Pulmonary effort is normal. No respiratory distress.     Breath sounds: No stridor. No wheezing, rhonchi or rales.  Abdominal:     General: Abdomen is flat. Bowel sounds are normal. There is no distension.     Palpations: Abdomen is soft. There is no hepatomegaly, splenomegaly or mass.     Tenderness: There is no abdominal tenderness. There is no guarding.     Hernia: No hernia is present.  Genitourinary:    Comments: GU and rectal exams were deferred at his request since his urologist did this about 6 months ago. Musculoskeletal:  Normal range of motion.     Right lower leg: No edema.     Left lower leg: No edema.  Lymphadenopathy:     Cervical: No cervical adenopathy.  Skin:    General: Skin is warm and dry.  Neurological:     General: No focal deficit present.     Mental Status: He is alert.  Psychiatric:        Mood and Affect: Mood normal.        Behavior: Behavior normal.        Thought Content: Thought content normal.        Judgment: Judgment normal.     Lab Results  Component Value Date   WBC 7.5 07/19/2019   HGB 14.3 07/19/2019   HCT 42.6 07/19/2019   PLT 199.0 07/19/2019   GLUCOSE 78 07/19/2019   CHOL 153 07/19/2019   TRIG 96.0 07/19/2019   HDL 52.20 07/19/2019   LDLDIRECT 134.5 12/15/2006   LDLCALC 82 07/19/2019   ALT 39 07/19/2019   AST 48 (H) 07/19/2019   NA 139 07/19/2019   K 4.2 07/19/2019   CL 103 07/19/2019   CREATININE 0.82 07/19/2019   BUN 18 07/19/2019   CO2 28 07/19/2019   TSH 1.17 07/19/2019   PSA 0.88 07/19/2019   INR 1.1 (H) 11/13/2018   HGBA1C 6.0 07/19/2019    US Biopsy (liver)  Result Date: 09/17/2018 CLINICAL DATA:  Abnormal liver function tests and positive antinuclear antibody, anti smooth muscle antibody and elevated mitochondrial antibody. EXAM: ULTRASOUND GUIDED CORE BIOPSY OF LIVER MEDICATIONS: 1.5 mg IV Versed; 75 mcg IV Fentanyl Total Moderate Sedation Time: 19 minutes. The patient's level of consciousness and physiologic status were continuously monitored during the procedure by Radiology nursing. PROCEDURE: The procedure, risks, benefits, and alternatives were explained to the patient. Questions regarding the procedure were encouraged and answered. The patient understands and consents to the procedure. A time out was performed prior to initiating the procedure. Ultrasound was performed to localize the liver. The right abdominal wall was prepped with chlorhexidine in a sterile fashion, and a sterile drape was applied covering the operative field. A sterile  gown and sterile gloves were used for the procedure. Local anesthesia was provided with 1% Lidocaine. Under ultrasound guidance, a 17 gauge trocar needle was advanced into the right lobe of the liver. Three separate coaxial 18 gauge core biopsy samples were obtained. Material was submitted in formalin. Gel-Foam pledgets were advanced through the outer needle as it was retracted. Additional ultrasound imaging was performed. COMPLICATIONS: None. FINDINGS: Solid core biopsy samples were obtained of the liver. There were no immediate complications. IMPRESSION: Ultrasound-guided core biopsy performed of the right lobe of the liver. Electronically Signed   By: Aletta Edouard M.D.   On: 09/17/2018 09:58    Assessment & Plan:   William Luna was seen  today for hyperlipidemia and hypothyroidism.  Diagnoses and all orders for this visit:  Need for influenza vaccination -     Flu Vaccine QUAD High Dose(Fluad)  Primary biliary cholangitis ([place])- His LFTs remain mildly elevated but they are stable over time.  He will continue follow-up with the liver specialist and will continue the current dose of ursodiol. -     Gamma GT; Future -     Hepatic function panel; Future  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 atrophy of thyroid- His TSH is in the normal range.  He will remain on the current dose of levothyroxine. -     TSH; Future -     levothyroxine (SYNTHROID) 112 MCG tablet; Take 1 tablet (112 mcg total) by mouth daily.  Benign prostatic hyperplasia without lower urinary tract symptoms- His PSA is low.  This is reassuring that he does not have prostate cancer. -     PSA; Future  Hyperlipidemia LDL goal <130- He has achieved his LDL goal is doing well on the statin. -     Lipid panel; Future -     rosuvastatin (CRESTOR) 10 MG tablet; Take 1 tablet (10 mg total) by mouth daily.  Prediabetes- His A1c is at 6.0%.  Medical therapy is not indicated. -     Basic metabolic panel; Future -     Hemoglobin A1c; Future   Iron deficiency anemia due to chronic blood loss- His H&H and iron levels are normal now. -     IBC panel; Future -     CBC with Differential/Platelet; Future -     Ferritin; Future  Obsessive behaviors -     sertraline (ZOLOFT) 50 MG tablet; Take 1.5 tablets (75 mg total) by mouth See admin instructions. 50 mg (1 tab) by mouth every other day alternating with 75 mg (1.5 tabs) by mouth every other day  Insomnia due to anxiety and fear -     zaleplon (SONATA) 10 MG capsule; Take 1 capsule (10 mg total) by mouth at bedtime as needed for sleep.   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 I have also changed his rosuvastatin and levothyroxine. Additionally, I am having him maintain his ursodiol, sertraline, and zaleplon.  Meds ordered this encounter  Medications  . rosuvastatin (CRESTOR) 10 MG tablet    Sig: Take 1 tablet (10 mg total) by mouth daily.    Dispense:  90 tablet    Refill:  1    Refill [id number]  . sertraline (ZOLOFT) 50 MG tablet    Sig: Take 1.5 tablets (75 mg total) by mouth See admin instructions. 50 mg (1 tab) by mouth every other day alternating with 75 mg (1.5 tabs) by mouth every other day    Dispense:  180 tablet    Refill:  1  . zaleplon (SONATA) 10 MG capsule    Sig: Take 1 capsule (10 mg total) by mouth at bedtime as needed for sleep.    Dispense:  50 capsule    Refill:  1  . levothyroxine (SYNTHROID) 112 MCG tablet    Sig: Take 1 tablet (112 mcg total) by mouth daily.    Dispense:  90 tablet    Refill:  1     Follow-up: Return in about 6 months (around 01/16/2020).  Scarlette Calico, MD

## 2019-07-20 ENCOUNTER — Telehealth: Payer: Self-pay | Admitting: Internal Medicine

## 2019-07-20 NOTE — Telephone Encounter (Signed)
zaleplon (SONATA) 10 MG capsule    Orvil Feil, with [facility], calling to request clarification on this medication. Specifically the intended supply.

## 2019-07-20 NOTE — Telephone Encounter (Signed)
Called pt to clarify the number of capsule. Pt stated that 50 capsules for 90 day supply is correct.   Called [facility] - left detailed message for [facility] with the above medication.

## 2019-08-05 ENCOUNTER — Other Ambulatory Visit: Payer: Self-pay | Admitting: Internal Medicine

## 2019-08-05 ENCOUNTER — Encounter: Payer: Self-pay | Admitting: Internal Medicine

## 2019-08-05 DIAGNOSIS — F409 Phobic anxiety disorder, unspecified: Secondary | ICD-10-CM

## 2019-08-05 DIAGNOSIS — F5105 Insomnia due to other mental disorder: Secondary | ICD-10-CM

## 2019-08-05 MED ORDER — ZALEPLON 10 MG PO CAPS: 10.0000 mg | ORAL_CAPSULE | Freq: Every evening | ORAL | 1 refills | Status: DC | PRN

## 2019-08-09 DIAGNOSIS — E785 Hyperlipidemia, unspecified: Secondary | ICD-10-CM | POA: Diagnosis not present

## 2019-08-09 DIAGNOSIS — K743 Primary biliary cirrhosis: Secondary | ICD-10-CM | POA: Diagnosis not present

## 2019-09-28 ENCOUNTER — Other Ambulatory Visit: Payer: Self-pay | Admitting: Internal Medicine

## 2019-09-28 ENCOUNTER — Encounter: Payer: Self-pay | Admitting: Internal Medicine

## 2019-09-28 DIAGNOSIS — K743 Primary biliary cirrhosis: Secondary | ICD-10-CM

## 2019-09-28 MED ORDER — URSODIOL 250 MG PO TABS: 500.0000 mg | ORAL_TABLET | Freq: Two times a day (BID) | ORAL | 1 refills | Status: DC

## 2019-09-30 ENCOUNTER — Other Ambulatory Visit: Payer: Self-pay | Admitting: Internal Medicine

## 2019-09-30 DIAGNOSIS — R4681 Obsessive-compulsive behavior: Secondary | ICD-10-CM

## 2019-10-13 ENCOUNTER — Other Ambulatory Visit: Payer: Self-pay | Admitting: Internal Medicine

## 2019-10-13 DIAGNOSIS — E785 Hyperlipidemia, unspecified: Secondary | ICD-10-CM

## 2019-10-13 DIAGNOSIS — E034 Atrophy of thyroid (acquired): Secondary | ICD-10-CM

## 2019-11-29 ENCOUNTER — Encounter: Payer: Self-pay | Admitting: *Deleted

## 2019-11-29 ENCOUNTER — Telehealth: Payer: Self-pay | Admitting: Internal Medicine

## 2019-11-29 ENCOUNTER — Other Ambulatory Visit: Payer: Self-pay | Admitting: *Deleted

## 2019-11-29 DIAGNOSIS — K743 Primary biliary cirrhosis: Secondary | ICD-10-CM

## 2019-11-29 NOTE — Telephone Encounter (Signed)
Dr. Carlean Purl, please advise. Patient has not been seen in the office since 09/02/2018. Requesting labs for his virtual visit he has with Dr. Pauline Good. "Dr. Carlean Purl is aware." Please advise.

## 2019-11-29 NOTE — Telephone Encounter (Signed)
Please order the following labs:  CBC w/ diff INR CMET Vitamin A level Vitamin E level 25 OH vitamin D level GGT   Dx primary biliary cholangitis

## 2019-11-29 NOTE — Telephone Encounter (Signed)
Patient aware. Will be in for labs on Thursday.

## 2019-12-01 ENCOUNTER — Other Ambulatory Visit: Payer: Self-pay | Admitting: Internal Medicine

## 2019-12-01 ENCOUNTER — Other Ambulatory Visit (INDEPENDENT_AMBULATORY_CARE_PROVIDER_SITE_OTHER): Payer: Medicare Other

## 2019-12-01 DIAGNOSIS — K743 Primary biliary cirrhosis: Secondary | ICD-10-CM

## 2019-12-01 DIAGNOSIS — E559 Vitamin D deficiency, unspecified: Secondary | ICD-10-CM

## 2019-12-01 LAB — CBC WITH DIFFERENTIAL/PLATELET
Basophils Absolute: 0 10*3/uL (ref 0.0–0.1)
Basophils Relative: 0.3 % (ref 0.0–3.0)
Eosinophils Absolute: 0.1 10*3/uL (ref 0.0–0.7)
Eosinophils Relative: 2.2 % (ref 0.0–5.0)
HCT: 43.7 % (ref 39.0–52.0)
Hemoglobin: 14.5 g/dL (ref 13.0–17.0)
Lymphocytes Relative: 21.4 % (ref 12.0–46.0)
Lymphs Abs: 1.4 10*3/uL (ref 0.7–4.0)
MCHC: 33.3 g/dL (ref 30.0–36.0)
MCV: 87.5 fl (ref 78.0–100.0)
Monocytes Absolute: 0.6 10*3/uL (ref 0.1–1.0)
Monocytes Relative: 8.6 % (ref 3.0–12.0)
Neutro Abs: 4.6 10*3/uL (ref 1.4–7.7)
Neutrophils Relative %: 67.5 % (ref 43.0–77.0)
Platelets: 193 10*3/uL (ref 150.0–400.0)
RBC: 4.99 Mil/uL (ref 4.22–5.81)
RDW: 13.4 % (ref 11.5–15.5)
WBC: 6.8 10*3/uL (ref 4.0–10.5)

## 2019-12-01 LAB — COMPREHENSIVE METABOLIC PANEL
ALT: 41 U/L (ref 0–53)
AST: 40 U/L — ABNORMAL HIGH (ref 0–37)
Albumin: 4.4 g/dL (ref 3.5–5.2)
Alkaline Phosphatase: 93 U/L (ref 39–117)
BUN: 19 mg/dL (ref 6–23)
CO2: 28 mEq/L (ref 19–32)
Calcium: 9.5 mg/dL (ref 8.4–10.5)
Chloride: 103 mEq/L (ref 96–112)
Creatinine, Ser: 0.84 mg/dL (ref 0.40–1.50)
GFR: 90.14 mL/min (ref >60.00)
Glucose, Bld: 103 mg/dL — ABNORMAL HIGH (ref 70–99)
Potassium: 4.5 mEq/L (ref 3.5–5.1)
Sodium: 138 mEq/L (ref 135–145)
Total Bilirubin: 0.5 mg/dL (ref 0.2–1.2)
Total Protein: 7.6 g/dL (ref 6.0–8.3)

## 2019-12-01 LAB — PROTIME-INR
INR: 1.1 ratio — ABNORMAL HIGH (ref 0.8–1.0)
Prothrombin Time: 12.6 s (ref 9.6–13.1)

## 2019-12-01 LAB — GAMMA GT: GGT: 59 U/L — ABNORMAL HIGH (ref 7–51)

## 2019-12-01 LAB — VITAMIN D 25 HYDROXY (VIT D DEFICIENCY, FRACTURES): VITD: 42.25 ng/mL (ref 30.00–100.00)

## 2019-12-03 LAB — VITAMIN A: Vitamin A (Retinoic Acid): 56 ug/dL (ref 38–98)

## 2019-12-03 LAB — VITAMIN E
Gamma-Tocopherol (Vit E): 1 mg/L (ref <=4.3)
Vitamin E (Alpha Tocopherol): 9.9 mg/L (ref 5.7–19.9)

## 2019-12-09 ENCOUNTER — Other Ambulatory Visit: Payer: Self-pay | Admitting: *Deleted

## 2019-12-09 ENCOUNTER — Telehealth: Payer: Self-pay | Admitting: Internal Medicine

## 2019-12-09 DIAGNOSIS — K743 Primary biliary cirrhosis: Secondary | ICD-10-CM | POA: Diagnosis not present

## 2019-12-09 DIAGNOSIS — E785 Hyperlipidemia, unspecified: Secondary | ICD-10-CM | POA: Diagnosis not present

## 2019-12-09 DIAGNOSIS — Z9189 Other specified personal risk factors, not elsewhere classified: Secondary | ICD-10-CM

## 2019-12-09 NOTE — Telephone Encounter (Signed)
Printed ABN, taken to [place] at the front desk for the patient whom I spoke with who will be in today to sign. After signing the ABN, xray will be called for scheduling.

## 2019-12-09 NOTE — Telephone Encounter (Signed)
His hepatologist has recommended a DEXA scan   Please order one and use the at high risk for osteoporosis dx  Thanks

## 2019-12-09 NOTE — Telephone Encounter (Signed)
I did explain that it might not be covered and he is ok with that but did not tell him he would need to sign the ABN so please let him know - he will not be surprised re: coverage

## 2019-12-09 NOTE — Telephone Encounter (Signed)
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 This patient would come in and need to sign the ABN prior to being scheduled for the DXA scan. The patient has not currently been contacted about this as of yet. Please advise.

## 2019-12-10 ENCOUNTER — Telehealth: Payer: Self-pay | Admitting: *Deleted

## 2019-12-10 NOTE — Telephone Encounter (Signed)
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 When this was told to the patient he reported that he wanted to appeal with Medicare since it appears that this imaging study may not be covered. The patient reported he would call back after his appeal to schedule.

## 2019-12-13 ENCOUNTER — Telehealth: Payer: Self-pay | Admitting: Internal Medicine

## 2019-12-13 NOTE — Telephone Encounter (Signed)
Patient was told by Medicare he could appeal coverage for his DEXA scan. He originally said he would pay out of pocket but understandably decided to check further.  I am not certain that they will cover it but willing to try to help.   Please let me know if I can write a letter to justify it or something else.  There is a provider line [phone]  He has primary biliary cholangitis and is at high-risk for osteoporosis   Please see what you can find out  I am ccing Amy Hazelwood to see if she can help  So you guys can work together on this

## 2019-12-13 NOTE — Telephone Encounter (Signed)
I called Medicare number you gave and got no where. They need EDI numbers and that is something that we do not have because we don't do billing.   I would suggest him get in touch with where it was scheduled and get them to help with this.  Anywhere we send patients for imaging, they are the ones quoting the benefits and coverage to the patient before they have a study done.  I do know that if there was something denied from an insurance company, there is always an appeal option for patients and sometimes they ask a physician to help out sending a letter to get approved.  Sorry I could not help more.

## 2019-12-14 NOTE — Telephone Encounter (Signed)
Noted  

## 2019-12-14 NOTE — Telephone Encounter (Signed)
I texted Dr. Ron Parker and explained that I do not think we can set this upso it will be covered and that an appeal is typically after the fact.  I have asked him to check with Dr. Manuella Ghazi, his hepatologist and the doctor that has recommended to see what he should do.  Thanks

## 2019-12-14 NOTE — Telephone Encounter (Signed)
Thank you Dr Gessner!   

## 2019-12-15 ENCOUNTER — Other Ambulatory Visit: Payer: Medicare Other

## 2019-12-25 ENCOUNTER — Encounter: Payer: Self-pay | Admitting: Internal Medicine

## 2019-12-27 ENCOUNTER — Other Ambulatory Visit: Payer: Self-pay | Admitting: Internal Medicine

## 2019-12-27 DIAGNOSIS — R4681 Obsessive-compulsive behavior: Secondary | ICD-10-CM

## 2019-12-27 MED ORDER — SERTRALINE HCL 50 MG PO TABS: 75.0000 mg | ORAL_TABLET | Freq: Every day | ORAL | 1 refills | Status: DC

## 2020-01-04 ENCOUNTER — Ambulatory Visit: Payer: Medicare Other | Admitting: Internal Medicine

## 2020-01-19 LAB — PSA: PSA: 0.7

## 2020-02-08 ENCOUNTER — Other Ambulatory Visit: Payer: Self-pay | Admitting: Internal Medicine

## 2020-02-08 DIAGNOSIS — K743 Primary biliary cirrhosis: Secondary | ICD-10-CM

## 2020-02-09 ENCOUNTER — Encounter: Payer: Self-pay | Admitting: Infectious Diseases

## 2020-02-09 LAB — CBC AND DIFFERENTIAL
HCT: 44 (ref 41–53)
Hemoglobin: 15 (ref 13.5–17.5)
Neutrophils Absolute: 3
Platelets: 198 (ref 150–399)
WBC: 6.4

## 2020-02-09 LAB — LIPID PANEL
Cholesterol: 165 (ref 0–200)
HDL: 48 (ref 35–70)
LDL Cholesterol: 94
Triglycerides: 127 (ref 40–160)

## 2020-02-09 LAB — BASIC METABOLIC PANEL
BUN: 21 (ref 4–21)
CO2: 26 — AB (ref 13–22)
Chloride: 101 (ref 99–108)
Creatinine: 1 (ref 0.6–1.3)
Glucose: 90
Potassium: 4.6 (ref 3.4–5.3)
Sodium: 139 (ref 137–147)

## 2020-02-09 LAB — COMPREHENSIVE METABOLIC PANEL
Albumin: 4.4 (ref 3.5–5.0)
Calcium: 9.5 (ref 8.7–10.7)
GFR calc non Af Amer: 80
Globulin: 2.7

## 2020-02-09 LAB — HEPATIC FUNCTION PANEL
ALT: 41 — AB (ref 10–40)
AST: 54 — AB (ref 14–40)
Alkaline Phosphatase: 110 (ref 25–125)
Bilirubin, Total: 0.4

## 2020-02-09 LAB — TSH: TSH: 3.37 (ref 0.41–5.90)

## 2020-02-09 LAB — HEMOGLOBIN A1C: Hemoglobin A1C: 5.9

## 2020-02-09 LAB — CBC: RBC: 5.08 (ref 3.87–5.11)

## 2020-02-21 DIAGNOSIS — N138 Other obstructive and reflux uropathy: Secondary | ICD-10-CM | POA: Diagnosis not present

## 2020-02-21 DIAGNOSIS — N529 Male erectile dysfunction, unspecified: Secondary | ICD-10-CM | POA: Diagnosis not present

## 2020-02-21 DIAGNOSIS — N281 Cyst of kidney, acquired: Secondary | ICD-10-CM | POA: Diagnosis not present

## 2020-02-21 DIAGNOSIS — N411 Chronic prostatitis: Secondary | ICD-10-CM | POA: Diagnosis not present

## 2020-02-21 DIAGNOSIS — N401 Enlarged prostate with lower urinary tract symptoms: Secondary | ICD-10-CM | POA: Diagnosis not present

## 2020-02-21 DIAGNOSIS — F524 Premature ejaculation: Secondary | ICD-10-CM | POA: Diagnosis not present

## 2020-04-12 ENCOUNTER — Encounter: Payer: Self-pay | Admitting: Internal Medicine

## 2020-05-26 ENCOUNTER — Encounter: Payer: Self-pay | Admitting: Internal Medicine

## 2020-05-26 DIAGNOSIS — K743 Primary biliary cirrhosis: Secondary | ICD-10-CM

## 2020-05-26 MED ORDER — URSODIOL 250 MG PO TABS: 500.0000 mg | ORAL_TABLET | Freq: Two times a day (BID) | ORAL | 0 refills | Status: DC

## 2020-07-19 DIAGNOSIS — M19041 Primary osteoarthritis, right hand: Secondary | ICD-10-CM | POA: Diagnosis not present

## 2020-07-19 DIAGNOSIS — M65341 Trigger finger, right ring finger: Secondary | ICD-10-CM | POA: Insufficient documentation

## 2020-07-20 ENCOUNTER — Other Ambulatory Visit: Payer: Self-pay

## 2020-07-20 ENCOUNTER — Ambulatory Visit (INDEPENDENT_AMBULATORY_CARE_PROVIDER_SITE_OTHER): Payer: Medicare Other | Admitting: Internal Medicine

## 2020-07-20 ENCOUNTER — Encounter: Payer: Self-pay | Admitting: Internal Medicine

## 2020-07-20 VITALS — BP 116/74 | HR 70 | Temp 97.9°F | Resp 16 | Ht 68.0 in | Wt 163.0 lb

## 2020-07-20 DIAGNOSIS — E785 Hyperlipidemia, unspecified: Secondary | ICD-10-CM | POA: Diagnosis not present

## 2020-07-20 DIAGNOSIS — K743 Primary biliary cirrhosis: Secondary | ICD-10-CM | POA: Diagnosis not present

## 2020-07-20 DIAGNOSIS — N400 Enlarged prostate without lower urinary tract symptoms: Secondary | ICD-10-CM

## 2020-07-20 DIAGNOSIS — R7303 Prediabetes: Secondary | ICD-10-CM

## 2020-07-20 DIAGNOSIS — Z23 Encounter for immunization: Secondary | ICD-10-CM | POA: Diagnosis not present

## 2020-07-20 DIAGNOSIS — E034 Atrophy of thyroid (acquired): Secondary | ICD-10-CM

## 2020-07-20 MED ORDER — ATORVASTATIN CALCIUM 20 MG PO TABS: 20.0000 mg | ORAL_TABLET | Freq: Every day | ORAL | 1 refills | Status: DC

## 2020-07-20 NOTE — Patient Instructions (Signed)
Hypothyroidism  Hypothyroidism is when the thyroid gland does not make enough of certain hormones (it is underactive). The thyroid gland is a small gland located in the lower front part of the neck, just in front of the windpipe (trachea). This gland makes hormones that help control how the body uses food for energy (metabolism) as well as how the heart and brain function. These hormones also play a role in keeping your bones strong. When the thyroid is underactive, it produces too little of the hormones thyroxine (T4) and triiodothyronine (T3). What are the causes? This condition may be caused by:  Hashimoto's disease. This is a disease in which the body's disease-fighting system (immune system) attacks the thyroid gland. This is the most common cause.  Viral infections.  Pregnancy.  Certain medicines.  Birth defects.  Past radiation treatments to the head or neck for cancer.  Past treatment with radioactive iodine.  Past exposure to radiation in the environment.  Past surgical removal of part or all of the thyroid.  Problems with a gland in the center of the brain (pituitary gland).  Lack of enough iodine in the diet. What increases the risk? You are more likely to develop this condition if:  You are male.  You have a family history of thyroid conditions.  You use a medicine called lithium.  You take medicines that affect the immune system (immunosuppressants). What are the signs or symptoms? Symptoms of this condition include:  Feeling as though you have no energy (lethargy).  Not being able to tolerate cold.  Weight gain that is not explained by a change in diet or exercise habits.  Lack of appetite.  Dry skin.  Coarse hair.  Menstrual irregularity.  Slowing of thought processes.  Constipation.  Sadness or depression. How is this diagnosed? This condition may be diagnosed based on:  Your symptoms, your medical history, and a physical exam.  Blood  tests. You may also have imaging tests, such as an ultrasound or MRI. How is this treated? This condition is treated with medicine that replaces the thyroid hormones that your body does not make. After you begin treatment, it may take several weeks for symptoms to go away. Follow these instructions at home:  Take over-the-counter and prescription medicines only as told by your health care provider.  If you start taking any new medicines, tell your health care provider.  Keep all follow-up visits as told by your health care provider. This is important. ? As your condition improves, your dosage of thyroid hormone medicine may change. ? You will need to have blood tests regularly so that your health care provider can monitor your condition. Contact a health care provider if:  Your symptoms do not get better with treatment.  You are taking thyroid replacement medicine and you: ? Sweat a lot. ? Have tremors. ? Feel anxious. ? Lose weight rapidly. ? Cannot tolerate heat. ? Have emotional swings. ? Have diarrhea. ? Feel weak. Get help right away if you have:  Chest pain.  An irregular heartbeat.  A rapid heartbeat.  Difficulty breathing. Summary  Hypothyroidism is when the thyroid gland does not make enough of certain hormones (it is underactive).  When the thyroid is underactive, it produces too little of the hormones thyroxine (T4) and triiodothyronine (T3).  The most common cause is Hashimoto's disease, a disease in which the body's disease-fighting system (immune system) attacks the thyroid gland. The condition can also be caused by viral infections, medicine, pregnancy, or past   radiation treatment to the head or neck.  Symptoms may include weight gain, dry skin, constipation, feeling as though you do not have energy, and not being able to tolerate cold.  This condition is treated with medicine to replace the thyroid hormones that your body does not make. This information  is not intended to replace advice given to you by your health care provider. Make sure you discuss any questions you have with your health care provider. Document Revised: 10/17/2017 Document Reviewed: 10/15/2017 Elsevier Patient Education  2020 Elsevier Inc[employer]  

## 2020-07-20 NOTE — Progress Notes (Signed)
Subjective:  Patient ID: William Luna, William Luna, male    DOB: [birth year]  Age: 69 y.o. MRN: [id number]  CC: Hyperlipidemia and Hypothyroidism  This visit occurred during the SARS-CoV-2 public health emergency.  Safety protocols were in place, including screening questions prior to the visit, additional usage of staff PPE, and extensive cleaning of exam room while observing appropriate contact time as indicated for disinfecting solutions.    HPI William Luna, William Luna presents for f/up - He feels well and offers no complaints.  He is active and denies any recent episodes of chest pain, shortness of breath, palpitations, edema, or fatigue.  Outpatient Medications Prior to Visit  Medication Sig Dispense Refill   levothyroxine (SYNTHROID) 112 MCG tablet Take 1 tablet by mouth daily 90 tablet 1   sertraline (ZOLOFT) 50 MG tablet Take 1.5 tablets (75 mg total) by mouth daily. 135 tablet 1   ursodiol (ACTIGALL) 250 MG tablet Take 2 tablets (500 mg total) by mouth in the morning and at bedtime. 360 tablet 0   zaleplon (SONATA) 10 MG capsule Take 1 capsule (10 mg total) by mouth at bedtime as needed for sleep. 50 capsule 1   atorvastatin (LIPITOR) 20 MG tablet Take 20 mg by mouth.     rosuvastatin (CRESTOR) 10 MG tablet Take 1 tablet by mouth daily 90 tablet 1   sertraline (ZOLOFT) 50 MG tablet Take 1 tablet by mouth (50mg ) every other day alternating with 1 & 1/2 tablets (75mg ) every other day 112 tablet 1   No facility-administered medications prior to visit.    ROS Review of Systems  Constitutional: Negative.  Negative for activity change, diaphoresis, fatigue and unexpected weight change.  HENT: Negative.   Respiratory: Negative for cough, chest tightness, shortness of breath and wheezing.   Cardiovascular: Negative for chest pain, palpitations and leg swelling.  Gastrointestinal: Negative for abdominal pain, constipation, diarrhea, nausea and vomiting.  Endocrine: Negative for cold  intolerance and heat intolerance.  Genitourinary: Negative.  Negative for difficulty urinating.  Musculoskeletal: Negative for arthralgias and myalgias.  Skin: Negative.  Negative for color change and pallor.  Neurological: Negative.  Negative for dizziness, weakness and light-headedness.  Hematological: Negative for adenopathy. Does not bruise/bleed easily.  Psychiatric/Behavioral: Negative.     Objective:  BP 116/74    Pulse 70    Temp 97.9 F (36.6 C) (Oral)    Resp 16    Ht 5\' 8"  (1.727 m)    Wt 163 lb (73.9 kg)    SpO2 96%    BMI 24.78 kg/m   BP Readings from Last 3 Encounters:  07/20/20 116/74  07/19/19 124/62  09/17/18 109/69    Wt Readings from Last 3 Encounters:  07/20/20 163 lb (73.9 kg)  07/19/19 164 lb (74.4 kg)  09/17/18 150 lb (68 kg)    Physical Exam Vitals reviewed.  Constitutional:      Appearance: Normal appearance.  HENT:     Nose: Nose normal.     Mouth/Throat:     Mouth: Mucous membranes are moist.  Eyes:     General: No scleral icterus.    Conjunctiva/sclera: Conjunctivae normal.  Cardiovascular:     Rate and Rhythm: Normal rate and regular rhythm.     Heart sounds: No murmur heard.   Pulmonary:     Effort: Pulmonary effort is normal.     Breath sounds: No stridor. No wheezing, rhonchi or rales.  Abdominal:     General: Abdomen is flat. Bowel sounds are  normal. There is no distension.     Palpations: Abdomen is soft. There is no hepatomegaly, splenomegaly or mass.     Tenderness: There is no abdominal tenderness.  Musculoskeletal:        General: Normal range of motion.     Cervical back: Neck supple.     Right lower leg: No edema.     Left lower leg: No edema.  Lymphadenopathy:     Cervical: No cervical adenopathy.  Skin:    General: Skin is warm and dry.  Neurological:     General: No focal deficit present.     Mental Status: He is alert.     Lab Results  Component Value Date   WBC 6.4 02/09/2020   HGB 15.0 02/09/2020   HCT 44  02/09/2020   PLT 198 02/09/2020   GLUCOSE 103 (H) 12/01/2019   CHOL 165 02/09/2020   TRIG 127 02/09/2020   HDL 48 02/09/2020   LDLDIRECT 134.5 12/15/2006   LDLCALC 94 02/09/2020   ALT 41 (A) 02/09/2020   AST 54 (A) 02/09/2020   NA 139 02/09/2020   K 4.6 02/09/2020   CL 101 02/09/2020   CREATININE 1.0 02/09/2020   BUN 21 02/09/2020   CO2 26 (A) 02/09/2020   TSH 3.37 02/09/2020   PSA 0.7 01/19/2020   INR 1.1 (H) 12/01/2019   HGBA1C 5.9 02/09/2020    US BIOPSY (LIVER)  Result Date: 09/17/2018 CLINICAL DATA:  Abnormal liver function tests and positive antinuclear antibody, anti smooth muscle antibody and elevated mitochondrial antibody. EXAM: ULTRASOUND GUIDED CORE BIOPSY OF LIVER MEDICATIONS: 1.5 mg IV Versed; 75 mcg IV Fentanyl Total Moderate Sedation Time: 19 minutes. The patient's level of consciousness and physiologic status were continuously monitored during the procedure by Radiology nursing. PROCEDURE: The procedure, risks, benefits, and alternatives were explained to the patient. Questions regarding the procedure were encouraged and answered. The patient understands and consents to the procedure. A time out was performed prior to initiating the procedure. Ultrasound was performed to localize the liver. The right abdominal wall was prepped with chlorhexidine in a sterile fashion, and a sterile drape was applied covering the operative field. A sterile gown and sterile gloves were used for the procedure. Local anesthesia was provided with 1% Lidocaine. Under ultrasound guidance, a 17 gauge trocar needle was advanced into the right lobe of the liver. Three separate coaxial 18 gauge core biopsy samples were obtained. Material was submitted in formalin. Gel-Foam pledgets were advanced through the outer needle as it was retracted. Additional ultrasound imaging was performed. COMPLICATIONS: None. FINDINGS: Solid core biopsy samples were obtained of the liver. There were no immediate  complications. IMPRESSION: Ultrasound-guided core biopsy performed of the right lobe of the liver. Electronically Signed   By: Aletta Edouard M.D.   On: 09/17/2018 09:58    Assessment & Plan:   [name] was seen today for hyperlipidemia and hypothyroidism.  Diagnoses and all orders for this visit:  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 atrophy of thyroid- His TSH is in the normal range.  He will stay on the current dose of levothyroxine. -     TSH; Future  Benign prostatic hyperplasia without lower urinary tract symptoms  Primary biliary cholangitis ([place])- His LFTs have improved some.  Will continue the current dose of Actigall. -     Gamma GT; Future -     Hepatic function panel; Future -     Protime-INR; Future -     APTT; Future  Hyperlipidemia LDL  goal <130- He has achieved his LDL goal and is doing well on the statin. -     atorvastatin (LIPITOR) 20 MG tablet; Take 1 tablet (20 mg total) by mouth daily. -     CK; Future -     Lipid panel; Future  Prediabetes- His A1c is stable at 5.9%. -     Hemoglobin A1c; Future -     BASIC METABOLIC PANEL WITH GFR; Future   I have discontinued William Luna, William Luna "Jeff"'s rosuvastatin. I have also changed his atorvastatin. Additionally, I am having him maintain his zaleplon, levothyroxine, sertraline, and ursodiol.  Meds ordered this encounter  Medications   atorvastatin (LIPITOR) 20 MG tablet    Sig: Take 1 tablet (20 mg total) by mouth daily.    Dispense:  90 tablet    Refill:  1     Follow-up: Return in about 6 months (around 01/17/2021).  William Calico, William Luna

## 2020-07-21 ENCOUNTER — Encounter: Payer: Self-pay | Admitting: Internal Medicine

## 2020-07-21 ENCOUNTER — Telehealth: Payer: Self-pay | Admitting: Internal Medicine

## 2020-07-21 LAB — BASIC METABOLIC PANEL WITH GFR
BUN: 15 mg/dL (ref 7–25)
CO2: 31 mmol/L (ref 20–32)
Calcium: 9.9 mg/dL (ref 8.6–10.3)
Chloride: 102 mmol/L (ref 98–110)
Creat: 0.89 mg/dL (ref 0.70–1.18)
GFR, Est African American: 100 mL/min/{1.73_m2} (ref >=60)
GFR, Est Non African American: 86 mL/min/{1.73_m2} (ref >=60)
Glucose, Bld: 104 mg/dL — ABNORMAL HIGH (ref 65–99)
Potassium: 4.7 mmol/L (ref 3.5–5.3)
Sodium: 140 mmol/L (ref 135–146)

## 2020-07-21 LAB — HEPATIC FUNCTION PANEL
AG Ratio: 1.4 (calc) (ref 1.0–2.5)
ALT: 34 U/L (ref 9–46)
AST: 39 U/L — ABNORMAL HIGH (ref 10–35)
Albumin: 4.3 g/dL (ref 3.6–5.1)
Alkaline phosphatase (APISO): 104 U/L (ref 35–144)
Bilirubin, Direct: 0.1 mg/dL (ref 0.0–0.2)
Globulin: 3.1 g/dL (calc) (ref 1.9–3.7)
Indirect Bilirubin: 0.4 mg/dL (calc) (ref 0.2–1.2)
Total Bilirubin: 0.5 mg/dL (ref 0.2–1.2)
Total Protein: 7.4 g/dL (ref 6.1–8.1)

## 2020-07-21 LAB — HEMOGLOBIN A1C
Hgb A1c MFr Bld: 5.9 % of total Hgb — ABNORMAL HIGH (ref <5.7)
Mean Plasma Glucose: 123 (calc)
eAG (mmol/L): 6.8 (calc)

## 2020-07-21 LAB — PROTIME-INR
INR: 1
Prothrombin Time: 10.8 s (ref 9.0–11.5)

## 2020-07-21 LAB — LIPID PANEL
Cholesterol: 156 mg/dL (ref <200)
HDL: 48 mg/dL (ref >=40)
LDL Cholesterol (Calc): 91 mg/dL (calc)
Non-HDL Cholesterol (Calc): 108 mg/dL (calc) (ref <130)
Total CHOL/HDL Ratio: 3.3 (calc) (ref <5.0)
Triglycerides: 80 mg/dL (ref <150)

## 2020-07-21 LAB — APTT: aPTT: 27 s (ref 23–32)

## 2020-07-21 LAB — GAMMA GT: GGT: 59 U/L (ref 3–70)

## 2020-07-21 LAB — CK: Total CK: 95 U/L (ref 44–196)

## 2020-07-21 LAB — TSH: TSH: 2.5 mIU/L (ref 0.40–4.50)

## 2020-07-21 NOTE — Telephone Encounter (Signed)
[phone] RE: [id number]  With new atorvastation script, is the rosuvastatin to be discontinued?  Please advise   Herbalist ([place], [place] Phone:  [phone]  Fax:  [phone]

## 2020-07-25 NOTE — Telephone Encounter (Signed)
Pharmacist has been informed

## 2020-07-31 ENCOUNTER — Telehealth: Payer: Self-pay | Admitting: Internal Medicine

## 2020-07-31 DIAGNOSIS — E785 Hyperlipidemia, unspecified: Secondary | ICD-10-CM

## 2020-07-31 NOTE — Progress Notes (Signed)
°  Chronic Care Management   Note  07/31/2020 Name: [name], William Luna MRN: [id number] DOB: May 17, 1951  William Bjornstad, William Luna is a 69 y.o. year old male who is a primary care patient of William Lima, William Luna. I reached out to William Bjornstad, William Luna by phone today in response to a referral sent by Mr. William Bjornstad, William Luna's PCP, William Lima, William Luna.   [name] was given information about Chronic Care Management services today including:  1. CCM service includes personalized support from designated clinical staff supervised by his physician, including individualized plan of care and coordination with other care providers 2. 24/7 contact phone numbers for assistance for urgent and routine care needs. 3. Service will only be billed when office clinical staff spend 20 minutes or more in a month to coordinate care. 4. Only one practitioner may furnish and bill the service in a calendar month. 5. The patient may stop CCM services at any time (effective at the end of the month) by phone call to the office staff.   Patient agreed to services and verbal consent obtained.   Follow up plan:   Carley Perdue [employer] Scheduler

## 2020-08-02 ENCOUNTER — Other Ambulatory Visit: Payer: Self-pay | Admitting: Internal Medicine

## 2020-08-02 ENCOUNTER — Encounter: Payer: Self-pay | Admitting: Internal Medicine

## 2020-08-02 DIAGNOSIS — K743 Primary biliary cirrhosis: Secondary | ICD-10-CM

## 2020-08-02 DIAGNOSIS — E034 Atrophy of thyroid (acquired): Secondary | ICD-10-CM

## 2020-08-02 DIAGNOSIS — R4681 Obsessive-compulsive behavior: Secondary | ICD-10-CM

## 2020-08-02 MED ORDER — LEVOTHYROXINE SODIUM 112 MCG PO TABS: 112.0000 ug | ORAL_TABLET | Freq: Every day | ORAL | 1 refills | Status: DC

## 2020-08-02 MED ORDER — SERTRALINE HCL 50 MG PO TABS: 75.0000 mg | ORAL_TABLET | Freq: Every day | ORAL | 1 refills | Status: DC

## 2020-08-02 MED ORDER — URSODIOL 250 MG PO TABS: 500.0000 mg | ORAL_TABLET | Freq: Two times a day (BID) | ORAL | 1 refills | Status: DC

## 2020-08-02 MED ORDER — SERTRALINE HCL 50 MG PO TABS: 100.0000 mg | ORAL_TABLET | Freq: Every day | ORAL | 1 refills | Status: DC

## 2020-08-16 DIAGNOSIS — Z23 Encounter for immunization: Secondary | ICD-10-CM | POA: Diagnosis not present

## 2020-08-18 DIAGNOSIS — M65341 Trigger finger, right ring finger: Secondary | ICD-10-CM | POA: Diagnosis not present

## 2020-08-30 DIAGNOSIS — K146 Glossodynia: Secondary | ICD-10-CM | POA: Diagnosis not present

## 2020-09-06 ENCOUNTER — Ambulatory Visit (INDEPENDENT_AMBULATORY_CARE_PROVIDER_SITE_OTHER): Payer: Medicare Other

## 2020-09-06 ENCOUNTER — Other Ambulatory Visit: Payer: Self-pay

## 2020-09-06 VITALS — BP 122/70 | HR 72 | Temp 98.2°F | Ht 68.0 in | Wt 164.4 lb

## 2020-09-06 DIAGNOSIS — Z0000 Encounter for general adult medical examination without abnormal findings: Secondary | ICD-10-CM

## 2020-09-06 NOTE — Patient Instructions (Addendum)
[name] , Thank you for taking time to come for your Medicare Wellness Visit. I appreciate your ongoing commitment to your health goals. Please review the following plan we discussed and let me know if I can assist you in the future.   Screening recommendations/referrals: Colonoscopy: 09/07/2018; due every 5 years Recommended yearly ophthalmology/optometry visit for glaucoma screening and checkup Recommended yearly dental visit for hygiene and checkup  Vaccinations: Influenza vaccine: 07/20/2020 Pneumococcal vaccine: up to date Tdap vaccine: 07/24/2012; due every 10 years Shingles vaccine: up to date   Covid-19: up to date including booster  Advanced directives: Please bring a copy of your health care power of attorney and living will to the office at your convenience.  Conditions/risks identified: Yes; Reviewed health maintenance screenings with patient today and relevant education, vaccines, and/or referrals were provided. Please continue to do your personal lifestyle choices by: daily care of teeth and gums, regular physical activity (goal should be 5 days a week for 30 minutes), eat a healthy diet, avoid tobacco and drug use, limiting any alcohol intake, taking a low-dose aspirin (if not allergic or have been advised by your provider otherwise) and taking vitamins and minerals as recommended by your provider. Continue doing brain stimulating activities (puzzles, reading, adult coloring books, staying active) to keep memory sharp. Continue to eat heart healthy diet (full of fruits, vegetables, whole grains, lean protein, water--limit salt, fat, and sugar intake) and increase physical activity as tolerated.  Next appointment: Please schedule your next Medicare Wellness Visit with your Nurse Health Advisor in 1 year by calling [phone]. Preventive Care 9 Years and Older, Male Preventive care refers to lifestyle choices and visits with your health care provider that can promote health and  wellness. What does preventive care include?  A yearly physical exam. This is also called an annual well check.  Dental exams once or twice a year.  Routine eye exams. Ask your health care provider how often you should have your eyes checked.  Personal lifestyle choices, including:  Daily care of your teeth and gums.  Regular physical activity.  Eating a healthy diet.  Avoiding tobacco and drug use.  Limiting alcohol use.  Practicing safe sex.  Taking low doses of aspirin every day.  Taking vitamin and mineral supplements as recommended by your health care provider. What happens during an annual well check? The services and screenings done by your health care provider during your annual well check will depend on your age, overall health, lifestyle risk factors, and family history of disease. Counseling  Your health care provider may ask you questions about your:  Alcohol use.  Tobacco use.  Drug use.  Emotional well-being.  Home and relationship well-being.  Sexual activity.  Eating habits.  History of falls.  Memory and ability to understand (cognition).  Work and work Statistician. Screening  You may have the following tests or measurements:  Height, weight, and BMI.  Blood pressure.  Lipid and cholesterol levels. These may be checked every 5 years, or more frequently if you are over 58 years old.  Skin check.  Lung cancer screening. You may have this screening every year starting at age 14 if you have a 30-pack-year history of smoking and currently smoke or have quit within the past 15 years.  Fecal occult blood test (FOBT) of the stool. You may have this test every year starting at age 28.  Flexible sigmoidoscopy or colonoscopy. You may have a sigmoidoscopy every 5 years or a colonoscopy every  10 years starting at age 7.  Prostate cancer screening. Recommendations will vary depending on your family history and other risks.  Hepatitis C blood  test.  Hepatitis B blood test.  Sexually transmitted disease (STD) testing.  Diabetes screening. This is done by checking your blood sugar (glucose) after you have not eaten for a while (fasting). You may have this done every 1-3 years.  Abdominal aortic aneurysm (AAA) screening. You may need this if you are a current or former smoker.  Osteoporosis. You may be screened starting at age 33 if you are at high risk. Talk with your health care provider about your test results, treatment options, and if necessary, the need for more tests. Vaccines  Your health care provider may recommend certain vaccines, such as:  Influenza vaccine. This is recommended every year.  Tetanus, diphtheria, and acellular pertussis (Tdap, Td) vaccine. You may need a Td booster every 10 years.  Zoster vaccine. You may need this after age 80.  Pneumococcal 13-valent conjugate (PCV13) vaccine. One dose is recommended after age 53.  Pneumococcal polysaccharide (PPSV23) vaccine. One dose is recommended after age 22. Talk to your health care provider about which screenings and vaccines you need and how often you need them. This information is not intended to replace advice given to you by your health care provider. Make sure you discuss any questions you have with your health care provider. Document Released: 12/01/2015 Document Revised: 07/24/2016 Document Reviewed: 09/05/2015 Elsevier Interactive Patient Education  2017 [employer] Prevention in the Home Falls can cause injuries. They can happen to people of all ages. There are many things you can do to make your home safe and to help prevent falls. What can I do on the outside of my home?  Regularly fix the edges of walkways and driveways and fix any cracks.  Remove anything that might make you trip as you walk through a door, such as a raised step or threshold.  Trim any bushes or trees on the path to your home.  Use bright outdoor  lighting.  Clear any walking paths of anything that might make someone trip, such as rocks or tools.  Regularly check to see if handrails are loose or broken. Make sure that both sides of any steps have handrails.  Any raised decks and porches should have guardrails on the edges.  Have any leaves, snow, or ice cleared regularly.  Use sand or salt on walking paths during winter.  Clean up any spills in your garage right away. This includes oil or grease spills. What can I do in the bathroom?  Use night lights.  Install grab bars by the toilet and in the tub and shower. Do not use towel bars as grab bars.  Use non-skid mats or decals in the tub or shower.  If you need to sit down in the shower, use a plastic, non-slip stool.  Keep the floor dry. Clean up any water that spills on the floor as soon as it happens.  Remove soap buildup in the tub or shower regularly.  Attach bath mats securely with double-sided non-slip rug tape.  Do not have throw rugs and other things on the floor that can make you trip. What can I do in the bedroom?  Use night lights.  Make sure that you have a light by your bed that is easy to reach.  Do not use any sheets or blankets that are too big for your bed. They should not  hang down onto the floor.  Have a firm chair that has side arms. You can use this for support while you get dressed.  Do not have throw rugs and other things on the floor that can make you trip. What can I do in the kitchen?  Clean up any spills right away.  Avoid walking on wet floors.  Keep items that you use a lot in easy-to-reach places.  If you need to reach something above you, use a strong step stool that has a grab bar.  Keep electrical cords out of the way.  Do not use floor polish or wax that makes floors slippery. If you must use wax, use non-skid floor wax.  Do not have throw rugs and other things on the floor that can make you trip. What can I do with my  stairs?  Do not leave any items on the stairs.  Make sure that there are handrails on both sides of the stairs and use them. Fix handrails that are broken or loose. Make sure that handrails are as long as the stairways.  Check any carpeting to make sure that it is firmly attached to the stairs. Fix any carpet that is loose or worn.  Avoid having throw rugs at the top or bottom of the stairs. If you do have throw rugs, attach them to the floor with carpet tape.  Make sure that you have a light switch at the top of the stairs and the bottom of the stairs. If you do not have them, ask someone to add them for you. What else can I do to help prevent falls?  Wear shoes that:  Do not have high heels.  Have rubber bottoms.  Are comfortable and fit you well.  Are closed at the toe. Do not wear sandals.  If you use a stepladder:  Make sure that it is fully opened. Do not climb a closed stepladder.  Make sure that both sides of the stepladder are locked into place.  Ask someone to hold it for you, if possible.  Clearly mark and make sure that you can see:  Any grab bars or handrails.  First and last steps.  Where the edge of each step is.  Use tools that help you move around (mobility aids) if they are needed. These include:  Canes.  Walkers.  Scooters.  Crutches.  Turn on the lights when you go into a dark area. Replace any light bulbs as soon as they burn out.  Set up your furniture so you have a clear path. Avoid moving your furniture around.  If any of your floors are uneven, fix them.  If there are any pets around you, be aware of where they are.  Review your medicines with your doctor. Some medicines can make you feel dizzy. This can increase your chance of falling. Ask your doctor what other things that you can do to help prevent falls. This information is not intended to replace advice given to you by your health care provider. Make sure you discuss any  questions you have with your health care provider. Document Released: 08/31/2009 Document Revised: 04/11/2016 Document Reviewed: 12/09/2014 Elsevier Interactive Patient Education  2017 [employer].

## 2020-09-06 NOTE — Progress Notes (Signed)
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  Review of Systems    No ROS. Medicare Wellness Vistit Cardiac Risk Factors include: advanced age (>43men, >37 women);dyslipidemia;family history of premature cardiovascular disease;male gender     Objective:    Today's Vitals   09/06/20 0918  BP: 122/70  Pulse: 72  Temp: 98.2 F (36.8 C)  SpO2: 96%  Weight: 164 lb 6.4 oz (74.6 kg)  Height: 5\' 8"  (1.727 m)  PainSc: 0-No pain   Body mass index is 25 kg/m.  Advanced Directives 09/06/2020 09/17/2018 01/06/2018 06/19/2017 10/02/2016 07/04/2016 04/30/2016  Does Patient Have a Medical Advance Directive? Yes Yes Yes No Yes Yes Yes  Type of Paramedic of [place];Living will [place];Living will [place];Living will - [place];Living will Living will;Healthcare Power of [place];Living will  Does patient want to make changes to medical advance directive? No - Patient declined No - Patient declined - - No - Patient declined - -  Copy of [place] in Chart? No - copy requested No - copy requested No - copy requested - Yes - -  Pre-existing out of facility DNR order (yellow form or pink MOST form) - - - - - - -    Current Medications (verified) Outpatient Encounter Medications as of 09/06/2020  Medication Sig   atorvastatin (LIPITOR) 20 MG tablet Take 1 tablet (20 mg total) by mouth daily.   levothyroxine (SYNTHROID) 112 MCG tablet Take 1 tablet (112 mcg total) by mouth daily.   sertraline (ZOLOFT) 50 MG tablet Take 2 tablets (100 mg total) by mouth daily.   ursodiol (ACTIGALL) 250 MG tablet Take 2 tablets (500 mg total) by mouth in the morning and at bedtime.   zaleplon (SONATA) 10 MG capsule Take 1 capsule (10 mg total) by mouth at bedtime as needed for sleep.   No  facility-administered encounter medications on file as of 09/06/2020.    Allergies (verified) Patient has no known allergies.   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 ([place])   mid-tongue squamous cell - excised, clean margins, 11 nodes negative, no adjuvant therapy   TRANSAMINASES, SERUM, ELEVATED    Past Surgical History:  Procedure Laterality Date   BACK SURGERY  '90s ([place])   CERVICAL DISC - diskectomy w/ fusion, anterior   COLONOSCOPY     Multiple   ESOPHAGOGASTRODUODENOSCOPY     Multiple   HEMIGLOSSECTOMY  10/30/2011   Procedure: HEMIGLOSSECTOMY;  Surgeon: Cecil Cranker;  Location: [facility] OR;  Service: ENT;  Laterality: Right;  Right tongue resection   LUMBAR LAMINECTOMY/DECOMPRESSION MICRODISCECTOMY  01/29/2012   Procedure: LUMBAR LAMINECTOMY/DECOMPRESSION MICRODISCECTOMY;  Surgeon: Kristeen Miss, MD;  Location: [facility] NEURO ORS;  Service: Neurosurgery;  Laterality: Left;  Left L5-S1 Microdiskectomy   LUMBAR LAMINECTOMY/DECOMPRESSION MICRODISCECTOMY  07/13/2012   Procedure: LUMBAR LAMINECTOMY/DECOMPRESSION MICRODISCECTOMY 1 LEVEL;  Surgeon: Kristeen Miss, MD;  Location: [facility] NEURO ORS;  Service: Neurosurgery;  Laterality: Left;  Redo Left Lumbar five-sacral one Microdiskectomy   nail avulsion     right first finger due to a wart   RADICAL NECK DISSECTION  10/30/2011   Procedure: RADICAL NECK  DISSECTION;  Surgeon: Cecil Cranker;  Location: [facility] OR;  Service: ENT;  Laterality: Right;   Family History  Problem Relation Age of Onset   Diabetes Mother    Anemia Mother    Cirrhosis Mother 51       primary biliary cirrhosis   Hyperlipidemia Father    Hypertension Father    Heart disease Father    Colon cancer Maternal Grandfather    COPD Neg Hx    Social History   Socioeconomic History   Marital status: Married    Spouse name: [name]   Number of children: 2   Years of education: 27   Highest education level: Not on file  Occupational History   Occupation: Secretary/administrator: [employer]  Tobacco Use   Smoking status: Never Smoker   Smokeless tobacco: Never Used  Scientific laboratory technician Use: Never used  Substance and Sexual Activity   Alcohol use: Yes    Alcohol/week: 0.0 standard drinks    Comment: OCC.   Drug use: No   Sexual activity: Yes    Partners: Female    Birth control/protection: None  Other Topics Concern   Not on file  Social History Narrative   [employer] [ [place];; scholar}; [place],   [employer] residency/fellowship.    Married- 1977 - 25 years/divorced;  Married 2006.    1 son  b1980, 1 daughter - [phone]; Pearson Grippe [phone],  Step-daughter [phone].    Grandchildren - 2 (b'2010, 2013).    Work - Oncologist. Retired 2016   ACP/Living will:  Wife  with healthcare POA; Yes - CPR, Yes - short-term mechanical ventilation,  no futile care.   Patient no alcohol no tobacco or drug use   Social Determinants of Radio broadcast assistant Strain: Low Risk    Difficulty of Paying Living Expenses: Not hard at all  Food Insecurity: No Food Insecurity   Worried About Charity fundraiser in the Last Year: Never true   [place] in the Last Year: Never true  Transportation Needs: No Transportation Needs   Lack of Transportation (Medical): No   Lack of Transportation (Non-Medical): No  Physical Activity:  Sufficiently Active   Days of Exercise per Week: 5 days   Minutes of Exercise per Session: 30 min  Stress: No Stress Concern Present   Feeling of Stress : Not at all  Social Connections: Socially Integrated   Frequency of Communication with Friends and Family: More than three times a week   Frequency of Social Gatherings with Friends and Family: More than three times a week   Attends Religious Services: More than 4 times per year   Active Member of [employer] or Organizations: Yes   Attends Music therapist: More than 4 times per year   Marital Status: Married    Tobacco Counseling Counseling given: Not Answered   Clinical Intake:  Pre-visit preparation completed: Yes  Pain : No/denies pain Pain  Score: 0-No pain     BMI - recorded: 25 Nutritional Status: BMI 25 -29 Overweight Nutritional Risks: None Diabetes: No  How often do you need to have someone help you when you read instructions, pamphlets, or other written materials from your doctor or pharmacy?: 1 - Never What is the last grade level you completed in school?: Medical School  Diabetic? No  Interpreter Needed?: No  Information entered by :: Lisette Abu, LPN   Activities of Daily Living In your present state of health, do you have any difficulty performing the following activities: 09/06/2020  Hearing? Y  Comment decreased hearing in left ear.  Vision? N  Difficulty concentrating or making decisions? N  Walking or climbing stairs? N  Dressing or bathing? N  Doing errands, shopping? N  Preparing Food and eating ? N  Using the Toilet? N  In the past six months, have you accidently leaked urine? N  Do you have problems with loss of bowel control? N  Managing your Medications? N  Managing your Finances? N  Housekeeping or managing your Housekeeping? N  Some recent data might be hidden    Patient Care Team: Janith Lima, MD as PCP - General (Internal Medicine) Lavonna Monarch, MD  (Dermatology) Nobie Putnam, MD (Hematology and Oncology) Kristeen Miss, MD (Neurosurgery) Melissa Montane, MD (Otolaryngology) Vicie Mutters, MD Lafayette Dragon, MD (Inactive) (Gastroenterology) Charlton Haws, [facility] as Pharmacist (Pharmacist)  Indicate any recent Medical Services you may have received from other than [facility] providers in the past year (date may be approximate).     Assessment:   This is a routine wellness examination for D[place].  Hearing/Vision screen No exam data present  Dietary issues and exercise activities discussed: Current Exercise Habits: Home exercise routine, Type of exercise: walking;Other - see comments (biking, gardening; repair work), Time (Minutes): 30, Frequency (Times/Week): 5, Weekly Exercise (Minutes/Week): 150, Intensity: Moderate, Exercise limited by: None identified  Goals     Patient Stated     I would like to eat healthier by planning my meals better at home and monitoring my carbohydrate intake.  Continue to exercise routinely, enjoy doing my home work projects. Continue to travel and ski.       Depression Screen PHQ 2/9 Scores 07/20/2020 07/19/2019 01/06/2018 10/02/2016 08/01/2016 07/04/2016 10/09/2015  PHQ - 2 Score 0 0 1 0 0 0 0  PHQ- 9 Score - 0 1 - - - -    Fall Risk Fall Risk  09/06/2020 07/20/2020 07/19/2019 06/18/2019 01/06/2018  Falls in the past year? 0 0 0 0 No  Comment - - - [place]i Telephone Survey: data to providers prior to load -  Number falls in past yr: 0 - 0 - -  Injury with Fall? 0 - 0 - -  Risk for fall due to : No Fall Risks - - - -  Follow up Falls evaluation completed - Falls evaluation completed - -    Any stairs in or around the home? Yes  If so, are there any without handrails? No  Home free of loose throw rugs in walkways, pet beds, electrical cords, etc? Yes  Adequate lighting in your home to reduce risk of falls? Yes   ASSISTIVE DEVICES UTILIZED TO PREVENT FALLS:  Life alert? No  Use of a cane, walker or w/c? No    Grab bars in the bathroom? Yes  Shower chair or bench in shower? Yes  Elevated toilet seat or a handicapped toilet? Yes  TIMED UP AND GO:  Was the test performed? No .  Length of time to ambulate 10 feet: 0 sec.   Gait steady and fast without use of assistive device  Cognitive Function: MMSE - Mini Mental State Exam 01/06/2018  Not completed: Refused        Immunizations Immunization History  Administered Date(s) Administered   Fluad Quad(high Dose 65+) 07/19/2019   Hepatitis A 11/13/2016   Hepatitis A, Adult 04/19/2016   Hepatitis B 04/10/1989, 05/14/1989, 10/22/1989   Influenza,inj,Quad PF,6+ Mos 08/26/2011, 08/26/2012, 08/05/2013, 08/04/2014, 07/19/2017, 07/20/2020   Influenza-Unspecified 08/26/2011, 08/19/2015, 08/15/2016, 07/19/2018   PFIZER SARS-COV-2 Vaccination 12/01/2019, 12/22/2019   PPD Test 02/19/2005, 03/04/2006, 03/05/2007, 04/15/2008, 02/20/2009, 05/03/2010, 05/07/2010   Pneumococcal Conjugate-13 10/09/2015, 11/21/2015   Pneumococcal Polysaccharide-23 01/06/2018   Td 11/18/1998   Tdap 07/24/2012   Typhoid Live 04/19/2016   Zoster 10/20/2012   Zoster Recombinat (Shingrix) 08/18/2018, 11/04/2018    TDAP status: Up to date Flu Vaccine status: Up to date Pneumococcal vaccine status: Up to date Covid-19 vaccine status: Completed vaccines  Qualifies for Shingles Vaccine? Yes   Zostavax completed Yes   Shingrix Completed?: Yes  Screening Tests Health Maintenance  Topic Date Due   TETANUS/TDAP  07/24/2022   COLONOSCOPY  09/08/2023   INFLUENZA VACCINE  Completed   COVID-19 Vaccine  Completed   Hepatitis C Screening  Completed   PNA vac Low Risk Adult  Completed    Health Maintenance  There are no preventive care reminders to display for this patient.  Colorectal cancer screening: Completed 09/07/2018. Repeat every 5 years  Lung Cancer Screening: (Low Dose CT Chest recommended if Age 19-80 years, 30 pack-year currently  smoking OR have quit w/in 15years.) does not qualify.   Lung Cancer Screening Referral: no  Additional Screening:  Hepatitis C Screening: does qualify; Completed yes  Vision Screening: Recommended annual ophthalmology exams for early detection of glaucoma and other disorders of the eye. Is the patient up to date with their annual eye exam?  Yes  Who is the provider or what is the name of the office in which the patient attends annual eye exams? Macarthur Critchley, MD If pt is not established with a provider, would they like to be referred to a provider to establish care? No .   Dental Screening: Recommended annual dental exams for proper oral hygiene  Community Resource Referral / Chronic Care Management: CRR required this visit?  No   CCM required this visit?  No      Plan:     I have personally reviewed and noted the following in the patients chart:    Medical and social history  Use of alcohol, tobacco or illicit drugs   Current medications and supplements  Functional ability and status  Nutritional status  Physical activity  Advanced directives  List of other physicians  Hospitalizations, surgeries, and ER visits in previous 12 months  Vitals  Screenings to include cognitive, depression, and falls  Referrals and appointments  In addition, I have reviewed and discussed with patient certain preventive protocols, quality metrics, and best practice recommendations. A written personalized care plan for preventive services as well as general preventive health recommendations were provided to patient.     Sheral Flow, LPN   01/60/1093   Nurse Notes:  Patient is cogitatively intact. Patient stated that he has no issues with gait or balance; does not use any assistive devices.

## 2020-09-07 ENCOUNTER — Telehealth: Payer: Self-pay | Admitting: Pharmacist

## 2020-09-07 ENCOUNTER — Ambulatory Visit: Payer: Medicare Other

## 2020-09-07 NOTE — Progress Notes (Signed)
    Chronic Care Management Pharmacy Assistant   Name: William BETHEL, MD  MRN: [id number] DOB: 1951-08-26  Reason for Encounter: Reschedule Appointment   PCP : Janith Lima, MD  Allergies:  No Known Allergies  Medications: Outpatient Encounter Medications as of 09/07/2020  Medication Sig  . atorvastatin (LIPITOR) 20 MG tablet Take 1 tablet (20 mg total) by mouth daily.  Marland Kitchen levothyroxine (SYNTHROID) 112 MCG tablet Take 1 tablet (112 mcg total) by mouth daily.  . sertraline (ZOLOFT) 50 MG tablet Take 2 tablets (100 mg total) by mouth daily.  . ursodiol (ACTIGALL) 250 MG tablet Take 2 tablets (500 mg total) by mouth in the morning and at bedtime.  . zaleplon (SONATA) 10 MG capsule Take 1 capsule (10 mg total) by mouth at bedtime as needed for sleep.   No facility-administered encounter medications on file as of 09/07/2020.    Current Diagnosis: Patient Active Problem List   Diagnosis Date Noted  . 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 Family history of colon cancer grandparent 09/02/2018  . Routine general medical examination at a health care facility 10/02/2016  . 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 atrophy of thyroid 10/01/2016  . Benign prostatic hyperplasia without lower urinary tract symptoms 10/01/2016  . 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 Prediabetes 10/09/2015  . Squamous cell cancer of tongue ([place])   . Hyperlipidemia LDL goal <130 11/17/2008  . Obsessive behaviors 11/17/2008  . GERD 11/17/2008  . Degenerative joint disease (DJD) of lumbar spine 11/17/2008  . CERVICAL RADICULOPATHY 11/17/2008    Goals Addressed   None     Follow-Up:  Pharmacist Review   The patient had to get rescheduled for his initial appointment today at 2pm with the clinical pharmacist due to her being out of hr office. The patient has been rescheduled for 10/05/20 @11  am  Rosendo Gros, [place] Team Manager/ CPA (Clinical Pharmacist Assistant) [phone]

## 2020-10-04 ENCOUNTER — Telehealth: Payer: Self-pay | Admitting: Pharmacist

## 2020-10-04 NOTE — Progress Notes (Signed)
° ° °  Chronic Care Management Pharmacy Assistant   Name: SILVESTER REIERSON, MD  MRN: [id number] DOB: 1950-12-01  Reason for Encounter: Initial Questions for Pharmacist visit on 10/05/20   PCP : Janith Lima, MD  Allergies:  No Known Allergies  Medications: Outpatient Encounter Medications as of 10/04/2020  Medication Sig   atorvastatin (LIPITOR) 20 MG tablet Take 1 tablet (20 mg total) by mouth daily.   levothyroxine (SYNTHROID) 112 MCG tablet Take 1 tablet (112 mcg total) by mouth daily.   sertraline (ZOLOFT) 50 MG tablet Take 2 tablets (100 mg total) by mouth daily.   ursodiol (ACTIGALL) 250 MG tablet Take 2 tablets (500 mg total) by mouth in the morning and at bedtime.   zaleplon (SONATA) 10 MG capsule Take 1 capsule (10 mg total) by mouth at bedtime as needed for sleep.   No facility-administered encounter medications on file as of 10/04/2020.    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 The patient states that he last saw Medicare Wellness representative   Any changes in your medications or health? No changes in health or medications  Any side effects from any medications? None   Do you  have an symptoms or problems not managed by your medications? None  Any concerns about your health right now? No concerns at this time  Has your provider asked that you check blood pressure, blood sugar, or follow special diet at home? Patient states that his blood pressure is normal does not check regularly and is not following any special diet  Do you get any type of exercise on a regular basis? Patient get plenty of exercise walking, hill climbing raking leaves, riding bike  Can you think of a goal you would like to reach for your health? No  Do you have any problems getting your medications? No  Is there anything that you would like to discuss during the appointment? No  Please bring medications and supplements to appointment     Wendy Poet, [place]

## 2020-10-05 ENCOUNTER — Other Ambulatory Visit: Payer: Self-pay

## 2020-10-05 ENCOUNTER — Ambulatory Visit: Payer: Medicare Other | Admitting: Pharmacist

## 2020-10-05 DIAGNOSIS — K743 Primary biliary cirrhosis: Secondary | ICD-10-CM

## 2020-10-05 DIAGNOSIS — R7303 Prediabetes: Secondary | ICD-10-CM

## 2020-10-05 DIAGNOSIS — E785 Hyperlipidemia, unspecified: Secondary | ICD-10-CM

## 2020-10-05 NOTE — Chronic Care Management (AMB) (Signed)
Chronic Care Management Pharmacy  Name: William PALMERI, MD  MRN: [id number] DOB: 04/14/1951   Chief Complaint/ HPI  Carlena Bjornstad, MD,  69 y.o. , male presents for their Initial CCM visit with the clinical pharmacist In office.  PCP : Janith Lima, MD Patient Care Team: Janith Lima, MD as PCP - General (Internal Medicine) Lavonna Monarch, MD (Dermatology) Nobie Putnam, MD (Hematology and Oncology) Kristeen Miss, MD (Neurosurgery) Melissa Montane, MD (Otolaryngology) Vicie Mutters, MD Lafayette Dragon, MD (Inactive) (Gastroenterology) Charlton Haws, [facility] as Pharmacist (Pharmacist)  Their chronic conditions include: Hyperlipidemia, GERD, Hypothyroidism, Anxiety and BPH, Primary billiary cholangitis, Insomnia, hx neck/tongue SCC  Patient is a retired Film/video editor, retired from [employer] in 2020.  Office Visits: 07/20/20 Dr Ronnald Ramp OV: chronic f/u, labs stable, no med changes. Gave flu shot.  Consult Visit: 08/30/20 Dr Con[facility] tongue pain, negative exam, f/u prn. 02/21/20 Dr [facility] Hoes [facility] PSA low, low risk prostate Ca, no changes.  12/09/19 [facility]): PBH - rec'd DEXA scan to screen for osteoporosis.   No Known Allergies  Medications: Outpatient Encounter Medications as of 10/05/2020  Medication Sig  . atorvastatin (LIPITOR) 20 MG tablet Take 1 tablet (20 mg total) by mouth daily.  Marland Kitchen levothyroxine (SYNTHROID) 112 MCG tablet Take 1 tablet (112 mcg total) by mouth daily.  . sertraline (ZOLOFT) 50 MG tablet Take 2 tablets (100 mg total) by mouth daily.  . ursodiol (ACTIGALL) 250 MG tablet Take 2 tablets (500 mg total) by mouth in the morning and at bedtime.  . zaleplon (SONATA) 10 MG capsule Take 1 capsule (10 mg total) by mouth at bedtime as needed for sleep.   No facility-administered encounter medications on file as of 10/05/2020.    Wt Readings from Last 3 Encounters:  09/06/20 164 lb 6.4 oz (74.6 kg)  07/20/20 163 lb (73.9 kg)  07/19/19 164 lb (74.4 kg)   Lab  Results  Component Value Date   CREATININE 0.89 07/20/2020   BUN 15 07/20/2020   GFR 90.14 12/01/2019   GFRNONAA 86 07/20/2020   GFRAA 100 07/20/2020   NA 140 07/20/2020   K 4.7 07/20/2020   CALCIUM 9.9 07/20/2020   CO2 31 07/20/2020   BP Readings from Last 3 Encounters:  09/06/20 122/70  07/20/20 116/74  07/19/19 124/62    Current Diagnosis/Assessment:    Goals Addressed            This Visit's Progress   . Pharmacy Care Plan       CARE PLAN ENTRY (see longitudinal plan of care for additional care plan information)  Current Barriers:  . Chronic Disease Management support, education, and care coordination needs related to Hyperlipidemia, Prediabetes, Primary billiary cholangitis   Hyperlipidemia Lab Results  Component Value Date/Time   LDLCALC 91 07/20/2020 08:51 AM   LDLDIRECT 134.5 12/15/2006 09:00 AM .  Pharmacist Clinical Goal(s): o Over the next 365 days, patient will work with PharmD and providers to maintain LDL goal < 100 . Current regimen:  o Atorvastatin 20 mg daily . Interventions: o Discussed cholesterol goals and benefits of medications for prevention of heart attack / stroke . Patient self care activities - Over the next 180 days, patient will: o Continue medications  Prediabetes Lab Results  Component Value Date/Time   HGBA1C 5.9 (H) 07/20/2020 08:51 AM   HGBA1C 5.9 02/09/2020 12:00 AM   HGBA1C 6.0 07/19/2019 02:39 PM .  Pharmacist Clinical Goal(s): o Over the next 365 days,  patient will work with PharmD and providers to maintain A1c goal <6.5% . Current regimen:  o No medication . Interventions: o Discussed importance of maintaining sugars at goal to prevent complications of diabetes including kidney damage, retinal damage, and cardiovascular disease . Patient self care activities - Over the next 365 days, patient will: o Continue lifestyle management  Primary Billiary cholangitis  . Pharmacist Clinical Goal(s) o Over the next 365  days, patient will work with PharmD and providers to optimize therapy . Current regimen:  o Ursodiol 250 mg - 4 tablets daily . Interventions: o Discussed insurance coverage is optimal for 250 mg tablets rather than 500 mg . Patient self care activities - Over the next 365 days, patient will: o Continue medication  Medication management . Pharmacist Clinical Goal(s): o Over the next 365 days, patient will work with PharmD and providers to maintain optimal medication adherence . Current pharmacy: [facility] order . Interventions o Comprehensive medication review performed. o Continue current medication management strategy . Patient self care activities - Over the next 365 days, patient will: o Focus on medication adherence by fill date o Take medications as prescribed o Report any questions or concerns to PharmD and/or provider(s)  Initial goal documentation       Hyperlipidemia   LDL goal < 100  Last lipids Lab Results  Component Value Date   CHOL 156 07/20/2020   HDL 48 07/20/2020   LDLCALC 91 07/20/2020   LDLDIRECT 134.5 12/15/2006   TRIG 80 07/20/2020   CHOLHDL 3.3 07/20/2020   Hepatic Function Latest Ref Rng & Units 07/20/2020 02/09/2020 12/01/2019  Total Protein 6.1 - 8.1 g/dL 7.4 - 7.6  Albumin 3.5 - 5.0 - 4.4 4.4  AST 10 - 35 U/L 39(H) 54(A) 40(H)  ALT 9 - 46 U/L 34 41(A) 41  Alk Phosphatase 25 - 125 - 110 93  Total Bilirubin 0.2 - 1.2 mg/dL 0.5 - 0.5  Bilirubin, Direct 0.0 - 0.2 mg/dL 0.1 - -    The 10-year ASCVD risk score Mikey Bussing DC Jr., et al., 2013) is: 16.3%   Values used to calculate the score:     Age: 75 years     Sex: Male     Is Non-Hispanic African American: No     Diabetic: No     Tobacco smoker: No     Systolic Blood Pressure: 413 mmHg     Is BP treated: No     HDL Cholesterol: 48 mg/dL     Total Cholesterol: 156 mg/dL   Patient has failed these meds in past: rosuvastatin Patient is currently controlled on the following medications:   . Atorvastatin 20 mg daily  We discussed:  diet and exercise extensively; Cholesterol goals; benefits of statin for ASCVD risk reduction;  Plan  Continue current medications  Hypothyroidism   Lab Results  Component Value Date/Time   TSH 2.50 07/20/2020 08:51 AM   TSH 3.37 02/09/2020 12:00 AM   TSH 1.17 07/19/2019 02:39 PM   Patient has failed these meds in past: n/a Patient is currently controlled on the following medications:  . Levothyroxine 112 mcg daily  We discussed:  TSH is in range and pt denies symptoms of hypo- or hyperthyroidism  Plan  Continue current medications  Anxiety / Insomnia   Patient has failed these meds in past: n/a Patient is currently controlled on the following medications:  . Sertraline 50 mg - 2 tab daily . Zaleplon 10 mg HS prn  We discussed:  Patient is satisfied with current regimen and denies issues  Plan  Continue current medications  Primary Biliary cholangitis   Hepatic Function Latest Ref Rng & Units 07/20/2020 02/09/2020 12/01/2019  Total Protein 6.1 - 8.1 g/dL 7.4 - 7.6  Albumin 3.5 - 5.0 - 4.4 4.4  AST 10 - 35 U/L 39(H) 54(A) 40(H)  ALT 9 - 46 U/L 34 41(A) 41  Alk Phosphatase 25 - 125 - 110 93  Total Bilirubin 0.2 - 1.2 mg/dL 0.5 - 0.5  Bilirubin, Direct 0.0 - 0.2 mg/dL 0.1 - -   Patient has failed these meds in past: n/a Patient is currently controlled on the following medications:  . Ursodiol 250 mg - 2 tab BID  We discussed:  Patient is satisfied with current regimen and denies issues; medication is covered by insurance at 250 mg dose, not 500 mg  Plan  Continue current medications  Medication Management   Pt uses [facility] mail order pharmacy for all medications Uses pill box? Yes Pt endorses 100% compliance  We discussed: Current pharmacy is preferred with insurance plan and patient is satisfied with pharmacy services  Plan  Continue current medication management strategy    Follow up: 12 month phone  visit  Charlene Brooke, PharmD, BCACP Clinical Pharmacist [facility] at [facility] [phone]

## 2020-10-06 NOTE — Patient Instructions (Addendum)
Visit Information  Phone number for Pharmacist: [phone]  Thank you for meeting with me to discuss your medications! I look forward to working with you to achieve your health care goals. Below is a summary of what we talked about during the visit:  Goals Addressed            This Visit's Progress   . Pharmacy Care Plan       CARE PLAN ENTRY (see longitudinal plan of care for additional care plan information)  Current Barriers:  . Chronic Disease Management support, education, and care coordination needs related to Hyperlipidemia, Prediabetes, Primary billiary cholangitis   Hyperlipidemia Lab Results  Component Value Date/Time   LDLCALC 91 07/20/2020 08:51 AM   LDLDIRECT 134.5 12/15/2006 09:00 AM .  Pharmacist Clinical Goal(s): o Over the next 365 days, patient will work with PharmD and providers to maintain LDL goal < 100 . Current regimen:  o Atorvastatin 20 mg daily . Interventions: o Discussed cholesterol goals and benefits of medications for prevention of heart attack / stroke . Patient self care activities - Over the next 180 days, patient will: o Continue medications  Prediabetes Lab Results  Component Value Date/Time   HGBA1C 5.9 (H) 07/20/2020 08:51 AM   HGBA1C 5.9 02/09/2020 12:00 AM   HGBA1C 6.0 07/19/2019 02:39 PM .  Pharmacist Clinical Goal(s): o Over the next 365 days, patient will work with PharmD and providers to maintain A1c goal <6.5% . Current regimen:  o No medication . Interventions: o Discussed importance of maintaining sugars at goal to prevent complications of diabetes including kidney damage, retinal damage, and cardiovascular disease . Patient self care activities - Over the next 365 days, patient will: o Continue lifestyle management  Primary Billiary cholangitis  . Pharmacist Clinical Goal(s) o Over the next 365 days, patient will work with PharmD and providers to optimize therapy . Current regimen:  o Ursodiol 250 mg - 4 tablets  daily . Interventions: o Discussed insurance coverage is optimal for 250 mg tablets rather than 500 mg . Patient self care activities - Over the next 365 days, patient will: o Continue medication  Medication management . Pharmacist Clinical Goal(s): o Over the next 365 days, patient will work with PharmD and providers to maintain optimal medication adherence . Current pharmacy: [facility] order . Interventions o Comprehensive medication review performed. o Continue current medication management strategy . Patient self care activities - Over the next 365 days, patient will: o Focus on medication adherence by fill date o Take medications as prescribed o Report any questions or concerns to PharmD and/or provider(s)  Initial goal documentation      William Luna was given information about Chronic Care Management services today including:  1. CCM service includes personalized support from designated clinical staff supervised by his physician, including individualized plan of care and coordination with other care providers 2. 24/7 contact phone numbers for assistance for urgent and routine care needs. 3. Standard insurance, coinsurance, copays and deductibles apply for chronic care management only during months in which we provide at least 20 minutes of these services. Most insurances cover these services at 100%, however patients may be responsible for any copay, coinsurance and/or deductible if applicable. This service may help you avoid the need for more expensive face-to-face services. 4. Only one practitioner may furnish and bill the service in a calendar month. 5. The patient may stop CCM services at any time (effective at the end of the month) by phone call to  the office staff.  Patient agreed to services and verbal consent obtained.   The patient verbalized understanding of instructions, educational materials, and care plan provided today and declined offer to receive copy of patient  instructions, educational materials, and care plan.  Telephone follow up appointment with pharmacy team member scheduled for:1 year  Charlene Brooke, PharmD, BCACP Clinical Pharmacist [facility] at [place] [facility] After Age 88 After age 49, you are at a higher risk for certain long-term diseases and infections as well as injuries from falls. Falls are a major cause of broken bones and head injuries in people who are older than age 44. Getting regular preventive care can help to keep you healthy and well. Preventive care includes getting regular testing and making lifestyle changes as recommended by your health care provider. Talk with your health care provider about:  Which screenings and tests you should have. A screening is a test that checks for a disease when you have no symptoms.  A diet and exercise plan that is right for you. What should I know about screenings and tests to prevent falls? Screening and testing are the best ways to find a health problem early. Early diagnosis and treatment give you the best chance of managing medical conditions that are common after age 31. Certain conditions and lifestyle choices may make you more likely to have a fall. Your health care provider may recommend:  Regular vision checks. Poor vision and conditions such as cataracts can make you more likely to have a fall. If you wear glasses, make sure to get your prescription updated if your vision changes.  Medicine review. Work with your health care provider to regularly review all of the medicines you are taking, including over-the-counter medicines. Ask your health care provider about any side effects that may make you more likely to have a fall. Tell your health care provider if any medicines that you take make you feel dizzy or sleepy.  Osteoporosis screening. Osteoporosis is a condition that causes the bones to get weaker. This can make the bones weak and  cause them to break more easily.  Blood pressure screening. Blood pressure changes and medicines to control blood pressure can make you feel dizzy.  Strength and balance checks. Your health care provider may recommend certain tests to check your strength and balance while standing, walking, or changing positions.  Foot health exam. Foot pain and numbness, as well as not wearing proper footwear, can make you more likely to have a fall.  Depression screening. You may be more likely to have a fall if you have a fear of falling, feel emotionally low, or feel unable to do activities that you used to do.  Alcohol use screening. Using too much alcohol can affect your balance and may make you more likely to have a fall. What actions can I take to lower my risk of falls? General instructions  Talk with your health care provider about your risks for falling. Tell your health care provider if: ? You fall. Be sure to tell your health care provider about all falls, even ones that seem minor. ? You feel dizzy, sleepy, or off-balance.  Take over-the-counter and prescription medicines only as told by your health care provider. These include any supplements.  Eat a healthy diet and maintain a healthy weight. A healthy diet includes low-fat dairy products, low-fat (lean) meats, and fiber from whole grains, beans, and lots of fruits and vegetables. Home safety  Remove any tripping hazards, such as rugs, cords, and clutter.  Install safety equipment such as grab bars in bathrooms and safety rails on stairs.  Keep rooms and walkways well-lit. Activity   Follow a regular exercise program to stay fit. This will help you maintain your balance. Ask your health care provider what types of exercise are appropriate for you.  If you need a cane or walker, use it as recommended by your health care provider.  Wear supportive shoes that have nonskid soles. Lifestyle  Do not drink alcohol if your health care  provider tells you not to drink.  If you drink alcohol, limit how much you have: ? 0-1 drink a day for women. ? 0-2 drinks a day for men.  Be aware of how much alcohol is in your drink. In the [place] one drink equals one typical bottle of beer (12 oz), one-half glass of wine (5 oz), or one shot of hard liquor (1 oz).  Do not use any products that contain nicotine or tobacco, such as cigarettes and e-cigarettes. If you need help quitting, ask your health care provider. Summary  Having a healthy lifestyle and getting preventive care can help to protect your health and wellness after age 16.  Screening and testing are the best way to find a health problem early and help you avoid having a fall. Early diagnosis and treatment give you the best chance for managing medical conditions that are more common for people who are older than age 39.  Falls are a major cause of broken bones and head injuries in people who are older than age 28. Take precautions to prevent a fall at home.  Work with your health care provider to learn what changes you can make to improve your health and wellness and to prevent falls. This information is not intended to replace advice given to you by your health care provider. Make sure you discuss any questions you have with your health care provider. Document Revised: 02/25/2019 Document Reviewed: 09/17/2017 Elsevier Patient Education  2020 [employer].

## 2020-10-06 NOTE — Addendum Note (Signed)
Addended by: Hinda Kehr on: 10/06/2020 08:48 AM   Modules accepted: Orders

## 2020-10-10 ENCOUNTER — Telehealth: Payer: Self-pay | Admitting: Internal Medicine

## 2020-10-10 NOTE — Telephone Encounter (Signed)
error 

## 2020-11-13 ENCOUNTER — Other Ambulatory Visit (INDEPENDENT_AMBULATORY_CARE_PROVIDER_SITE_OTHER): Payer: Medicare Other

## 2020-11-13 ENCOUNTER — Telehealth: Payer: Self-pay | Admitting: Internal Medicine

## 2020-11-13 DIAGNOSIS — R112 Nausea with vomiting, unspecified: Secondary | ICD-10-CM

## 2020-11-13 DIAGNOSIS — K743 Primary biliary cirrhosis: Secondary | ICD-10-CM

## 2020-11-13 DIAGNOSIS — K912 Postsurgical malabsorption, not elsewhere classified: Secondary | ICD-10-CM

## 2020-11-13 DIAGNOSIS — R1013 Epigastric pain: Secondary | ICD-10-CM

## 2020-11-13 LAB — COMPREHENSIVE METABOLIC PANEL
ALT: 48 U/L (ref 0–53)
AST: 48 U/L — ABNORMAL HIGH (ref 0–37)
Albumin: 4.3 g/dL (ref 3.5–5.2)
Alkaline Phosphatase: 112 U/L (ref 39–117)
BUN: 17 mg/dL (ref 6–23)
CO2: 29 mEq/L (ref 19–32)
Calcium: 9.5 mg/dL (ref 8.4–10.5)
Chloride: 102 mEq/L (ref 96–112)
Creatinine, Ser: 0.97 mg/dL (ref 0.40–1.50)
GFR: 78.43 mL/min (ref >60.00)
Glucose, Bld: 136 mg/dL — ABNORMAL HIGH (ref 70–99)
Potassium: 4 mEq/L (ref 3.5–5.1)
Sodium: 137 mEq/L (ref 135–145)
Total Bilirubin: 0.7 mg/dL (ref 0.2–1.2)
Total Protein: 7.7 g/dL (ref 6.0–8.3)

## 2020-11-13 LAB — CBC
HCT: 42.3 % (ref 39.0–52.0)
Hemoglobin: 14.1 g/dL (ref 13.0–17.0)
MCHC: 33.4 g/dL (ref 30.0–36.0)
MCV: 87.3 fl (ref 78.0–100.0)
Platelets: 210 10*3/uL (ref 150.0–400.0)
RBC: 4.84 Mil/uL (ref 4.22–5.81)
RDW: 13.9 % (ref 11.5–15.5)
WBC: 8.6 10*3/uL (ref 4.0–10.5)

## 2020-11-13 LAB — AMYLASE: Amylase: 62 U/L (ref 27–131)

## 2020-11-13 LAB — VITAMIN D 25 HYDROXY (VIT D DEFICIENCY, FRACTURES): VITD: 41.15 ng/mL (ref 30.00–100.00)

## 2020-11-13 LAB — PROTIME-INR
INR: 1.2 ratio — ABNORMAL HIGH (ref 0.8–1.0)
Prothrombin Time: 12.9 s (ref 9.6–13.1)

## 2020-11-13 LAB — GAMMA GT: GGT: 159 U/L — ABNORMAL HIGH (ref 7–51)

## 2020-11-13 LAB — LIPASE: Lipase: 47 U/L (ref 11.0–59.0)

## 2020-11-13 NOTE — Telephone Encounter (Signed)
Patient has had 2 episodes of epigastric pain that are postprandial, lasting for hours.  Vomited with each episode.  There was an episode on 13 December and then again on the 25th.  He tried Bentyl after several hours of pain on the 13th and he think it helped.  No fever or diarrhea.  The following day each time he was a little sore as if he had a virus but no fevers or other symptoms and then the symptoms resolved.  No other symptoms feels well at this time.  He has a close friend who is a gastroenterologist they trained together and Dr. Ron Parker is wondering about getting some labs and an ultrasound which I think are reasonable.  I will also order his [place] labs which are due in January.   Orders Placed This Encounter  Procedures  . Comp Met (CMET)  . CBC  . Lipase  . Amylase  . INR/PT  . Gamma GT  . Vitamin D (25 hydroxy)  . Vitamin A  . Vitamin E   Labs have all been ordered.  He knows to come in today to get these done.  I explained that my nursing staff would schedule him for a complete abdominal ultrasound regarding epigastric pain and nausea and vomiting.  He is traveling on January 4.  Hopefully we can get that done this week though I explained to him the way things have been scheduled lately it might not be possible to get the ultrasound done.

## 2020-11-13 NOTE — Telephone Encounter (Signed)
Good morning Dr. Leone Payor, patient called requesting a call back from you.  He is aware you are doing procedures all day today.  Says if you can return his call later on today please.

## 2020-11-13 NOTE — Telephone Encounter (Signed)
Patient notified of the [place] scheduled at [facility] radiology for 11/16/20 6:45 arrival for 7:00 and NPO after midnight

## 2020-11-13 NOTE — Addendum Note (Signed)
Addended by: Vincenza Hews on: 11/13/2020 02:36 PM   Modules accepted: Orders

## 2020-11-13 NOTE — Addendum Note (Signed)
Addended by: Keirah Konitzer on: 11/13/2020 02:36 PM   Modules accepted: Orders  

## 2020-11-14 ENCOUNTER — Other Ambulatory Visit (INDEPENDENT_AMBULATORY_CARE_PROVIDER_SITE_OTHER): Payer: Medicare Other

## 2020-11-14 DIAGNOSIS — R7401 Elevation of levels of liver transaminase levels: Secondary | ICD-10-CM

## 2020-11-14 LAB — CK: Total CK: 82 U/L (ref 7–232)

## 2020-11-16 ENCOUNTER — Other Ambulatory Visit: Payer: Self-pay

## 2020-11-16 ENCOUNTER — Other Ambulatory Visit: Payer: Self-pay | Admitting: Internal Medicine

## 2020-11-16 ENCOUNTER — Ambulatory Visit (HOSPITAL_COMMUNITY)
Admission: RE | Admit: 2020-11-16 | Discharge: 2020-11-16 | Disposition: A | Discharge status: Home / Self Care | Payer: Medicare Other | Source: Ambulatory Visit | Attending: Internal Medicine | Admitting: Internal Medicine

## 2020-11-16 DIAGNOSIS — R1013 Epigastric pain: Secondary | ICD-10-CM | POA: Diagnosis not present

## 2020-11-16 DIAGNOSIS — K743 Primary biliary cirrhosis: Secondary | ICD-10-CM | POA: Insufficient documentation

## 2020-11-16 DIAGNOSIS — K912 Postsurgical malabsorption, not elsewhere classified: Secondary | ICD-10-CM | POA: Diagnosis not present

## 2020-11-16 DIAGNOSIS — R112 Nausea with vomiting, unspecified: Secondary | ICD-10-CM | POA: Insufficient documentation

## 2020-11-16 MED ORDER — DICYCLOMINE HCL 20 MG PO TABS: 20.0000 mg | ORAL_TABLET | Freq: Four times a day (QID) | ORAL | 0 refills | Status: DC | PRN

## 2020-11-19 LAB — VITAMIN E
Gamma-Tocopherol (Vit E): <1 mg/L (ref <=4.3)
Vitamin E (Alpha Tocopherol): 10.3 mg/L (ref 5.7–19.9)

## 2020-11-19 LAB — VITAMIN A: Vitamin A (Retinoic Acid): 47 ug/dL (ref 38–98)

## 2020-12-04 ENCOUNTER — Other Ambulatory Visit: Payer: Self-pay | Admitting: Internal Medicine

## 2020-12-04 DIAGNOSIS — E785 Hyperlipidemia, unspecified: Secondary | ICD-10-CM

## 2020-12-25 NOTE — Patient Instructions (Addendum)
DUE TO COVID-19 ONLY ONE VISITOR IS ALLOWED TO COME WITH YOU AND STAY IN THE WAITING ROOM ONLY DURING PRE OP AND PROCEDURE DAY OF SURGERY. THE 1 VISITOR  MAY VISIT WITH YOU AFTER SURGERY IN YOUR PRIVATE ROOM DURING VISITING HOURS ONLY!  YOU NEED TO HAVE A COVID 19 TEST ON_2/15______ @_2 :00______, THIS TEST MUST BE DONE BEFORE SURGERY,  COVID TESTING SITE [place]  [address], IT IS ON THE RIGHT GOING OUT [address] APPROXIMATELY  2 MINUTES PAST [employer] ON THE RIGHT. ONCE YOUR COVID TEST IS COMPLETED,  PLEASE BEGIN THE QUARANTINE INSTRUCTIONS AS OUTLINED IN YOUR HANDOUT.                Carlena Bjornstad, MD    Your procedure is scheduled on: 01/05/21   Report to [facility] Main  Entrance   Report to admitting at  9:45 AM     Call this number if you have problems the morning of surgery [phone]   . BRUSH YOUR TEETH MORNING OF SURGERY AND RINSE YOUR MOUTH OUT, NO CHEWING GUM CANDY OR MINTS.     Take these medicines the morning of surgery with A SIP OF WATER: Zoloft, Levothyroxine                                 You may not have any metal on your body including               piercings  Do not wear jewelry, lotions, powders or deodorant                         Men may shave face and neck.   Do not bring valuables to the hospital. [place].  Contacts, dentures or bridgework may not be worn into surgery.       Patients discharged the day of surgery will not be allowed to drive home.   IF YOU ARE HAVING SURGERY AND GOING HOME THE SAME DAY, YOU MUST HAVE AN ADULT TO DRIVE YOU HOME AND BE WITH YOU FOR 24 HOURS.  YOU MAY GO HOME BY TAXI OR UBER OR ORTHERWISE, BUT AN ADULT MUST ACCOMPANY YOU HOME AND STAY WITH YOU FOR 24 HOURS.  Name and phone number of your driver:  Special Instructions: N/A              Please read over the following fact sheets you were  given: _____________________________________________________________________             [facility] - Preparing for Surgery Before surgery, you can play an important role.  Because skin is not sterile, your skin needs to be as free of germs as possible.  You can reduce the number of germs on your skin by washing with CHG (chlorahexidine gluconate) soap before surgery.  CHG is an antiseptic cleaner which kills germs and bonds with the skin to continue killing germs even after washing. Please DO NOT use if you have an allergy to CHG or antibacterial soaps.  If your skin becomes reddened/irritated stop using the CHG and inform your nurse when you arrive at Short Stay. r.  You may shave your face/neck.  Please follow these instructions carefully:  1.  Shower with CHG Soap the night before  surgery and the  morning of Surgery.  2.  If you choose to wash your hair, wash your hair first as usual with your  normal  shampoo.  3.  After you shampoo, rinse your hair and body thoroughly to remove the  shampoo.                                        4.  Use CHG as you would any other liquid soap.  You can apply chg directly  to the skin and wash                       Gently with a scrungie or clean washcloth.  5.  Apply the CHG Soap to your body ONLY FROM THE NECK DOWN.   Do not use on face/ open                           Wound or open sores. Avoid contact with eyes, ears mouth and genitals (private parts).                       Wash face,  Genitals (private parts) with your normal soap.             6.  Wash thoroughly, paying special attention to the area where your surgery  will be performed.  7.  Thoroughly rinse your body with warm water from the neck down.  8.  DO NOT shower/wash with your normal soap after using and rinsing off  the CHG Soap.             9.  Pat yourself dry with a clean towel.            10.  Wear clean pajamas.            11.  Place clean sheets on your bed the night of your  first shower and do not  sleep with pets. Day of Surgery : Do not apply any lotions/deodorants the morning of surgery.  Please wear clean clothes to the hospital/surgery center.  FAILURE TO FOLLOW THESE INSTRUCTIONS MAY RESULT IN THE CANCELLATION OF YOUR SURGERY PATIENT SIGNATURE_________________________________  NURSE SIGNATURE__________________________________  ________________________________________________________________________

## 2020-12-26 ENCOUNTER — Encounter (HOSPITAL_COMMUNITY)
Admission: RE | Admit: 2020-12-26 | Discharge: 2020-12-26 | Disposition: A | Discharge status: Home / Self Care | Payer: Medicare Other | Source: Ambulatory Visit | Attending: Surgery | Admitting: Surgery

## 2020-12-26 ENCOUNTER — Encounter (HOSPITAL_COMMUNITY): Payer: Self-pay

## 2020-12-26 ENCOUNTER — Other Ambulatory Visit: Payer: Self-pay

## 2020-12-26 DIAGNOSIS — Z01812 Encounter for preprocedural laboratory examination: Secondary | ICD-10-CM | POA: Insufficient documentation

## 2020-12-26 DIAGNOSIS — H2511 Age-related nuclear cataract, right eye: Secondary | ICD-10-CM | POA: Diagnosis not present

## 2020-12-26 LAB — CBC
HCT: 46.4 % (ref 39.0–52.0)
Hemoglobin: 15.4 g/dL (ref 13.0–17.0)
MCH: 29.4 pg (ref 26.0–34.0)
MCHC: 33.2 g/dL (ref 30.0–36.0)
MCV: 88.5 fL (ref 80.0–100.0)
Platelets: 182 10*3/uL (ref 150–400)
RBC: 5.24 MIL/uL (ref 4.22–5.81)
RDW: 13.2 % (ref 11.5–15.5)
WBC: 6.2 10*3/uL (ref 4.0–10.5)
nRBC: 0 % (ref 0.0–0.2)

## 2020-12-26 LAB — COMPREHENSIVE METABOLIC PANEL
ALT: 55 U/L — ABNORMAL HIGH (ref 0–44)
AST: 67 U/L — ABNORMAL HIGH (ref 15–41)
Albumin: 4.4 g/dL (ref 3.5–5.0)
Alkaline Phosphatase: 81 U/L (ref 38–126)
Anion gap: 12 (ref 5–15)
BUN: 16 mg/dL (ref 8–23)
CO2: 25 mmol/L (ref 22–32)
Calcium: 10 mg/dL (ref 8.9–10.3)
Chloride: 102 mmol/L (ref 98–111)
Creatinine, Ser: 0.72 mg/dL (ref 0.61–1.24)
GFR, Estimated: >60 mL/min (ref >60)
Glucose, Bld: 111 mg/dL — ABNORMAL HIGH (ref 70–99)
Potassium: 4.3 mmol/L (ref 3.5–5.1)
Sodium: 139 mmol/L (ref 135–145)
Total Bilirubin: 0.9 mg/dL (ref 0.3–1.2)
Total Protein: 8.1 g/dL (ref 6.5–8.1)

## 2020-12-26 MED ORDER — CHLORHEXIDINE GLUCONATE 0.12 % MT SOLN: 15.0000 mL | Freq: Once | OROMUCOSAL | Status: DC

## 2020-12-26 MED ORDER — ORAL CARE MOUTH RINSE
15.0000 mL | Freq: Once | OROMUCOSAL | Status: DC
  Filled 2020-12-26: qty 15

## 2020-12-26 MED ORDER — LACTATED RINGERS IV SOLN: 1000.0000 mL | INTRAVENOUS | Status: DC

## 2020-12-26 NOTE — Progress Notes (Signed)
COVID Vaccine Completed:Yes Date COVID Vaccine completed:12/22/19-Booster 08/16/20 COVID vaccine manufacturer: Pfizer      PCP - Dr. Alona Bene Cardiologist - no  Chest x-ray - no EKG - no Stress Test - no ECHO - no Cardiac Cath - no Pacemaker/ICD device last checked:NA  Sleep Study - no CPAP -   Fasting Blood Sugar - NA Checks Blood Sugar _____ times a day  Blood Thinner Instructions:NA Aspirin Instructions: Last Dose:  Anesthesia review:   Patient denies shortness of breath, fever, cough and chest pain at PAT appointment yes  Patient verbalized understanding of instructions that were given to them at the PAT appointment. Patient was also instructed that they will need to review over the PAT instructions again at home before surgery. Yes Pt has an active lifestyle and works out. He has no SOB with any activities.  He has a C5-6-C6-7 fusion and has limited ROM in his neck but no problems with intubation.

## 2021-01-02 ENCOUNTER — Other Ambulatory Visit (HOSPITAL_COMMUNITY)
Admission: RE | Admit: 2021-01-02 | Discharge: 2021-01-02 | Disposition: A | Discharge status: Home / Self Care | Payer: Medicare Other | Source: Ambulatory Visit | Attending: Surgery | Admitting: Surgery

## 2021-01-02 DIAGNOSIS — Z01812 Encounter for preprocedural laboratory examination: Secondary | ICD-10-CM | POA: Diagnosis not present

## 2021-01-02 DIAGNOSIS — Z20822 Contact with and (suspected) exposure to covid-19: Secondary | ICD-10-CM | POA: Diagnosis not present

## 2021-01-03 LAB — SARS CORONAVIRUS 2 (TAT 6-24 HRS): SARS Coronavirus 2: NEGATIVE

## 2021-01-03 NOTE — H&P (Signed)
William Luna Location: [facility] Patient #: [id number] DOB: 1951-07-30 Married / Language: English / Race: White Male   History of Present Illness  The patient is a 70 year old male who presents for evaluation of gall stones. William Luna has been followed at [facility] by William Luna in hepatology and locally by William Luna. He has been diagnosed with primary biliary cholangitis. He has had a neck fusion, a right neck dissection and partial glossectomy for squamous cell cancer (followed now by William Luna), hypothyroidism on Synthroid and is otherwise fit and wanting to ski.   He has had mulitiple attacks of upper abdominal pressure and discomfort around Christmas. It was pretty bad and he toughed it out rather than sit in the ER.   His ultrasound showed new circumferential gallbaldder wall thickening that is new since 2019. No big stones but these could be small stones.   I discussed lap chole with him in detail. He wants, his liver special wants, and I agree to remove his gallbladder.    Past Surgical History William Luna, William Luna; 12/14/2020 10:19 Luna) Cataract Surgery  Bilateral. Colon Polyp Removal - Colonoscopy  Oral Surgery  Spinal Surgery - Lower Back  Spinal Surgery - Neck   Diagnostic Studies History (William Luna; 12/14/2020 10:19 Luna) Colonoscopy  1-5 years ago  Allergies (William Luna; 12/14/2020 10:20 Luna) No Known Drug Allergies  [12/14/2020]: Allergies Reconciled   Medication History (William Luna; 12/14/2020 10:20 Luna) Atorvastatin Calcium (20MG  Tablet, Oral) Active. Levothyroxine Sodium (112MCG Tablet, Oral) Active. Sertraline HCl (50MG  Tablet, Oral) Active. Ursodiol (250MG  Tablet, Oral) Active. ZyrTEC (10MG  Tablet, Oral) Active. Medications Reconciled  Social History William Luna, Luna; 12/14/2020 10:19 Luna) Alcohol use  Occasional alcohol use. Caffeine use  Coffee. No drug use  Tobacco use  Never smoker.  Family History  William Luna, William Luna; 12/14/2020 10:19 Luna) Cerebrovascular Accident  Father. Colon Cancer  Family Members In General. Diabetes Mellitus  Mother. Heart Disease  Father. Hypertension  Father.  Other Problems (William Luna; 12/14/2020 10:19 Luna) Cancer  Enlarged Prostate  Gastroesophageal Reflux Disease  Other disease, cancer, significant illness  Thyroid Disease     Review of Systems (William Luna) General Not Present- Appetite Loss, Chills, Fatigue, Fever, Night Sweats, Weight Gain and Weight Loss. Skin Not Present- Change in Wart/Mole, Dryness, Hives, Jaundice, New Lesions, Non-Healing Wounds, Rash and Ulcer. HEENT Not Present- Earache, Hearing Loss, Hoarseness, Nose Bleed, Oral Ulcers, Ringing in the Ears, Seasonal Allergies, Sinus Pain, Sore Throat, Visual Disturbances, Wears glasses/contact lenses and Yellow Eyes. Respiratory Not Present- Bloody sputum, Chronic Cough, Difficulty Breathing, Snoring and Wheezing. Breast Not Present- Breast Mass, Breast Pain, Nipple Discharge and Skin Changes. Cardiovascular Not Present- Chest Pain, Difficulty Breathing Lying Down, Leg Cramps, Palpitations, Rapid Heart Rate, Shortness of Breath and Swelling of Extremities. Gastrointestinal Present- Abdominal Pain and Bloating. Not Present- Bloody Stool, Change in Bowel Habits, Chronic diarrhea, Constipation, Difficulty Swallowing, Excessive gas, Gets full quickly at meals, Hemorrhoids, Indigestion, Nausea, Rectal Pain and Vomiting. Male Genitourinary Not Present- Blood in Urine, Change in Urinary Stream, Frequency, Impotence, Nocturia, Painful Urination, Urgency and Urine Leakage. Musculoskeletal Not Present- Back Pain, Joint Pain, Joint Stiffness, Muscle Pain, Muscle Weakness and Swelling of Extremities. Neurological Not Present- Decreased Memory, Fainting, Headaches, Numbness, Seizures, Tingling, Tremor, Trouble walking and Weakness. Psychiatric Not Present- Anxiety,  Bipolar, Change in Sleep Pattern, Depression, Fearful and Frequent crying. Endocrine Not Present- Cold Intolerance, Excessive Hunger, Hair Changes, Heat Intolerance,  Hot flashes and New Diabetes. Hematology Not Present- Blood Thinners, Easy Bruising, Excessive bleeding, Gland problems, HIV and Persistent Infections.  Vitals (William Nolan Luna; 12/14/2020 10:21 Luna) 12/14/2020 10:20 Luna Weight: 169 lb Height: 68in Body Surface Area: 1.9 m Body Mass Index: 25.7 kg/m  Temp.: 97.20F  Pulse: 71 (Regular)  BP: 120/72(Sitting, Left Arm, Standard)       Physical Exam (William Luna; 12/14/2020 11:24 Luna) General Note: Well maintained WM NAD HEENT unremarkable for icterus Neck-right neck dissection for SSC of the tongue Chest clear Heart SR Abdomen full and nontender. 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 He needs this removed very soon.   William Luna

## 2021-01-04 MED ORDER — BUPIVACAINE LIPOSOME 1.3 % IJ SUSP
20.0000 mL | Freq: Once | INTRAMUSCULAR | Status: DC
  Filled 2021-01-04: qty 20

## 2021-01-05 ENCOUNTER — Ambulatory Visit (HOSPITAL_COMMUNITY): Payer: Medicare Other | Admitting: Physician Assistant

## 2021-01-05 ENCOUNTER — Encounter (HOSPITAL_COMMUNITY)
Admission: RE | Disposition: A | Discharge status: Home / Self Care | Payer: Self-pay | Source: Home / Self Care | Attending: Surgery

## 2021-01-05 ENCOUNTER — Ambulatory Visit (HOSPITAL_COMMUNITY)
Admission: RE | Admit: 2021-01-05 | Discharge: 2021-01-05 | Disposition: A | Discharge status: Home / Self Care | Payer: Medicare Other | Attending: Surgery | Admitting: Surgery

## 2021-01-05 ENCOUNTER — Encounter (HOSPITAL_COMMUNITY): Payer: Self-pay | Admitting: Surgery

## 2021-01-05 ENCOUNTER — Ambulatory Visit (HOSPITAL_COMMUNITY): Payer: Medicare Other

## 2021-01-05 ENCOUNTER — Ambulatory Visit (HOSPITAL_COMMUNITY): Payer: Medicare Other | Admitting: Anesthesiology

## 2021-01-05 DIAGNOSIS — Z85810 Personal history of malignant neoplasm of tongue: Secondary | ICD-10-CM | POA: Insufficient documentation

## 2021-01-05 DIAGNOSIS — Z419 Encounter for procedure for purposes other than remedying health state, unspecified: Secondary | ICD-10-CM

## 2021-01-05 DIAGNOSIS — M5412 Radiculopathy, cervical region: Secondary | ICD-10-CM | POA: Diagnosis not present

## 2021-01-05 DIAGNOSIS — E039 Hypothyroidism, unspecified: Secondary | ICD-10-CM | POA: Insufficient documentation

## 2021-01-05 DIAGNOSIS — K8010 Calculus of gallbladder with chronic cholecystitis without obstruction: Secondary | ICD-10-CM | POA: Diagnosis not present

## 2021-01-05 DIAGNOSIS — K811 Chronic cholecystitis: Secondary | ICD-10-CM | POA: Diagnosis not present

## 2021-01-05 DIAGNOSIS — Z981 Arthrodesis status: Secondary | ICD-10-CM | POA: Diagnosis not present

## 2021-01-05 DIAGNOSIS — K743 Primary biliary cirrhosis: Secondary | ICD-10-CM | POA: Insufficient documentation

## 2021-01-05 DIAGNOSIS — K219 Gastro-esophageal reflux disease without esophagitis: Secondary | ICD-10-CM | POA: Diagnosis not present

## 2021-01-05 DIAGNOSIS — E785 Hyperlipidemia, unspecified: Secondary | ICD-10-CM | POA: Diagnosis not present

## 2021-01-05 DIAGNOSIS — R590 Localized enlarged lymph nodes: Secondary | ICD-10-CM | POA: Diagnosis not present

## 2021-01-05 DIAGNOSIS — Z4889 Encounter for other specified surgical aftercare: Secondary | ICD-10-CM | POA: Diagnosis not present

## 2021-01-05 HISTORY — PX: CHOLECYSTECTOMY: SHX55

## 2021-01-05 SURGERY — LAPAROSCOPIC CHOLECYSTECTOMY WITH INTRAOPERATIVE CHOLANGIOGRAM
Anesthesia: General | Site: Abdomen

## 2021-01-05 MED ORDER — RINGERS IRRIGATION IR SOLN
Status: DC | PRN
  Administered 2021-01-05: 1000 mL

## 2021-01-05 MED ORDER — FENTANYL CITRATE (PF) 250 MCG/5ML IJ SOLN
INTRAMUSCULAR | Status: DC | PRN
  Administered 2021-01-05: 50 ug via INTRAVENOUS
  Administered 2021-01-05: 100 ug via INTRAVENOUS

## 2021-01-05 MED ORDER — IOPAMIDOL (ISOVUE-300) INJECTION 61%
INTRAVENOUS | Status: DC | PRN
  Administered 2021-01-05: 2.5 mL

## 2021-01-05 MED ORDER — BUPIVACAINE LIPOSOME 1.3 % IJ SUSP
INTRAMUSCULAR | Status: DC | PRN
  Administered 2021-01-05: 20 mL

## 2021-01-05 MED ORDER — PROMETHAZINE HCL 25 MG/ML IJ SOLN: 6.2500 mg | INTRAMUSCULAR | Status: DC | PRN

## 2021-01-05 MED ORDER — ORAL CARE MOUTH RINSE: 15.0000 mL | Freq: Once | OROMUCOSAL | Status: AC

## 2021-01-05 MED ORDER — LIDOCAINE HCL (PF) 2 % IJ SOLN
INTRAMUSCULAR | Status: DC | PRN
  Administered 2021-01-05: 10 mg/kg/h via INTRADERMAL

## 2021-01-05 MED ORDER — ONDANSETRON HCL 4 MG/2ML IJ SOLN
INTRAMUSCULAR | Status: AC
  Filled 2021-01-05: qty 2

## 2021-01-05 MED ORDER — OXYCODONE HCL 5 MG PO TABS
ORAL_TABLET | ORAL | Status: AC
  Filled 2021-01-05: qty 1

## 2021-01-05 MED ORDER — ROCURONIUM BROMIDE 10 MG/ML (PF) SYRINGE
PREFILLED_SYRINGE | INTRAVENOUS | Status: AC
  Filled 2021-01-05: qty 10

## 2021-01-05 MED ORDER — FENTANYL CITRATE (PF) 100 MCG/2ML IJ SOLN
INTRAMUSCULAR | Status: AC
  Filled 2021-01-05: qty 2

## 2021-01-05 MED ORDER — OXYCODONE HCL 5 MG/5ML PO SOLN: 5.0000 mg | Freq: Once | ORAL | Status: DC | PRN

## 2021-01-05 MED ORDER — MIDAZOLAM HCL 2 MG/2ML IJ SOLN
INTRAMUSCULAR | Status: AC
  Filled 2021-01-05: qty 2

## 2021-01-05 MED ORDER — ACETAMINOPHEN 500 MG PO TABS: 1000.0000 mg | ORAL_TABLET | Freq: Once | ORAL | Status: DC

## 2021-01-05 MED ORDER — ONDANSETRON HCL 4 MG/2ML IJ SOLN
INTRAMUSCULAR | Status: DC | PRN
  Administered 2021-01-05: 4 mg via INTRAVENOUS

## 2021-01-05 MED ORDER — CHLORHEXIDINE GLUCONATE CLOTH 2 % EX PADS: 6.0000 | MEDICATED_PAD | Freq: Once | CUTANEOUS | Status: DC

## 2021-01-05 MED ORDER — FENTANYL CITRATE (PF) 100 MCG/2ML IJ SOLN
INTRAMUSCULAR | Status: AC
  Administered 2021-01-05: 25 ug via INTRAVENOUS
  Filled 2021-01-05: qty 2

## 2021-01-05 MED ORDER — HEPARIN SODIUM (PORCINE) 5000 UNIT/ML IJ SOLN
5000.0000 [IU] | Freq: Once | INTRAMUSCULAR | Status: AC
  Administered 2021-01-05: 5000 [IU] via SUBCUTANEOUS
  Filled 2021-01-05: qty 1

## 2021-01-05 MED ORDER — DEXAMETHASONE SODIUM PHOSPHATE 10 MG/ML IJ SOLN
INTRAMUSCULAR | Status: AC
  Filled 2021-01-05: qty 1

## 2021-01-05 MED ORDER — OXYCODONE HCL 5 MG PO TABS: 5.0000 mg | ORAL_TABLET | Freq: Four times a day (QID) | ORAL | 0 refills | Status: DC | PRN

## 2021-01-05 MED ORDER — ACETAMINOPHEN 500 MG PO TABS
1000.0000 mg | ORAL_TABLET | ORAL | Status: AC
  Administered 2021-01-05: 1000 mg via ORAL
  Filled 2021-01-05: qty 2

## 2021-01-05 MED ORDER — CHLORHEXIDINE GLUCONATE 0.12 % MT SOLN
15.0000 mL | Freq: Once | OROMUCOSAL | Status: AC
  Administered 2021-01-05: 15 mL via OROMUCOSAL

## 2021-01-05 MED ORDER — LACTATED RINGERS IV SOLN: INTRAVENOUS | Status: DC

## 2021-01-05 MED ORDER — PROPOFOL 10 MG/ML IV BOLUS
INTRAVENOUS | Status: DC | PRN
Start: 2021-01-05 — End: 2021-01-05
  Administered 2021-01-05: 180 mg via INTRAVENOUS
  Administered 2021-01-05: 20 mg via INTRAVENOUS

## 2021-01-05 MED ORDER — SCOPOLAMINE 1 MG/3DAYS TD PT72
1.0000 | MEDICATED_PATCH | TRANSDERMAL | Status: DC
  Administered 2021-01-05: 1.5 mg via TRANSDERMAL
  Filled 2021-01-05: qty 1

## 2021-01-05 MED ORDER — SUGAMMADEX SODIUM 200 MG/2ML IV SOLN
INTRAVENOUS | Status: DC | PRN
  Administered 2021-01-05: 300 mg via INTRAVENOUS
  Administered 2021-01-05: 100 mg via INTRAVENOUS

## 2021-01-05 MED ORDER — DEXAMETHASONE SODIUM PHOSPHATE 10 MG/ML IJ SOLN
INTRAMUSCULAR | Status: DC | PRN
  Administered 2021-01-05: 10 mg via INTRAVENOUS

## 2021-01-05 MED ORDER — FENTANYL CITRATE (PF) 100 MCG/2ML IJ SOLN
25.0000 ug | INTRAMUSCULAR | Status: DC | PRN
  Administered 2021-01-05: 25 ug via INTRAVENOUS

## 2021-01-05 MED ORDER — LIDOCAINE 2% (20 MG/ML) 5 ML SYRINGE
INTRAMUSCULAR | Status: DC | PRN
  Administered 2021-01-05: 60 mg via INTRAVENOUS

## 2021-01-05 MED ORDER — ROCURONIUM BROMIDE 10 MG/ML (PF) SYRINGE
PREFILLED_SYRINGE | INTRAVENOUS | Status: DC | PRN
  Administered 2021-01-05: 50 mg via INTRAVENOUS
  Administered 2021-01-05: 10 mg via INTRAVENOUS

## 2021-01-05 MED ORDER — PROPOFOL 10 MG/ML IV BOLUS
INTRAVENOUS | Status: AC
  Filled 2021-01-05: qty 20

## 2021-01-05 MED ORDER — LIDOCAINE HCL (PF) 2 % IJ SOLN
INTRAMUSCULAR | Status: AC
  Filled 2021-01-05: qty 5

## 2021-01-05 MED ORDER — MIDAZOLAM HCL 2 MG/2ML IJ SOLN
INTRAMUSCULAR | Status: DC | PRN
  Administered 2021-01-05: 2 mg via INTRAVENOUS

## 2021-01-05 MED ORDER — LIDOCAINE HCL 2 % IJ SOLN
INTRAMUSCULAR | Status: AC
  Filled 2021-01-05: qty 20

## 2021-01-05 MED ORDER — OXYCODONE HCL 5 MG PO TABS
5.0000 mg | ORAL_TABLET | Freq: Once | ORAL | Status: DC | PRN
Start: 2021-01-05 — End: 2021-01-05

## 2021-01-05 MED ORDER — CEFAZOLIN SODIUM-DEXTROSE 2-4 GM/100ML-% IV SOLN
2.0000 g | INTRAVENOUS | Status: AC
  Administered 2021-01-05: 2 g via INTRAVENOUS
  Filled 2021-01-05: qty 100

## 2021-01-05 MED ORDER — PROMETHAZINE HCL 25 MG/ML IJ SOLN
INTRAMUSCULAR | Status: AC
  Administered 2021-01-05: 6.25 mg via INTRAVENOUS
  Filled 2021-01-05: qty 1

## 2021-01-05 SURGICAL SUPPLY — 40 items
APPLICATOR COTTON TIP 6 STRL (MISCELLANEOUS) ×2 IMPLANT
APPLICATOR COTTON TIP 6IN STRL (MISCELLANEOUS) ×4
APPLIER CLIP 5 13 M/L LIGAMAX5 (MISCELLANEOUS)
APPLIER CLIP ROT 10 11.4 M/L (STAPLE) ×2
BENZOIN TINCTURE PRP APPL 2/3 (GAUZE/BANDAGES/DRESSINGS) IMPLANT
CABLE HIGH FREQUENCY MONO STRZ (ELECTRODE) IMPLANT
CATH REDDICK CHOLANGI 4FR 50CM (CATHETERS) ×2 IMPLANT
CLIP APPLIE 5 13 M/L LIGAMAX5 (MISCELLANEOUS) IMPLANT
CLIP APPLIE ROT 10 11.4 M/L (STAPLE) ×1 IMPLANT
COVER MAYO STAND STRL (DRAPES) ×2 IMPLANT
COVER SURGICAL LIGHT HANDLE (MISCELLANEOUS) ×2 IMPLANT
COVER WAND RF STERILE (DRAPES) IMPLANT
DECANTER SPIKE VIAL GLASS SM (MISCELLANEOUS) ×2 IMPLANT
DERMABOND ADVANCED (GAUZE/BANDAGES/DRESSINGS) ×1
DERMABOND ADVANCED .7 DNX12 (GAUZE/BANDAGES/DRESSINGS) ×1 IMPLANT
DRAPE C-ARM 42X120 X-RAY (DRAPES) ×2 IMPLANT
ELECT L-HOOK LAP 45CM DISP (ELECTROSURGICAL) ×2
ELECT PENCIL ROCKER SW 15FT (MISCELLANEOUS) ×2 IMPLANT
ELECT REM PT RETURN 15FT ADLT (MISCELLANEOUS) ×2 IMPLANT
ELECTRODE L-HOOK LAP 45CM DISP (ELECTROSURGICAL) ×1 IMPLANT
GLOVE BIOGEL M 8.0 STRL (GLOVE) ×2 IMPLANT
GOWN STRL REUS W/TWL XL LVL3 (GOWN DISPOSABLE) ×2 IMPLANT
HEMOSTAT SURGICEL 4X8 (HEMOSTASIS) IMPLANT
IV CATH 14GX2 1/4 (CATHETERS) ×2 IMPLANT
KIT BASIN OR (CUSTOM PROCEDURE TRAY) ×2 IMPLANT
KIT TURNOVER KIT A (KITS) ×2 IMPLANT
POUCH RETRIEVAL ECOSAC 10 (ENDOMECHANICALS) ×1 IMPLANT
POUCH RETRIEVAL ECOSAC 10MM (ENDOMECHANICALS) ×2
SCISSORS LAP 5X45 EPIX DISP (ENDOMECHANICALS) ×2 IMPLANT
SET IRRIG TUBING LAPAROSCOPIC (IRRIGATION / IRRIGATOR) ×2 IMPLANT
SET TUBE SMOKE EVAC HIGH FLOW (TUBING) ×2 IMPLANT
SLEEVE XCEL OPT CAN 5 100 (ENDOMECHANICALS) ×2 IMPLANT
STRIP CLOSURE SKIN 1/2X4 (GAUZE/BANDAGES/DRESSINGS) IMPLANT
SUT MNCRL AB 4-0 PS2 18 (SUTURE) ×4 IMPLANT
SYR 20ML LL LF (SYRINGE) ×2 IMPLANT
TOWEL OR 17X26 10 PK STRL BLUE (TOWEL DISPOSABLE) ×2 IMPLANT
TRAY LAPAROSCOPIC (CUSTOM PROCEDURE TRAY) ×2 IMPLANT
TROCAR BLADELESS OPT 5 100 (ENDOMECHANICALS) ×2 IMPLANT
TROCAR XCEL BLUNT TIP 100MML (ENDOMECHANICALS) IMPLANT
TROCAR XCEL NON-BLD 11X100MML (ENDOMECHANICALS) IMPLANT

## 2021-01-05 NOTE — Anesthesia Procedure Notes (Addendum)
Procedure Name: Intubation Date/Time: 01/05/2021 11:58 AM Performed by: Michele Rockers, CRNA Pre-anesthesia Checklist: Patient identified, Patient being monitored, Timeout performed, Emergency Drugs available and Suction available Patient Re-evaluated:Patient Re-evaluated prior to induction Oxygen Delivery Method: Circle System Utilized Preoxygenation: Pre-oxygenation with 100% oxygen Induction Type: IV induction Ventilation: Mask ventilation without difficulty Laryngoscope Size: Miller and 2 Grade View: Grade III Tube type: Oral Tube size: 7.5 mm Number of attempts: 2 Airway Equipment and Method: Stylet Placement Confirmation: ETT inserted through vocal cords under direct vision,  positive ETCO2 and breath sounds checked- equal and bilateral Secured at: 22 cm Tube secured with: Tape Dental Injury: Teeth and Oropharynx as per pre-operative assessment  Difficulty Due To: Difficult Airway- due to anterior larynx Comments: Inadequate view obtained by CRNA x 1 attempt. Difficulty by myself Deatra Canter, MD) due to limited ROM and stiff epiglottis but eventually grade II view. ATOI. +BBS, +ETCO2. Tube secured.

## 2021-01-05 NOTE — Transfer of Care (Signed)
Immediate Anesthesia Transfer of Care Note  Patient: William Bjornstad, MD  Procedure(s) Performed: LAPAROSCOPIC CHOLECYSTECTOMY WITH INTRAOPERATIVE CHOLANGIOGRAM (N/A Abdomen)  Patient Location: PACU  Anesthesia Type:General  Level of Consciousness: drowsy, patient cooperative and responds to stimulation  Airway & Oxygen Therapy: Patient Spontanous Breathing and Patient connected to face mask oxygen  Post-op Assessment: Report given to RN and Post -op Vital signs reviewed and stable  Post vital signs: Reviewed and stable  Last Vitals:  Vitals Value Taken Time  BP    Temp    Pulse    Resp    SpO2      Last Pain:  Vitals:   01/05/21 1016  TempSrc:   PainSc: 0-No pain         Complications: No complications documented.

## 2021-01-05 NOTE — Anesthesia Preprocedure Evaluation (Addendum)
Anesthesia Evaluation  Patient identified by MRN, date of birth, ID band Patient awake    Reviewed: Allergy & Precautions, NPO status , Patient's Chart, lab work & pertinent test results  History of Anesthesia Complications Negative for: history of anesthetic complications  Airway Mallampati: II  TM Distance: >3 FB Neck ROM: Limited    Dental  (+) Dental Advisory Given   Pulmonary neg pulmonary ROS,    breath sounds clear to auscultation       Cardiovascular negative cardio ROS   Rhythm:Regular Rate:Normal     Neuro/Psych S/p cervical fusion and lumbar laminectomy  Neuromuscular disease negative psych ROS   GI/Hepatic Neg liver ROS, GERD  Controlled,  Endo/Other  Hypothyroidism   Renal/GU negative Renal ROS  negative genitourinary   Musculoskeletal  (+) Arthritis ,   Abdominal   Peds  Hematology negative hematology ROS (+)   Anesthesia Other Findings Day of surgery medications reviewed with patient.  Reproductive/Obstetrics negative OB ROS                            Anesthesia Physical Anesthesia Plan  ASA: II  Anesthesia Plan: General   Post-op Pain Management:    Induction: Intravenous  PONV Risk Score and Plan: 3 and Treatment may vary due to age or medical condition, Ondansetron and Dexamethasone  Airway Management Planned: Oral ETT and Video Laryngoscope Planned  Additional Equipment: None  Intra-op Plan:   Post-operative Plan: Extubation in OR  Informed Consent: I have reviewed the patients History and Physical, chart, labs and discussed the procedure including the risks, benefits and alternatives for the proposed anesthesia with the patient or authorized representative who has indicated his/her understanding and acceptance.     Dental advisory given  Plan Discussed with: CRNA  Anesthesia Plan Comments:        Anesthesia Quick Evaluation

## 2021-01-05 NOTE — Discharge Instructions (Signed)
Minimally Invasive Cholecystectomy Minimally invasive cholecystectomy is surgery to remove the gallbladder. The gallbladder is a pear-shaped organ that lies beneath the liver on the right side of the body. The gallbladder stores bile, which is a fluid that helps the body digest fats. Cholecystectomy is often done to treat inflammation of the gallbladder (cholecystitis). This condition is usually caused by a buildup of gallstones (cholelithiasis) in the gallbladder. Gallstones can block the flow of bile, which can result in inflammation and pain. In severe cases, emergency surgery may be required. This procedure is done though small incisions in the abdomen, instead of one large incision. It is also called laparoscopic surgery. A thin scope with a camera (laparoscope) is inserted through one incision. Then surgical instruments are inserted through the other incisions. In some cases, a minimally invasive surgery may need to be changed to a surgery that is done through a larger incision. This is called open surgery. Tell a health care provider about:  Any allergies you have.  All medicines you are taking, including vitamins, herbs, eye drops, creams, and over-the-counter medicines.  Any problems you or family members have had with anesthetic medicines.  Any blood disorders you have.  Any surgeries you have had.  Any medical conditions you have.  Whether you are pregnant or may be pregnant. What are the risks? Generally, this is a safe procedure. However, problems may occur, including:  Infection.  Bleeding.  Allergic reactions to medicines.  Damage to nearby structures or organs.  A stone remaining in the common bile duct. The common bile duct carries bile from the gallbladder into the small intestine.  A bile leak from the cyst duct that is clipped when your gallbladder is removed. What happens before the procedure? Staying hydrated Follow instructions from your health care provider  about hydration, which may include:  Up to 2 hours before the procedure - you may continue to drink clear liquids, such as water, clear fruit juice, black coffee, and plain tea.   Eating and drinking restrictions Follow instructions from your health care provider about eating and drinking, which may include:  8 hours before the procedure - stop eating heavy meals or foods, such as meat, fried foods, or fatty foods.  6 hours before the procedure - stop eating light meals or foods, such as toast or cereal.  6 hours before the procedure - stop drinking milk or drinks that contain milk.  2 hours before the procedure - stop drinking clear liquids. Medicines Ask your health care provider about:  Changing or stopping your regular medicines. This is especially important if you are taking diabetes medicines or blood thinners.  Taking medicines such as aspirin and ibuprofen. These medicines can thin your blood. Do not take these medicines unless your health care provider tells you to take them.  Taking over-the-counter medicines, vitamins, herbs, and supplements. General instructions  Let your health care provider know if you develop a cold or an infection before surgery.  Plan to have someone take you home from the hospital or clinic.  If you will be going home right after the procedure, plan to have someone with you for 24 hours.  Ask your health care provider: ? How your surgery site will be marked. ? What steps will be taken to help prevent infection. These may include:  Removing hair at the surgery site.  Washing skin with a germ-killing soap.  Taking antibiotic medicine. What happens during the procedure?  An IV will be inserted into one  of your veins.  You will be given one or both of the following: ? A medicine to help you relax (sedative). ? A medicine to make you fall asleep (general anesthetic).  A breathing tube will be placed in your mouth.  Your surgeon will make  several small incisions in your abdomen.  The laparoscope will be inserted through one of the small incisions. The camera on the laparoscope will send images to a monitor in the operating room. This lets your surgeon see inside your abdomen.  A gas will be pumped into your abdomen. This will expand your abdomen to give the surgeon more room to perform the surgery.  Other tools that are needed for the procedure will be inserted through the other incisions. The gallbladder will be removed through one of the incisions.  Your common bile duct may be examined. If stones are found in the common bile duct, they may be removed.  After your gallbladder has been removed, the incisions will be closed with stitches (sutures), staples, or skin glue.  Your incisions may be covered with a bandage (dressing). The procedure may vary among health care providers and hospitals.   What happens after the procedure?  Your blood pressure, heart rate, breathing rate, and blood oxygen level will be monitored until you leave the hospital or clinic.  You will be given medicines as needed to control your pain.  If you were given a sedative during the procedure, it can affect you for several hours. Do not drive or operate machinery until your health care provider says that it is safe. Summary  Minimally invasive cholecystectomy, also called laparoscopic cholecystectomy, is surgery to remove the gallbladder using small incisions.  Tell your health care provider about all the medical conditions you have and all the medicines you are taking for those conditions.  Before the procedure, follow instructions about eating or drinking restrictions and changing or stopping medicines.  If you were given a sedative during the procedure, it can affect you for several hours. Do not drive or operate machinery until your health care provider says that it is safe. This information is not intended to replace advice given to you by  your health care provider. Make sure you discuss any questions you have with your health care provider. Document Revised: 08/09/2019 Document Reviewed: 08/09/2019 Elsevier Patient Education  [employer].

## 2021-01-05 NOTE — Interval H&P Note (Signed)
History and Physical Interval Note:  01/05/2021 11:13 AM  Carlena Bjornstad, MD  has presented today for surgery, with the diagnosis of suspiciously thick gallbladder.  The various methods of treatment have been discussed with the patient and family. After consideration of risks, benefits and other options for treatment, the patient has consented to  Procedure(s): LAPAROSCOPIC CHOLECYSTECTOMY WITH INTRAOPERATIVE CHOLANGIOGRAM (N/A) as a surgical intervention.  The patient's history has been reviewed, patient examined, no change in status, stable for surgery.  I have reviewed the patient's chart and labs.  Questions were answered to the patient's satisfaction.     Pedro Earls

## 2021-01-05 NOTE — Op Note (Signed)
Carlena Bjornstad, MD  Primary Care Physician:  Janith Lima, MD    01/05/2021  1:18 PM  Procedure: Laparoscopic Cholecystectomy with intraoperative cholangiogram  Surgeon: Catalina Antigua B. Hassell Done, MD, [name] Asst:  Louanna Raw, MD  Anes:  General  Drains:  None  Findings: Chronic cholecystitis with entire gallbladder enshrouded in fat and omentum;  Normal IOC  Description of Procedure: The patient was taken to OR 1 and given general anesthesia.  The patient was prepped with chlorohexidine prep and draped sterilely. A time out was performed including identifying the patient and discussing their procedure.  Access to the abdomen was achieved with 5 mm Optiview.  Port placement included three 5 mm trocars and one 11 mm trocar in the upper midline.    The gallbladder was visualized and appeared chronically inflemed.   The fundus of the gallbaldder was grasped and the gallbladder was elevated. Traction on the infundibulum allowed for successful demonstration of the critical view. Inflammatory changes were chronic/subacute.  The cystic duct was identified and clipped up on the gallbladder and an incision was made in the cystic duct and the Reddick catheter was inserted after milking the cystic duct of any debris although none was found. A dynamic cholangiogram was performed which demonstrated intrahepatic filling and free flow into the duodenum.  The thickening seen on the ultrasound was probably the fatty adhesions all on the gallbladder.    The cystic duct was then triple clipped and divided, the cystic artery was double clipped and divided and then the gallbladder was removed from the gallbladder bed. Removal of the gallbladder from the gallbladder bed was performed without difficulty.  The gallbladder was then placed in a bag and brought out through the 11 mm port in the upper midline. The gallbladder bed was inspected and no bleeding or bile leaks were seen.   The 11 mm trocar was not enlarged, was  oblique and through the falciform.   Incisions were injected with Exparel as a TAP block at the beginning and the skin was  closed with 4-0 Monocryl and Dermabond on the skin.  Sponge and needle count were  correct.    The patient was taken to the recovery room in satisfactory condition.  [name], MD, [name]

## 2021-01-08 ENCOUNTER — Encounter (HOSPITAL_COMMUNITY): Payer: Self-pay | Admitting: Surgery

## 2021-01-08 LAB — SURGICAL PATHOLOGY

## 2021-01-09 NOTE — Anesthesia Postprocedure Evaluation (Signed)
Anesthesia Post Note  Patient: William Bjornstad, MD  Procedure(s) Performed: LAPAROSCOPIC CHOLECYSTECTOMY WITH INTRAOPERATIVE CHOLANGIOGRAM (N/A Abdomen)     Patient location during evaluation: PACU Anesthesia Type: General Level of consciousness: awake and alert Pain management: pain level controlled Vital Signs Assessment: post-procedure vital signs reviewed and stable Respiratory status: spontaneous breathing, nonlabored ventilation, respiratory function stable and patient connected to nasal cannula oxygen Cardiovascular status: blood pressure returned to baseline and stable Postop Assessment: no apparent nausea or vomiting Anesthetic complications: no   No complications documented.  Last Vitals:  Vitals:   01/05/21 1600 01/05/21 1615  BP: 123/78 124/71  Pulse: 77 76  Resp: 14 19  Temp:    SpO2: 92% 93%    Last Pain:  Vitals:   01/05/21 1615  TempSrc:   PainSc: 3                  Tiajuana Amass

## 2021-01-22 DIAGNOSIS — S83242D Other tear of medial meniscus, current injury, left knee, subsequent encounter: Secondary | ICD-10-CM | POA: Diagnosis not present

## 2021-01-22 DIAGNOSIS — M2392 Unspecified internal derangement of left knee: Secondary | ICD-10-CM | POA: Diagnosis not present

## 2021-01-22 DIAGNOSIS — M25562 Pain in left knee: Secondary | ICD-10-CM | POA: Diagnosis not present

## 2021-01-22 DIAGNOSIS — S83412D Sprain of medial collateral ligament of left knee, subsequent encounter: Secondary | ICD-10-CM | POA: Diagnosis not present

## 2021-01-30 ENCOUNTER — Encounter: Payer: Self-pay | Admitting: Internal Medicine

## 2021-01-30 ENCOUNTER — Ambulatory Visit (INDEPENDENT_AMBULATORY_CARE_PROVIDER_SITE_OTHER): Payer: Medicare Other | Admitting: Sports Medicine

## 2021-01-30 ENCOUNTER — Other Ambulatory Visit: Payer: Self-pay | Admitting: Internal Medicine

## 2021-01-30 ENCOUNTER — Other Ambulatory Visit: Payer: Self-pay

## 2021-01-30 ENCOUNTER — Ambulatory Visit: Payer: Medicare Other | Admitting: Sports Medicine

## 2021-01-30 DIAGNOSIS — M25562 Pain in left knee: Secondary | ICD-10-CM

## 2021-01-30 DIAGNOSIS — K743 Primary biliary cirrhosis: Secondary | ICD-10-CM

## 2021-01-30 NOTE — Assessment & Plan Note (Signed)
Given patient history, presentation, and exam he most likely has some medial meniscus involvement. Ligaments intact and although ultrasound did not show pathology it is most likely cause of his pain - Cont with Alleve as needed for pain - Cont with knee compression sleeve when active - Start isometric quad strengthening exercises - Avoid aggravating activites, f/u in 4 weeks

## 2021-01-30 NOTE — Progress Notes (Signed)
    SUBJECTIVE:   CHIEF COMPLAINT / HPI:   Left Knee Pain Dr. Guggenheim is a very pleasant 70 y/o male who presents today for follow after developing knee pain while skiing last week. He had no fall or trauma but first developed it while skiing downhill and then exacerbated his pain when putting on skis the next day. He did have swelling and pain localized to the medial aspect of the knee with no radiation. He can bear weight. He was seen by an orthopedic doctor who got x-rays and he was fitted with a hinged knee brace. Since then it has gotten better and the swelling has resolved but he still has some pain with particular movements. He can bear weight on it. He denies any catching, locking, or giving way.  PERTINENT  PMH / PSH: PBC, GERD, Hypothyroidism, HLD  OBJECTIVE:   BP 132/84   Ht 5\' 8"  (1.727 m)   Wt 160 lb (72.6 kg)   BMI 24.33 kg/m   No flowsheet data found.  Knee, Left: Inspection was negative for erythema, ecchymosis, and effusion. No obvious bony abnormalities or signs of osteophyte development. Palpation yielded no asymmetric warmth; Mild medial joint line tenderness; No condyle tenderness; No patellar tenderness; No patellar crepitus. Patellar and quadriceps tendons unremarkable, and no tenderness of the pes anserine bursa.ROM normal in flexion (135 degrees) and extension (0 degrees). Normal hamstring and quadriceps strength. Neurovascularly intact bilaterally. Special Tests  - Cruciate Ligaments:   - Anterior Drawer:  NEG - Posterior Drawer: NEG   - Lachman:  NEG  - Collateral Ligaments:   - Varus/Valgus Stress test: NEG  - Meniscus:   - McMurray's: Positive  - Patella:   - Patellar grind/compression: NEG   - Patellar glide: Without apprehension  Limited MSK U/S: Left Knee Medial meniscus well visualized without any evidence of tearing or mushroom sign. Mild spurring at the medial tibia. No suprapatellar effusion. Impression: Negative for meniscus  pathology  ASSESSMENT/PLAN:   Left knee pain Given patient history, presentation, and exam he most likely has some medial meniscus involvement. Ligaments intact and although ultrasound did not show pathology it is most likely cause of his pain - Cont with Alleve as needed for pain - Cont with knee compression sleeve when active - Start isometric quad strengthening exercises - Avoid aggravating activites, f/u in 4 weeks     Nuala Alpha, DO PGY-4, Sports Medicine Fellow [facility]  Dr. Ron Parker was seen and evaluated with the sports medicine fellow.  I agree with the above plan of care.  Knee pain is likely secondary to a medial meniscal injury but his lack of mechanical symptoms is reassuring.  We will proceed with treatment as above and he will follow up with me again in 4 weeks.  If his symptoms do not continue to improve then we may need to consider merits of further diagnostic imaging.

## 2021-01-31 ENCOUNTER — Other Ambulatory Visit (INDEPENDENT_AMBULATORY_CARE_PROVIDER_SITE_OTHER): Payer: Medicare Other

## 2021-01-31 DIAGNOSIS — K743 Primary biliary cirrhosis: Secondary | ICD-10-CM | POA: Diagnosis not present

## 2021-01-31 LAB — HEPATIC FUNCTION PANEL
ALT: 55 U/L — ABNORMAL HIGH (ref 0–53)
AST: 57 U/L — ABNORMAL HIGH (ref 0–37)
Albumin: 4.1 g/dL (ref 3.5–5.2)
Alkaline Phosphatase: 98 U/L (ref 39–117)
Bilirubin, Direct: 0 mg/dL (ref 0.0–0.3)
Total Bilirubin: 0.5 mg/dL (ref 0.2–1.2)
Total Protein: 7.6 g/dL (ref 6.0–8.3)

## 2021-01-31 LAB — CBC
HCT: 42.8 % (ref 39.0–52.0)
Hemoglobin: 14.7 g/dL (ref 13.0–17.0)
MCHC: 34.4 g/dL (ref 30.0–36.0)
MCV: 85.8 fl (ref 78.0–100.0)
Platelets: 226 10*3/uL (ref 150.0–400.0)
RBC: 4.99 Mil/uL (ref 4.22–5.81)
RDW: 13.8 % (ref 11.5–15.5)
WBC: 6 10*3/uL (ref 4.0–10.5)

## 2021-01-31 LAB — PROTIME-INR
INR: 1.1 ratio — ABNORMAL HIGH (ref 0.8–1.0)
Prothrombin Time: 12.2 s (ref 9.6–13.1)

## 2021-01-31 LAB — GAMMA GT: GGT: 66 U/L — ABNORMAL HIGH (ref 7–51)

## 2021-02-06 ENCOUNTER — Other Ambulatory Visit: Payer: Self-pay | Admitting: Internal Medicine

## 2021-02-06 DIAGNOSIS — E785 Hyperlipidemia, unspecified: Secondary | ICD-10-CM

## 2021-02-26 ENCOUNTER — Other Ambulatory Visit: Payer: Self-pay | Admitting: Internal Medicine

## 2021-02-26 ENCOUNTER — Encounter: Payer: Self-pay | Admitting: Internal Medicine

## 2021-02-26 DIAGNOSIS — K743 Primary biliary cirrhosis: Secondary | ICD-10-CM

## 2021-02-26 DIAGNOSIS — R4681 Obsessive-compulsive behavior: Secondary | ICD-10-CM

## 2021-02-26 DIAGNOSIS — E785 Hyperlipidemia, unspecified: Secondary | ICD-10-CM

## 2021-02-26 MED ORDER — ATORVASTATIN CALCIUM 20 MG PO TABS: 20.0000 mg | ORAL_TABLET | Freq: Every day | ORAL | 1 refills | Status: DC

## 2021-02-26 MED ORDER — URSODIOL 250 MG PO TABS: 500.0000 mg | ORAL_TABLET | Freq: Two times a day (BID) | ORAL | 1 refills | Status: DC

## 2021-02-26 MED ORDER — SERTRALINE HCL 50 MG PO TABS: 100.0000 mg | ORAL_TABLET | Freq: Every day | ORAL | 1 refills | Status: DC

## 2021-02-27 ENCOUNTER — Other Ambulatory Visit: Payer: Self-pay | Admitting: Internal Medicine

## 2021-02-27 DIAGNOSIS — E034 Atrophy of thyroid (acquired): Secondary | ICD-10-CM

## 2021-02-28 LAB — TSH: TSH: 3.63 (ref <=5.90)

## 2021-02-28 LAB — LIPID PANEL
Cholesterol: 175 (ref 0–200)
HDL: 45 (ref 35–70)
LDL Cholesterol: 105
Triglycerides: 143 (ref 40–160)

## 2021-02-28 LAB — HEMOGLOBIN A1C: Hemoglobin A1C: 6.3

## 2021-03-06 ENCOUNTER — Other Ambulatory Visit: Payer: Self-pay

## 2021-03-06 ENCOUNTER — Ambulatory Visit (INDEPENDENT_AMBULATORY_CARE_PROVIDER_SITE_OTHER): Payer: Medicare Other | Admitting: Sports Medicine

## 2021-03-06 VITALS — BP 112/78 | Ht 68.0 in | Wt 160.0 lb

## 2021-03-06 DIAGNOSIS — M25562 Pain in left knee: Secondary | ICD-10-CM | POA: Diagnosis not present

## 2021-03-06 MED ORDER — METHYLPREDNISOLONE ACETATE 40 MG/ML IJ SUSP
40.0000 mg | Freq: Once | INTRAMUSCULAR | Status: AC
  Administered 2021-03-06: 40 mg via INTRA_ARTICULAR

## 2021-03-06 NOTE — Progress Notes (Signed)
    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 Unfortunately he is still having significant pain. He has had two instances of sharp, stabbing, intense pain that occurred while doing normal activities. He has difficulty trusting his knee to put significant weight on it and this has limited his ability to do some of the exercises. It does respond to rest, ice, and activity modification but overall he has felt little improvement. He is having less difficulty going up and down stairs which was an issue last time. It is starting to hurt him at night time and has woken him from sleep.  PERTINENT  PMH / PSH: PBC, GERD, HLD, Hypothyroidism  OBJECTIVE:   BP 112/78   Ht 5\' 8"  (1.727 m)   Wt 160 lb (72.6 kg)   BMI 24.33 kg/m   No flowsheet data found.  Knee, Left: Inspection was negative for erythema, ecchymosis, and positive for very mild effusion. No obvious bony abnormalities or signs of osteophyte development. Palpation yielded no asymmetric warmth; No joint line tenderness; No patellar crepitus. ROM normal in flexion (135 degrees) and extension (0 degrees). Normal hamstring and quadriceps strength. Neurovascularly intact bilaterally. Special Tests  - Cruciate Ligaments:   - Anterior Drawer:  NEG - Posterior Drawer: NEG  - Collateral Ligaments:   - Varus/Valgus Stress test: NEG  - Meniscus:   - McMurray's: NEG  - Patella:   - Patellar grind/compression: NEG  ASSESSMENT/PLAN:   Left knee pain Left knee pain with minimal improvement in symptoms with conservative management.  - Injection as noted below. - MRI of left knee - F/u after MRI results to discuss next best steps  Written and verbal consent was obtained after discussing the risks and benefits of the procedure with the patient. A timeout was performed and the correct site and side was identified and confirmed.  The left knee was cleaned in sterile  fashion betadine and alcohol pad. 1cc of 40 mg Depo-medrol and 3 cc 1% Lidocaine was injected using a anteromedial approach using a 5 cc syringe and 25 gauge 1 and 1/2 in needle. No complications were encountered. Minimal blood loss. A band aid was applied.      Nuala Alpha, DO PGY-4, Sports Medicine Fellow [facility]  Dr. [name] was seen and evaluated with the sports medicine fellow.  I agree with the above plan of care.  Cortisone injection administered to the left knee as above.  X-rays were fairly unremarkable so we will proceed with MRI specifically to rule out a significant meniscal tear which may be causing his symptoms.  Phone follow-up with those results when available at which point we will delineate further treatment.

## 2021-03-06 NOTE — Assessment & Plan Note (Signed)
Left knee pain with minimal improvement in symptoms with conservative management.  - Injection as noted below. - MRI of left knee - F/u after MRI results to discuss next best steps

## 2021-03-06 NOTE — Patient Instructions (Signed)
It was great to see you today! Thank you for letting me participate in your care!  Today, we discussed your continued left knee pain and we are ordering an MRI for further evaluation. Once it has been performed we will see you in our office to discuss the best next steps.  Be well, Harolyn Rutherford, DO PGY-4, Sports Medicine Fellow [place]

## 2021-03-10 ENCOUNTER — Ambulatory Visit
Admission: RE | Admit: 2021-03-10 | Discharge: 2021-03-10 | Disposition: A | Discharge status: Home / Self Care | Payer: Medicare Other | Source: Ambulatory Visit | Attending: Sports Medicine | Admitting: Sports Medicine

## 2021-03-10 DIAGNOSIS — M25562 Pain in left knee: Secondary | ICD-10-CM | POA: Diagnosis not present

## 2021-03-12 ENCOUNTER — Telehealth: Payer: Self-pay | Admitting: Sports Medicine

## 2021-03-12 NOTE — Telephone Encounter (Signed)
  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  This is in the setting of mild partial-thickness cartilage loss in the medial and lateral femorotibial compartments.  Dr. Ron Parker states that his knee is feeling better after his recent cortisone injection.  At this point in time, we are going to take a watchful waiting approach.  He will continue to increase activity as tolerated but I recommended against repetitive impact exercise.  He enjoys cycling and biking and I think those are okay.  He does understand that we can refer him for surgical consultation if his symptoms warrant, but I also explained that the outcomes of arthroscopic debridement in someone his age are quite variable.  He will follow-up with me as needed.

## 2021-03-14 ENCOUNTER — Other Ambulatory Visit (HOSPITAL_COMMUNITY): Payer: Self-pay | Admitting: *Deleted

## 2021-03-15 DIAGNOSIS — N401 Enlarged prostate with lower urinary tract symptoms: Secondary | ICD-10-CM | POA: Diagnosis not present

## 2021-03-15 DIAGNOSIS — N411 Chronic prostatitis: Secondary | ICD-10-CM | POA: Diagnosis not present

## 2021-03-15 DIAGNOSIS — N281 Cyst of kidney, acquired: Secondary | ICD-10-CM | POA: Diagnosis not present

## 2021-03-15 DIAGNOSIS — N138 Other obstructive and reflux uropathy: Secondary | ICD-10-CM | POA: Diagnosis not present

## 2021-03-15 DIAGNOSIS — N529 Male erectile dysfunction, unspecified: Secondary | ICD-10-CM | POA: Diagnosis not present

## 2021-03-19 ENCOUNTER — Other Ambulatory Visit: Payer: Self-pay | Admitting: Internal Medicine

## 2021-03-19 ENCOUNTER — Other Ambulatory Visit: Payer: Medicare Other

## 2021-03-19 DIAGNOSIS — E034 Atrophy of thyroid (acquired): Secondary | ICD-10-CM

## 2021-03-19 MED ORDER — LEVOTHYROXINE SODIUM 112 MCG PO TABS: 112.0000 ug | ORAL_TABLET | Freq: Every day | ORAL | 0 refills | Status: DC

## 2021-03-20 ENCOUNTER — Other Ambulatory Visit: Payer: Self-pay

## 2021-03-20 ENCOUNTER — Ambulatory Visit (HOSPITAL_COMMUNITY)
Admission: RE | Admit: 2021-03-20 | Discharge: 2021-03-20 | Disposition: A | Discharge status: Home / Self Care | Payer: Self-pay | Source: Ambulatory Visit | Attending: Cardiology | Admitting: Cardiology

## 2021-03-21 ENCOUNTER — Other Ambulatory Visit: Payer: Self-pay | Admitting: Internal Medicine

## 2021-03-21 DIAGNOSIS — E034 Atrophy of thyroid (acquired): Secondary | ICD-10-CM

## 2021-04-09 ENCOUNTER — Encounter: Payer: Self-pay | Admitting: Internal Medicine

## 2021-04-10 ENCOUNTER — Telehealth: Payer: Self-pay | Admitting: Internal Medicine

## 2021-04-10 DIAGNOSIS — K743 Primary biliary cirrhosis: Secondary | ICD-10-CM

## 2021-04-10 DIAGNOSIS — E785 Hyperlipidemia, unspecified: Secondary | ICD-10-CM

## 2021-04-10 DIAGNOSIS — R7303 Prediabetes: Secondary | ICD-10-CM

## 2021-04-10 NOTE — Telephone Encounter (Signed)
Labs ordered to evaluate increased LFT

## 2021-04-11 ENCOUNTER — Other Ambulatory Visit (INDEPENDENT_AMBULATORY_CARE_PROVIDER_SITE_OTHER): Payer: Medicare Other

## 2021-04-11 DIAGNOSIS — R7303 Prediabetes: Secondary | ICD-10-CM | POA: Diagnosis not present

## 2021-04-11 DIAGNOSIS — K743 Primary biliary cirrhosis: Secondary | ICD-10-CM | POA: Diagnosis not present

## 2021-04-11 DIAGNOSIS — E785 Hyperlipidemia, unspecified: Secondary | ICD-10-CM

## 2021-04-11 LAB — HEPATIC FUNCTION PANEL
ALT: 41 U/L (ref 0–53)
AST: 52 U/L — ABNORMAL HIGH (ref 0–37)
Albumin: 4.3 g/dL (ref 3.5–5.2)
Alkaline Phosphatase: 84 U/L (ref 39–117)
Bilirubin, Direct: 0.2 mg/dL (ref 0.0–0.3)
Total Bilirubin: 0.7 mg/dL (ref 0.2–1.2)
Total Protein: 7.6 g/dL (ref 6.0–8.3)

## 2021-04-12 LAB — INSULIN, RANDOM: Insulin: 4.8 u[IU]/mL

## 2021-04-12 LAB — APOLIPOPROTEIN B: Apolipoprotein B: 86 mg/dL (ref <90)

## 2021-04-30 DIAGNOSIS — Z23 Encounter for immunization: Secondary | ICD-10-CM | POA: Diagnosis not present

## 2021-05-17 ENCOUNTER — Other Ambulatory Visit: Payer: Self-pay

## 2021-05-17 ENCOUNTER — Ambulatory Visit (INDEPENDENT_AMBULATORY_CARE_PROVIDER_SITE_OTHER): Payer: Medicare Other | Admitting: Sports Medicine

## 2021-05-17 DIAGNOSIS — M25562 Pain in left knee: Secondary | ICD-10-CM

## 2021-05-17 NOTE — Assessment & Plan Note (Signed)
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 Rhona Raider  We reinforced home exercises to keep his quad strength good He continues activity that does not bother his knee  He is a good candidate for surgery if that is required

## 2021-05-17 NOTE — Patient Instructions (Signed)
[facility] [address] Laurey Arrow [address] [place] 7.27.22 at [place] Arrival time is [id number] [phone]

## 2021-05-17 NOTE — Progress Notes (Signed)
Chief complaint left knee pain  Patient had a skiing injury that we have been treating conservatively This appears to be a meniscus injury He has not had locking giving way However he remains active and occasionally he is getting flares of pain along the medial aspect of his left knee On last visit he had irritated this and had a corticosteroid injection That helped with pain and swelling for couple weeks Because of continued medial sided pain he would like to consider whether any surgical care might be helpful  Physical exam Pleasant older male in no acute distress Ht 5\' 8"  (1.727 m)   Wt 158 lb (71.7 kg)   BMI 24.02 kg/m  No flowsheet data found.  Left knee shows some slight puffiness in the suprapatellar pouch He can do flexion and extension without extensive pain Knee ligaments are stable to examination Some palpable pain along the medial joint line  MRI results reviewed Complex tear down the medial meniscus Relatively mild arthritic changes

## 2021-05-23 ENCOUNTER — Other Ambulatory Visit: Payer: Self-pay | Admitting: Internal Medicine

## 2021-05-23 DIAGNOSIS — E034 Atrophy of thyroid (acquired): Secondary | ICD-10-CM

## 2021-05-24 ENCOUNTER — Encounter: Payer: Self-pay | Admitting: Internal Medicine

## 2021-06-04 ENCOUNTER — Other Ambulatory Visit: Payer: Self-pay

## 2021-06-04 ENCOUNTER — Ambulatory Visit (INDEPENDENT_AMBULATORY_CARE_PROVIDER_SITE_OTHER): Payer: Medicare Other | Admitting: Internal Medicine

## 2021-06-04 ENCOUNTER — Encounter: Payer: Self-pay | Admitting: Internal Medicine

## 2021-06-04 VITALS — BP 122/72 | HR 69 | Temp 98.1°F | Resp 16 | Ht 68.0 in | Wt 159.0 lb

## 2021-06-04 DIAGNOSIS — E034 Atrophy of thyroid (acquired): Secondary | ICD-10-CM | POA: Diagnosis not present

## 2021-06-04 DIAGNOSIS — R7303 Prediabetes: Secondary | ICD-10-CM | POA: Diagnosis not present

## 2021-06-04 LAB — TSH: TSH: 2.58 u[IU]/mL (ref 0.35–5.50)

## 2021-06-04 LAB — HEMOGLOBIN A1C: Hgb A1c MFr Bld: 5.9 % (ref 4.6–6.5)

## 2021-06-04 MED ORDER — LEVOTHYROXINE SODIUM 112 MCG PO TABS: 112.0000 ug | ORAL_TABLET | Freq: Every day | ORAL | 0 refills | Status: DC

## 2021-06-04 NOTE — Patient Instructions (Signed)

## 2021-06-04 NOTE — Progress Notes (Signed)
Subjective:  Patient ID: William Bjornstad, William Luna, male    DOB: [birth year]  Age: 70 y.o. MRN: [id number]  CC: Hypothyroidism  This visit occurred during the SARS-CoV-2 public health emergency.  Safety protocols were in place, including screening questions prior to the visit, additional usage of staff PPE, and extensive cleaning of exam room while observing appropriate contact time as indicated for disinfecting solutions.    HPI William Bjornstad, William Luna presents for f/up - About 3 months ago his A1c was elevated at 6.3%.  Since then he has lost weight with lifestyle modifications.  He is active and denies any recent episodes of chest pain, shortness of breath, palpitations, diaphoresis, dizziness, or lightheadedness.  Outpatient Medications Prior to Visit  Medication Sig Dispense Refill   atorvastatin (LIPITOR) 20 MG tablet Take 1 tablet (20 mg total) by mouth daily. 90 tablet 1   cetirizine (ZYRTEC) 10 MG tablet Take 10 mg by mouth daily.     sertraline (ZOLOFT) 50 MG tablet Take 2 tablets (100 mg total) by mouth daily. 180 tablet 1   ursodiol (ACTIGALL) 250 MG tablet Take 2 tablets (500 mg total) by mouth 2 (two) times daily. 360 tablet 1   zaleplon (SONATA) 10 MG capsule Take 1 capsule (10 mg total) by mouth at bedtime as needed for sleep. 50 capsule 1   levothyroxine (SYNTHROID) 112 MCG tablet Take 1 tablet by mouth once daily 90 tablet 0   dicyclomine (BENTYL) 20 MG tablet Take 1 tablet (20 mg total) by mouth every 6 (six) hours as needed for spasms (Abdominal pain). 60 tablet 0   No facility-administered medications prior to visit.    ROS Review of Systems  Constitutional:  Negative for diaphoresis and fatigue.  HENT: Negative.    Eyes: Negative.   Respiratory:  Negative for chest tightness, shortness of breath and wheezing.   Cardiovascular:  Negative for chest pain, palpitations and leg swelling.  Gastrointestinal:  Negative for abdominal pain, diarrhea, nausea and vomiting.  Endocrine:  Negative.   Genitourinary: Negative.  Negative for difficulty urinating and dysuria.  Musculoskeletal: Negative.   Skin: Negative.   Neurological: Negative.  Negative for dizziness, weakness and light-headedness.  Hematological:  Negative for adenopathy. Does not bruise/bleed easily.   Objective:  BP 122/72 (BP Location: Left Arm, Patient Position: Sitting, Cuff Size: Large)   Pulse 69   Temp 98.1 F (36.7 C) (Oral)   Resp 16   Ht 5\' 8"  (1.727 m)   Wt 159 lb (72.1 kg)   SpO2 97%   BMI 24.18 kg/m   BP Readings from Last 3 Encounters:  06/04/21 122/72  03/06/21 112/78  01/30/21 132/84    Wt Readings from Last 3 Encounters:  06/04/21 159 lb (72.1 kg)  05/17/21 158 lb (71.7 kg)  03/06/21 160 lb (72.6 kg)    Physical Exam Vitals reviewed.  HENT:     Nose: Nose normal.     Mouth/Throat:     Mouth: Mucous membranes are moist.  Eyes:     Conjunctiva/sclera: Conjunctivae normal.  Cardiovascular:     Rate and Rhythm: Normal rate and regular rhythm.     Heart sounds: No murmur heard. Pulmonary:     Effort: Pulmonary effort is normal.     Breath sounds: Normal breath sounds.  Abdominal:     General: Abdomen is flat.     Palpations: Abdomen is soft. There is no mass.     Tenderness: There is no abdominal tenderness.  Musculoskeletal:  General: Normal range of motion.     Cervical back: Neck supple.     Right lower leg: No edema.     Left lower leg: No edema.  Lymphadenopathy:     Cervical: No cervical adenopathy.  Skin:    General: Skin is warm and dry.  Neurological:     General: No focal deficit present.     Mental Status: He is alert.  Psychiatric:        Mood and Affect: Mood normal.    Lab Results  Component Value Date   WBC 6.0 01/31/2021   HGB 14.7 01/31/2021   HCT 42.8 01/31/2021   PLT 226.0 01/31/2021   GLUCOSE 111 (H) 12/26/2020   CHOL 175 02/28/2021   TRIG 143 02/28/2021   HDL 45 02/28/2021   LDLDIRECT 134.5 12/15/2006   LDLCALC 105  02/28/2021   ALT 41 04/11/2021   AST 52 (H) 04/11/2021   NA 139 12/26/2020   K 4.3 12/26/2020   CL 102 12/26/2020   CREATININE 0.72 12/26/2020   BUN 16 12/26/2020   CO2 25 12/26/2020   TSH 2.58 06/04/2021   PSA 0.7 01/19/2020   INR 1.1 (H) 01/31/2021   HGBA1C 5.9 06/04/2021    CT CARDIAC CALCIUM SCORING (DOCTOR'S DAY ONLY)  Addendum Date: 03/20/2021   ADDENDUM REPORT: 03/20/2021 12:13 CLINICAL DATA:  Risk stratification: 70 Year-old Caucasian male EXAM: Coronary Calcium Score TECHNIQUE: The patient was scanned on a [employer] scanner. Axial non-contrast 3 mm slices were carried out through the heart. The data set was analyzed on a dedicated work station and scored using the [place]. FINDINGS: Non-cardiac: See separate report from [facility]. Ascending Aorta: Normal caliber. Aortic Valve Calcium Score: 29 Pericardium: Normal. Coronary arteries: Normal origins. Coronary Calcium Score: Left main: 0 Left anterior descending artery: 15 Left circumflex artery: 0 Right coronary artery: 3 Total: 18 Percentile: 20th for age, sex, and race matched control. IMPRESSION: 1. Coronary calcium score of 18. This was 20th percentile for age, gender, and race matched controls. 2. Mild aortic valve calcification with calcium score: 29. RECOMMENDATIONS: Coronary artery calcium (CAC) score is a strong predictor of incident coronary heart disease (CHD) and provides predictive information beyond traditional risk factors. CAC scoring is reasonable to use in the decision to withhold, postpone, or initiate statin therapy in intermediate-risk or selected borderline-risk asymptomatic adults (age 58-75 years and LDL-C >=70 to <190 mg/dL) who do not have diabetes or established atherosclerotic cardiovascular disease (ASCVD).* In intermediate-risk (10-year ASCVD risk >=7.5% to <20%) adults or selected borderline-risk (10-year ASCVD risk >=5% to <7.5%) adults in whom a CAC score is measured for the purpose of  making a treatment decision the following recommendations have been made: If CAC = 0, it is reasonable to withhold statin therapy and reassess in 5 to 10 years, as long as higher risk conditions are absent (diabetes mellitus, family history of premature CHD in first degree relatives (males <55 years; females <65 years), cigarette smoking, LDL >=190 mg/dL or other independent risk factors). If CAC is 1 to 99, it is reasonable to initiate statin therapy for patients ?72 years of age. If CAC is >=100 or >=75th percentile, it is reasonable to initiate statin therapy at any age. Cardiology referral should be considered for patients with CAC scores =400 or >=75th percentile. *2018 AHA/ACC/AACVPR/AAPA/ABC/ACPM/ADA/AGS/APhA/ASPC/NLA/PCNA Guideline on the Management of Blood Cholesterol: A Report of the American College of Cardiology/American Heart Association Task Force on Clinical Practice Guidelines. J Am Coll Cardiol. 2019;73(24):3168-3209.  Rudean Haskell, William Luna Electronically Signed   By: Rudean Haskell William Luna   On: 03/20/2021 12:13   Result Date: 03/20/2021 EXAM: OVER-READ INTERPRETATION  CT CHEST The following report is an over-read performed by radiologist Dr. Vinnie Langton of [facility], [place] on 03/20/2021. This over-read does not include interpretation of cardiac or coronary anatomy or pathology. The coronary calcium score interpretation by the cardiologist is attached. COMPARISON:  None. FINDINGS: Within the visualized portions of the thorax there are no suspicious appearing pulmonary nodules or masses, there is no acute consolidative airspace disease, no pleural effusions, no pneumothorax and no lymphadenopathy. Visualized portions of the upper abdomen are unremarkable. There are no aggressive appearing lytic or blastic lesions noted in the visualized portions of the skeleton. IMPRESSION: No significant incidental noncardiac findings are noted. Electronically Signed: By: Vinnie Langton M.D. On:  03/20/2021 09:02    Assessment & Plan:   Lavance was seen today for hypothyroidism.  Diagnoses and all orders for this visit:  Prediabetes- Improvement noted.  His A1c is down to 5.9%. -     Hemoglobin A1c; Future -     Hemoglobin A1c  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 atrophy of thyroid- His TSH is in the normal range.  He will remain on the current dose of levothyroxine. -     levothyroxine (SYNTHROID) 112 MCG tablet; Take 1 tablet (112 mcg total) by mouth daily. -     TSH; Future -     TSH  I have discontinued William Bjornstad, William Luna "Jeff"'s dicyclomine. I have also changed his levothyroxine. Additionally, I am having him maintain his zaleplon, cetirizine, atorvastatin, sertraline, and ursodiol.  Meds ordered this encounter  Medications   levothyroxine (SYNTHROID) 112 MCG tablet    Sig: Take 1 tablet (112 mcg total) by mouth daily.    Dispense:  90 tablet    Refill:  0    Refill [id number]     Follow-up: Return in about 6 months (around 12/05/2021).  Scarlette Calico, William Luna

## 2021-06-13 DIAGNOSIS — M25562 Pain in left knee: Secondary | ICD-10-CM | POA: Diagnosis not present

## 2021-06-18 ENCOUNTER — Ambulatory Visit: Payer: Medicare Other | Admitting: Internal Medicine

## 2021-07-18 DIAGNOSIS — M25562 Pain in left knee: Secondary | ICD-10-CM | POA: Diagnosis not present

## 2021-08-05 DIAGNOSIS — Z23 Encounter for immunization: Secondary | ICD-10-CM | POA: Diagnosis not present

## 2021-08-07 ENCOUNTER — Other Ambulatory Visit: Payer: Self-pay | Admitting: Internal Medicine

## 2021-08-07 DIAGNOSIS — E034 Atrophy of thyroid (acquired): Secondary | ICD-10-CM

## 2021-08-08 ENCOUNTER — Encounter: Payer: Self-pay | Admitting: Internal Medicine

## 2021-08-08 ENCOUNTER — Other Ambulatory Visit: Payer: Self-pay | Admitting: Internal Medicine

## 2021-08-08 DIAGNOSIS — R4681 Obsessive-compulsive behavior: Secondary | ICD-10-CM

## 2021-08-08 DIAGNOSIS — K743 Primary biliary cirrhosis: Secondary | ICD-10-CM

## 2021-08-08 MED ORDER — URSODIOL 250 MG PO TABS: 500.0000 mg | ORAL_TABLET | Freq: Two times a day (BID) | ORAL | 1 refills | Status: DC

## 2021-08-08 MED ORDER — SERTRALINE HCL 50 MG PO TABS: 100.0000 mg | ORAL_TABLET | Freq: Every day | ORAL | 1 refills | Status: DC

## 2021-08-20 DIAGNOSIS — M948X6 Other specified disorders of cartilage, lower leg: Secondary | ICD-10-CM | POA: Diagnosis not present

## 2021-08-20 DIAGNOSIS — S83232A Complex tear of medial meniscus, current injury, left knee, initial encounter: Secondary | ICD-10-CM | POA: Diagnosis not present

## 2021-08-20 DIAGNOSIS — Y999 Unspecified external cause status: Secondary | ICD-10-CM | POA: Diagnosis not present

## 2021-08-20 DIAGNOSIS — M2242 Chondromalacia patellae, left knee: Secondary | ICD-10-CM | POA: Diagnosis not present

## 2021-08-20 DIAGNOSIS — G8918 Other acute postprocedural pain: Secondary | ICD-10-CM | POA: Diagnosis not present

## 2021-08-20 DIAGNOSIS — X58XXXA Exposure to other specified factors, initial encounter: Secondary | ICD-10-CM | POA: Diagnosis not present

## 2021-08-27 DIAGNOSIS — Z9889 Other specified postprocedural states: Secondary | ICD-10-CM | POA: Diagnosis not present

## 2021-08-27 DIAGNOSIS — M2242 Chondromalacia patellae, left knee: Secondary | ICD-10-CM | POA: Diagnosis not present

## 2021-10-04 ENCOUNTER — Ambulatory Visit (INDEPENDENT_AMBULATORY_CARE_PROVIDER_SITE_OTHER)
Admission: RE | Admit: 2021-10-04 | Discharge: 2021-10-04 | Disposition: A | Discharge status: Home / Self Care | Payer: Medicare Other | Source: Ambulatory Visit | Attending: Internal Medicine | Admitting: Internal Medicine

## 2021-10-04 ENCOUNTER — Other Ambulatory Visit: Payer: Self-pay

## 2021-10-04 ENCOUNTER — Ambulatory Visit (INDEPENDENT_AMBULATORY_CARE_PROVIDER_SITE_OTHER): Payer: Medicare Other | Admitting: Internal Medicine

## 2021-10-04 ENCOUNTER — Encounter: Payer: Self-pay | Admitting: Internal Medicine

## 2021-10-04 VITALS — BP 118/62 | HR 72 | Temp 97.7°F | Ht 68.0 in | Wt 165.0 lb

## 2021-10-04 DIAGNOSIS — S99921A Unspecified injury of right foot, initial encounter: Secondary | ICD-10-CM | POA: Diagnosis not present

## 2021-10-04 DIAGNOSIS — S91331A Puncture wound without foreign body, right foot, initial encounter: Secondary | ICD-10-CM

## 2021-10-04 DIAGNOSIS — Z23 Encounter for immunization: Secondary | ICD-10-CM | POA: Diagnosis not present

## 2021-10-04 NOTE — Progress Notes (Signed)
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 MRN: [id number]  CC: Puncture Wound  This visit occurred during the SARS-CoV-2 public health emergency.  Safety protocols were in place, including screening questions prior to the visit, additional usage of staff PPE, and extensive cleaning of exam room while observing appropriate contact time as indicated for disinfecting solutions.    HPI William Bjornstad, MD presents for f/up -  He stepped on nail while wearing shoes one day ago. The area does not both him.  Outpatient Medications Prior to Visit  Medication Sig Dispense Refill   atorvastatin (LIPITOR) 20 MG tablet Take 1 tablet (20 mg total) by mouth daily. 90 tablet 1   cetirizine (ZYRTEC) 10 MG tablet Take 10 mg by mouth daily.     levothyroxine (SYNTHROID) 112 MCG tablet Take 1 tablet by mouth daily 90 tablet 0   sertraline (ZOLOFT) 50 MG tablet Take 2 tablets (100 mg total) by mouth daily. 180 tablet 1   ursodiol (ACTIGALL) 250 MG tablet Take 2 tablets (500 mg total) by mouth 2 (two) times daily. 360 tablet 1   zaleplon (SONATA) 10 MG capsule Take 1 capsule (10 mg total) by mouth at bedtime as needed for sleep. 50 capsule 1   No facility-administered medications prior to visit.    ROS Review of Systems  Musculoskeletal:  Negative for arthralgias.  Skin:  Negative for color change.  Hematological:  Does not bruise/bleed easily.  All other systems reviewed and are negative.  Objective:  BP 118/62 (BP Location: Left Arm, Patient Position: Sitting, Cuff Size: Large)   Pulse 72   Temp 97.7 F (36.5 C) (Oral)   Ht 5\' 8"  (1.727 m)   Wt 165 lb (74.8 kg)   SpO2 97%   BMI 25.09 kg/m   BP Readings from Last 3 Encounters:  10/04/21 118/62  06/04/21 122/72  03/06/21 112/78    Wt Readings from Last 3 Encounters:  10/04/21 165 lb (74.8 kg)  06/04/21 159 lb (72.1 kg)  05/17/21 158 lb (71.7 kg)    Physical Exam Vitals reviewed.   Cardiovascular:     Pulses:          Dorsalis pedis pulses are 1+ on the right side and 1+ on the left side.       Posterior tibial pulses are 1+ on the right side and 1+ on the left side.  Musculoskeletal:       Feet:  Neurological:     Mental Status: He is alert.    Lab Results  Component Value Date   WBC 6.0 01/31/2021   HGB 14.7 01/31/2021   HCT 42.8 01/31/2021   PLT 226.0 01/31/2021   GLUCOSE 111 (H) 12/26/2020   CHOL 175 02/28/2021   TRIG 143 02/28/2021   HDL 45 02/28/2021   LDLDIRECT 134.5 12/15/2006   LDLCALC 105 02/28/2021   ALT 41 04/11/2021   AST 52 (H) 04/11/2021   NA 139 12/26/2020   K 4.3 12/26/2020   CL 102 12/26/2020   CREATININE 0.72 12/26/2020   BUN 16 12/26/2020   CO2 25 12/26/2020   TSH 2.58 06/04/2021   PSA 0.7 01/19/2020   INR 1.1 (H) 01/31/2021   HGBA1C 5.9 06/04/2021    CT CARDIAC CALCIUM SCORING (DOCTOR'S DAY ONLY)  Addendum Date: 03/20/2021   ADDENDUM REPORT: 03/20/2021 12:13 CLINICAL DATA:  Risk stratification: 70 Year-old Caucasian male EXAM: Coronary Calcium Score TECHNIQUE: The patient was  scanned on a [employer]. Axial non-contrast 3 mm slices were carried out through the heart. The data set was analyzed on a dedicated work station and scored using the [place]. FINDINGS: Non-cardiac: See separate report from [facility]. Ascending Aorta: Normal caliber. Aortic Valve Calcium Score: 29 Pericardium: Normal. Coronary arteries: Normal origins. Coronary Calcium Score: Left main: 0 Left anterior descending artery: 15 Left circumflex artery: 0 Right coronary artery: 3 Total: 18 Percentile: 20th for age, sex, and race matched control. IMPRESSION: 1. Coronary calcium score of 18. This was 20th percentile for age, gender, and race matched controls. 2. Mild aortic valve calcification with calcium score: 29. RECOMMENDATIONS: Coronary artery calcium (CAC) score is a strong predictor of incident coronary heart disease (CHD) and  provides predictive information beyond traditional risk factors. CAC scoring is reasonable to use in the decision to withhold, postpone, or initiate statin therapy in intermediate-risk or selected borderline-risk asymptomatic adults (age 20-75 years and LDL-C >=70 to <190 mg/dL) who do not have diabetes or established atherosclerotic cardiovascular disease (ASCVD).* In intermediate-risk (10-year ASCVD risk >=7.5% to <20%) adults or selected borderline-risk (10-year ASCVD risk >=5% to <7.5%) adults in whom a CAC score is measured for the purpose of making a treatment decision the following recommendations have been made: If CAC = 0, it is reasonable to withhold statin therapy and reassess in 5 to 10 years, as long as higher risk conditions are absent (diabetes mellitus, family history of premature CHD in first degree relatives (males <55 years; females <65 years), cigarette smoking, LDL >=190 mg/dL or other independent risk factors). If CAC is 1 to 99, it is reasonable to initiate statin therapy for patients ?73 years of age. If CAC is >=100 or >=75th percentile, it is reasonable to initiate statin therapy at any age. Cardiology referral should be considered for patients with CAC scores =400 or >=75th percentile. *2018 AHA/ACC/AACVPR/AAPA/ABC/ACPM/ADA/AGS/APhA/ASPC/NLA/PCNA Guideline on the Management of Blood Cholesterol: A Report of the American College of Cardiology/American Heart Association Task Force on Clinical Practice Guidelines. J Am Coll Cardiol. 2019;73(24):3168-3209. Rudean Haskell, MD Electronically Signed   By: Rudean Haskell MD   On: 03/20/2021 12:13   Result Date: 03/20/2021 EXAM: OVER-READ INTERPRETATION  CT CHEST The following report is an over-read performed by radiologist Dr. Vinnie Langton of [facility], [place] on 03/20/2021. This over-read does not include interpretation of cardiac or coronary anatomy or pathology. The coronary calcium score interpretation by the  cardiologist is attached. COMPARISON:  None. FINDINGS: Within the visualized portions of the thorax there are no suspicious appearing pulmonary nodules or masses, there is no acute consolidative airspace disease, no pleural effusions, no pneumothorax and no lymphadenopathy. Visualized portions of the upper abdomen are unremarkable. There are no aggressive appearing lytic or blastic lesions noted in the visualized portions of the skeleton. IMPRESSION: No significant incidental noncardiac findings are noted. Electronically Signed: By: Vinnie Langton M.D. On: 03/20/2021 09:02   DG Foot Complete Right  Result Date: 10/04/2021 CLINICAL DATA:  Puncture injury along the plantar foot yesterday EXAM: RIGHT FOOT COMPLETE - 3+ VIEW COMPARISON:  None. FINDINGS: No evidence of fracture or radiopaque foreign object. No abnormal finding. IMPRESSION: Negative radiographs.  No fracture or radiopaque foreign object. Electronically Signed   By: Nelson Chimes M.D.   On: 10/04/2021 11:54     Assessment & Plan:   Ramiro was seen today for puncture wound.  Diagnoses and all orders for this visit:  Puncture wound of plantar aspect of  right foot, initial encounter- Based on his symptoms, exam, and x-ray there were no complications to related to this.  I do not think antibiotics are indicated. He will monitor for infection.  Will update his tetanus. 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  No orders of the defined types were placed in this encounter.    Follow-up: Return if symptoms worsen or fail to improve.  Scarlette Calico, MD

## 2021-10-04 NOTE — Patient Instructions (Signed)
Puncture Wound A puncture wound is an injury that is caused by a sharp, thin object that goes through (penetrates) your skin. Usually, a puncture wound does not leave a large opening in your skin, so it may not bleed a lot. However, when you get a puncture wound, dirt or other materials (foreign bodies) can be forced into your wound and can break off inside. This increases the chance of infection, such as tetanus. There are many sharp, pointed objects that can cause puncture wounds, including teeth, nails, splinters of glass, fishhooks, and needles. Treatment may include washing out the wound with a germ-free (sterile) salt-water solution, having the wound opened surgically to remove a foreign object, closing the wound with stitches (sutures), and covering the wound with antibiotic ointment and a bandage (dressing). Depending on what caused the injury, you may also need a tetanus shot or a rabies shot. Follow these instructions at home: Medicines Take or apply over-the-counter and prescription medicines only as told by your health care provider. If you were prescribed an antibiotic medicine, take or apply it as told by your health care provider. Do not stop using the antibiotic even if your condition improves. Bathing Keep the dressing dry as told by your health care provider. Do not take baths, swim, or use a hot tub until your health care provider approves. Ask your health care provider if you may take showers. You may only be allowed to take sponge baths. Wound care  There are many ways to close and cover a wound. For example, a wound can be closed with sutures, skin glue, or adhesive strips. Follow instructions from your health care provider about how to take care of your wound. Make sure you: Wash your hands with soap and water before and after you change your dressing. If soap and water are not available, use hand sanitizer. Change your dressing as told by your health care provider. Leave  sutures, skin glue, or adhesive strips in place. These skin closures may need to stay in place for 2 weeks or longer. If adhesive strip edges start to loosen and curl up, you may trim the loose edges. Do not remove adhesive strips completely unless your health care provider tells you to do that. Clean the wound as told by your health care provider. Do not scratch or pick at the wound. Check your wound every day for signs of infection. Check for: Redness, swelling, or pain. Fluid or blood. Warmth. Pus or a bad smell. General instructions Raise (elevate) the injured area above the level of your heart while you are sitting or lying down. If your puncture wound is in your foot, ask your health care provider if you need to avoid putting weight on your foot and for how long. Do not use the injured limb to support your body weight until your health care provider says that you can. Use crutches as told by your health care provider. Keep all follow-up visits as told by your health care provider. This is important. Contact a health care provider if: You received a tetanus shot and you have swelling, severe pain, redness, or bleeding at the injection site. You have a fever. Your sutures come out. You notice a bad smell coming from your wound or your dressing. You notice something coming out of your wound, such as wood or glass. Your pain is not controlled with medicine. You have increased redness, swelling, or pain at the site of your wound. You have fluid, blood, or pus coming  from your wound. You notice a change in the color of your skin near your wound. You need to change the dressing frequently due to fluid, blood, or pus draining from your wound. You develop a new rash. You develop numbness around your wound. You have warmth around your wound. Get help right away if: You develop severe swelling around your wound. Your pain suddenly increases and is severe. You develop painful skin lumps. You  have a red streak going away from your wound. The wound is on your hand or foot and you: Cannot properly move a finger or toe. Notice that your fingers or toes look pale or bluish. Summary A puncture wound is an injury that is caused by a sharp, thin object that goes through (penetrates) your skin. Treatment may include washing out the wound, having the wound opened surgically to remove a foreign object, closing the wound with stitches (sutures), and covering the wound with antibiotic ointment and a bandage (dressing). Follow instructions from your health care provider about how to take care of your wound. Contact a health care provider if you have increased redness, swelling, or pain at the site of your wound. Keep all follow-up visits as told by your health care provider. This is important. This information is not intended to replace advice given to you by your health care provider. Make sure you discuss any questions you have with your health care provider. Document Revised: 03/14/2020 Document Reviewed: 06/11/2018 Elsevier Patient Education  [place].

## 2021-10-31 ENCOUNTER — Other Ambulatory Visit: Payer: Self-pay

## 2021-10-31 ENCOUNTER — Ambulatory Visit (INDEPENDENT_AMBULATORY_CARE_PROVIDER_SITE_OTHER): Payer: Medicare Other

## 2021-10-31 VITALS — BP 118/70 | HR 65 | Temp 98.2°F | Ht 68.0 in | Wt 166.0 lb

## 2021-10-31 DIAGNOSIS — Z0000 Encounter for general adult medical examination without abnormal findings: Secondary | ICD-10-CM

## 2021-10-31 NOTE — Patient Instructions (Signed)
William Luna , Thank you for taking time to come for your Medicare Wellness Visit. I appreciate your ongoing commitment to your health goals. Please review the following plan we discussed and let me know if I can assist you in the future.   Screening recommendations/referrals: Colonoscopy: 09/07/2018; due every 5 years Recommended yearly ophthalmology/optometry visit for glaucoma screening and checkup Recommended yearly dental visit for hygiene and checkup  Vaccinations: Influenza vaccine: 08/05/2021 Pneumococcal vaccine: 11/21/2015, 01/06/2018 Tdap vaccine: 10/04/2021; due every 10 years Shingles vaccine: 08/18/2018, 11/04/2018   Covid-19:12/01/2019, 12/21/2019, 08/16/2020, 04/30/2021, 08/05/2021  Advanced directives: Please bring a copy of your health care power of attorney and living will to the office at your convenience.  Conditions/risks identified: Yes; I would like to lose 10 pounds.  Next appointment: Please schedule your next Medicare Wellness Visit with your Nurse Health Advisor in 1 year by calling [phone].  Preventive Care 12 Years and Older, Male Preventive care refers to lifestyle choices and visits with your health care provider that can promote health and wellness. What does preventive care include? A yearly physical exam. This is also called an annual well check. Dental exams once or twice a year. Routine eye exams. Ask your health care provider how often you should have your eyes checked. Personal lifestyle choices, including: Daily care of your teeth and gums. Regular physical activity. Eating a healthy diet. Avoiding tobacco and drug use. Limiting alcohol use. Practicing safe sex. Taking low doses of aspirin every day. Taking vitamin and mineral supplements as recommended by your health care provider. What happens during an annual well check? The services and screenings done by your health care provider during your annual well check will depend on your age, overall  health, lifestyle risk factors, and family history of disease. Counseling  Your health care provider may ask you questions about your: Alcohol use. Tobacco use. Drug use. Emotional well-being. Home and relationship well-being. Sexual activity. Eating habits. History of falls. Memory and ability to understand (cognition). Work and work Statistician. Screening  You may have the following tests or measurements: Height, weight, and BMI. Blood pressure. Lipid and cholesterol levels. These may be checked every 5 years, or more frequently if you are over 14 years old. Skin check. Lung cancer screening. You may have this screening every year starting at age 84 if you have a 30-pack-year history of smoking and currently smoke or have quit within the past 15 years. Fecal occult blood test (FOBT) of the stool. You may have this test every year starting at age 31. Flexible sigmoidoscopy or colonoscopy. You may have a sigmoidoscopy every 5 years or a colonoscopy every 10 years starting at age 95. Prostate cancer screening. Recommendations will vary depending on your family history and other risks. Hepatitis C blood test. Hepatitis B blood test. Sexually transmitted disease (STD) testing. Diabetes screening. This is done by checking your blood sugar (glucose) after you have not eaten for a while (fasting). You may have this done every 1-3 years. Abdominal aortic aneurysm (AAA) screening. You may need this if you are a current or former smoker. Osteoporosis. You may be screened starting at age 41 if you are at high risk. Talk with your health care provider about your test results, treatment options, and if necessary, the need for more tests. Vaccines  Your health care provider may recommend certain vaccines, such as: Influenza vaccine. This is recommended every year. Tetanus, diphtheria, and acellular pertussis (Tdap, Td) vaccine. You may need a Td booster every  10 years. Zoster vaccine. You may  need this after age 69. Pneumococcal 13-valent conjugate (PCV13) vaccine. One dose is recommended after age 17. Pneumococcal polysaccharide (PPSV23) vaccine. One dose is recommended after age 51. Talk to your health care provider about which screenings and vaccines you need and how often you need them. This information is not intended to replace advice given to you by your health care provider. Make sure you discuss any questions you have with your health care provider. Document Released: 12/01/2015 Document Revised: 07/24/2016 Document Reviewed: 09/05/2015 Elsevier Interactive Patient Education  2017 [place] Prevention in the Home Falls can cause injuries. They can happen to people of all ages. There are many things you can do to make your home safe and to help prevent falls. What can I do on the outside of my home? Regularly fix the edges of walkways and driveways and fix any cracks. Remove anything that might make you trip as you walk through a door, such as a raised step or threshold. Trim any bushes or trees on the path to your home. Use bright outdoor lighting. Clear any walking paths of anything that might make someone trip, such as rocks or tools. Regularly check to see if handrails are loose or broken. Make sure that both sides of any steps have handrails. Any raised decks and porches should have guardrails on the edges. Have any leaves, snow, or ice cleared regularly. Use sand or salt on walking paths during winter. Clean up any spills in your garage right away. This includes oil or grease spills. What can I do in the bathroom? Use night lights. Install grab bars by the toilet and in the tub and shower. Do not use towel bars as grab bars. Use non-skid mats or decals in the tub or shower. If you need to sit down in the shower, use a plastic, non-slip stool. Keep the floor dry. Clean up any water that spills on the floor as soon as it happens. Remove soap buildup in  the tub or shower regularly. Attach bath mats securely with double-sided non-slip rug tape. Do not have throw rugs and other things on the floor that can make you trip. What can I do in the bedroom? Use night lights. Make sure that you have a light by your bed that is easy to reach. Do not use any sheets or blankets that are too big for your bed. They should not hang down onto the floor. Have a firm chair that has side arms. You can use this for support while you get dressed. Do not have throw rugs and other things on the floor that can make you trip. What can I do in the kitchen? Clean up any spills right away. Avoid walking on wet floors. Keep items that you use a lot in easy-to-reach places. If you need to reach something above you, use a strong step stool that has a grab bar. Keep electrical cords out of the way. Do not use floor polish or wax that makes floors slippery. If you must use wax, use non-skid floor wax. Do not have throw rugs and other things on the floor that can make you trip. What can I do with my stairs? Do not leave any items on the stairs. Make sure that there are handrails on both sides of the stairs and use them. Fix handrails that are broken or loose. Make sure that handrails are as long as the stairways. Check any carpeting to make  sure that it is firmly attached to the stairs. Fix any carpet that is loose or worn. Avoid having throw rugs at the top or bottom of the stairs. If you do have throw rugs, attach them to the floor with carpet tape. Make sure that you have a light switch at the top of the stairs and the bottom of the stairs. If you do not have them, ask someone to add them for you. What else can I do to help prevent falls? Wear shoes that: Do not have high heels. Have rubber bottoms. Are comfortable and fit you well. Are closed at the toe. Do not wear sandals. If you use a stepladder: Make sure that it is fully opened. Do not climb a closed  stepladder. Make sure that both sides of the stepladder are locked into place. Ask someone to hold it for you, if possible. Clearly mark and make sure that you can see: Any grab bars or handrails. First and last steps. Where the edge of each step is. Use tools that help you move around (mobility aids) if they are needed. These include: Canes. Walkers. Scooters. Crutches. Turn on the lights when you go into a dark area. Replace any light bulbs as soon as they burn out. Set up your furniture so you have a clear path. Avoid moving your furniture around. If any of your floors are uneven, fix them. If there are any pets around you, be aware of where they are. Review your medicines with your doctor. Some medicines can make you feel dizzy. This can increase your chance of falling. Ask your doctor what other things that you can do to help prevent falls. This information is not intended to replace advice given to you by your health care provider. Make sure you discuss any questions you have with your health care provider. Document Released: 08/31/2009 Document Revised: 04/11/2016 Document Reviewed: 12/09/2014 Elsevier Interactive Patient Education  2017 [employer].

## 2021-10-31 NOTE — Progress Notes (Signed)
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  Review of Systems     Cardiac Risk Factors include: advanced age (>64men, >3 women);family history of premature cardiovascular disease;diabetes mellitus;dyslipidemia;male gender     Objective:    Today's Vitals   10/31/21 1047  BP: 118/70  Pulse: 65  Temp: 98.2 F (36.8 C)  SpO2: 97%  Weight: 166 lb (75.3 kg)  Height: 5\' 8"  (1.727 m)  PainSc: 0-No pain   Body mass index is 25.24 kg/m.  Advanced Directives 10/31/2021 12/26/2020 09/06/2020 09/17/2018 01/06/2018 06/19/2017 10/02/2016  Does Patient Have a Medical Advance Directive? Yes Yes Yes Yes Yes No Yes  Type of Advance Directive Living will;Healthcare Power of [place];Living will [place];Living will [place];Living will [place];Living will - [place];Living will  Does patient want to make changes to medical advance directive? No - Patient declined - No - Patient declined No - Patient declined - - No - Patient declined  Copy of [place] in Chart? No - copy requested - No - copy requested No - copy requested No - copy requested - Yes  Pre-existing out of facility DNR order (yellow form or pink MOST form) - - - - - - -    Current Medications (verified) Outpatient Encounter Medications as of 10/31/2021  Medication Sig   atorvastatin (LIPITOR) 20 MG tablet Take 1 tablet (20 mg total) by mouth daily.   cetirizine (ZYRTEC) 10 MG tablet Take 10 mg by mouth daily.   levothyroxine (SYNTHROID) 112 MCG tablet Take 1 tablet by mouth daily   sertraline (ZOLOFT) 50 MG tablet Take 2 tablets (100 mg total) by mouth daily.   ursodiol (ACTIGALL) 250 MG tablet Take 2 tablets (500 mg total) by mouth 2 (two) times daily.   zaleplon (SONATA) 10 MG capsule Take 1 capsule (10 mg total) by mouth at  bedtime as needed for sleep.   No facility-administered encounter medications on file as of 10/31/2021.    Allergies (verified) Patient has no known allergies.   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 ([place])   mid-tongue squamous cell - excised, clean margins, 11 nodes negative, no adjuvant therapy   TRANSAMINASES, SERUM, ELEVATED    Past Surgical History:  Procedure Laterality Date   BACK SURGERY  '90s ([place])   CERVICAL DISC - diskectomy w/ fusion, anterior   CHOLECYSTECTOMY N/A 01/05/2021   Procedure: LAPAROSCOPIC CHOLECYSTECTOMY WITH INTRAOPERATIVE CHOLANGIOGRAM;  Surgeon: Johnathan Hausen, MD;  Location: [facility];  Service: General;  Laterality: N/A;   COLONOSCOPY     Multiple   ESOPHAGOGASTRODUODENOSCOPY     Multiple  HEMIGLOSSECTOMY  10/30/2011   Procedure: HEMIGLOSSECTOMY;  Surgeon: Cecil Cranker;  Location: [facility];  Service: ENT;  Laterality: Right;  Right tongue resection   LUMBAR LAMINECTOMY/DECOMPRESSION MICRODISCECTOMY  01/29/2012   Procedure: LUMBAR LAMINECTOMY/DECOMPRESSION MICRODISCECTOMY;  Surgeon: Kristeen Miss, MD;  Location: [facility];  Service: Neurosurgery;  Laterality: Left;  Left L5-S1 Microdiskectomy   LUMBAR LAMINECTOMY/DECOMPRESSION MICRODISCECTOMY  07/13/2012   Procedure: LUMBAR LAMINECTOMY/DECOMPRESSION MICRODISCECTOMY 1 LEVEL;  Surgeon: Kristeen Miss, MD;   Location: [facility];  Service: Neurosurgery;  Laterality: Left;  Redo Left Lumbar five-sacral one Microdiskectomy   nail avulsion     right first finger due to a wart   RADICAL NECK DISSECTION  10/30/2011   Procedure: RADICAL NECK DISSECTION;  Surgeon: Cecil Cranker;  Location: [facility];  Service: ENT;  Laterality: Right;   Family History  Problem Relation Age of Onset   Diabetes Mother    Anemia Mother    Cirrhosis Mother 13       primary biliary cirrhosis   Hyperlipidemia Father    Hypertension Father    Heart disease Father    Colon cancer Maternal Grandfather    COPD Neg Hx    Social History   Socioeconomic History   Marital status: Married    Spouse name: [name]   Number of children: 2   Years of education: 27   Highest education level: Not on file  Occupational History   Occupation: Secretary/administrator: [employer]  Tobacco Use   Smoking status: Never   Smokeless tobacco: Never  Vaping Use   Vaping Use: Never used  Substance and Sexual Activity   Alcohol use: Yes    Alcohol/week: 0.0 standard drinks    Comment: OCC.   Drug use: No   Sexual activity: Yes    Partners: Female    Birth control/protection: None  Other Topics Concern   Not on file  Social History Narrative   [employer] [ [place];; scholar}; Shary [place],   [employer] residency/fellowship.    Married- 1977 - 25 years/divorced;  Married 2006.    1 son  b1980, 1 daughter - [phone]; Pearson Grippe [phone],  Step-daughter [phone].    Grandchildren - 2 (b'2010, 2013).    Work - Oncologist. Retired 2016   ACP/Living will:  Wife  with healthcare POA; Yes - CPR, Yes - short-term mechanical ventilation,  no futile care.   Patient no alcohol no tobacco or drug use   Social Determinants of Radio broadcast assistant Strain: Low Risk    Difficulty of Paying Living Expenses: Not hard at all  Food Insecurity: No Food Insecurity   Worried About Charity fundraiser in the Last Year: Never true   [place] in the Last Year: Never true  Transportation Needs: No Transportation Needs   Lack of Transportation (Medical): No   Lack of Transportation (Non-Medical): No  Physical Activity: Sufficiently Active   Days of Exercise per Week: 5 days   Minutes of Exercise per Session: 30 min  Stress: No Stress Concern Present   Feeling of Stress : Not at all  Social Connections: Socially Integrated   Frequency of Communication with Friends and Family: More than three times a week   Frequency of Social Gatherings with Friends and Family: More than three times a week   Attends Religious Services: More than 4 times per year   Active Member of [employer] or Organizations: Not on file  Attends Music therapist: More than 4 times per year   Marital Status: Married    Tobacco Counseling Counseling given: Not Answered   Clinical Intake:  Pre-visit preparation completed: Yes  Pain : No/denies pain Pain Score: 0-No pain     BMI - recorded: 25.24 Nutritional Status: BMI 25 -29 Overweight Nutritional Risks: None Diabetes: No  How often do you need to have someone help you when you read instructions, pamphlets, or other written materials from your doctor or pharmacy?: 1 - Never What is the 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 no  Interpreter Needed?: No  Information entered by :: Lisette Abu, LPN   Activities of Daily Living In your present state of health, do you have any difficulty performing the following activities: 10/31/2021 12/26/2020  Hearing? Y Y  Comment left ear -  Vision? N N  Difficulty concentrating or making decisions? N N  Walking or climbing stairs? N N  Dressing or bathing? N N  Doing errands, shopping? N N  Preparing Food and eating ? N -  Using the Toilet? N -  In the past six months, have you accidently leaked urine? N -  Do you have problems with loss of bowel control? N -  Managing your  Medications? N -  Managing your Finances? N -  Housekeeping or managing your Housekeeping? N -  Some recent data might be hidden    Patient Care Team: Janith Lima, MD as PCP - General (Internal Medicine) Lavonna Monarch, MD (Dermatology) Nobie Putnam, MD (Hematology and Oncology) Kristeen Miss, MD (Neurosurgery) Melissa Montane, MD (Otolaryngology) Vicie Mutters, MD Charlton Haws, [facility] as Pharmacist (Pharmacist) Macarthur Critchley, OD as Referring Physician (Optometry) Ian Bushman, MD as Referring Physician (Transplant Hepatology) Gatha Mayer, MD as Consulting Physician (Gastroenterology) Melrose Nakayama, MD as Consulting Physician (Orthopedic Surgery)  Indicate any recent Medical Services you may have received from other than [facility] providers in the past year (date may be approximate).     Assessment:   This is a routine wellness examination for [place].  Hearing/Vision screen Hearing Screening - Comments:: Patient has decreased hearing in the left ear. Has hearing aids. Vision Screening - Comments:: Patient wears corrective glasses/contacts.  Eye exam done annually by: Bing Quarry. Scott, OD.  Dietary issues and exercise activities discussed: Current Exercise Habits: Home exercise routine, Type of exercise: walking;Other - see comments (skiing, riding bicycle), Time (Minutes): 30, Frequency (Times/Week): 5, Weekly Exercise (Minutes/Week): 150, Intensity: Moderate, Exercise limited by: None identified   Goals Addressed               This Visit's Progress     Patient Stated (pt-stated)        My goal is to lose 10 pounds.      Depression Screen PHQ 2/9 Scores 10/31/2021 10/04/2021 07/20/2020 07/19/2019 01/06/2018 10/02/2016 08/01/2016  PHQ - 2 Score 0 0 0 0 1 0 0  PHQ- 9 Score - - - 0 1 - -    Fall Risk Fall Risk  10/04/2021 09/06/2020 07/20/2020 07/19/2019 06/18/2019  Falls in the past year? 0 0 0 0 0  Comment - - - - Emmi Telephone Survey: data to providers prior to load  Number  falls in past yr: 0 0 - 0 -  Injury with Fall? 0 0 - 0 -  Risk for fall due to : No Fall Risks No Fall Risks - - -  Follow up  Falls evaluation completed Falls evaluation completed - Falls evaluation completed -    FALL RISK PREVENTION PERTAINING TO THE HOME:  Any stairs in or around the home? Yes  If so, are there any without handrails? No  Home free of loose throw rugs in walkways, pet beds, electrical cords, etc? Yes  Adequate lighting in your home to reduce risk of falls? Yes   ASSISTIVE DEVICES UTILIZED TO PREVENT FALLS:  Life alert? No  Use of a cane, walker or w/c? No  Grab bars in the bathroom? Yes  Shower chair or bench in shower? Yes  Elevated toilet seat or a handicapped toilet? Yes   TIMED UP AND GO:  Was the test performed? Yes .  Length of time to ambulate 10 feet: 6 sec.   Gait steady and fast without use of assistive device  Cognitive Function: Normal cognitive status assessed by direct observation by this Nurse Health Advisor. No abnormalities found.   MMSE - Mini Mental State Exam 01/06/2018  Not completed: Refused        Immunizations Immunization History  Administered Date(s) Administered   Fluad Quad(high Dose 65+) 07/19/2019, 08/05/2021   Hepatitis A 11/13/2016   Hepatitis A, Adult 04/19/2016   Hepatitis B 04/10/1989, 05/14/1989, 10/22/1989   Influenza,inj,Quad PF,6+ Mos 08/26/2011, 08/26/2012, 08/05/2013, 08/04/2014, 07/19/2017, 07/20/2020   Influenza-Unspecified 08/26/2011, 08/19/2015, 08/15/2016, 07/19/2018   PFIZER(Purple Top)SARS-COV-2 Vaccination 12/01/2019, 12/21/2019, 08/16/2020, 04/30/2021   PPD Test 02/19/2005, 03/04/2006, 03/05/2007, 04/15/2008, 02/20/2009, 05/03/2010, 05/07/2010   Pfizer Covid-19 Vaccine Bivalent Booster 35yrs & up 08/05/2021   Pneumococcal Conjugate-13 10/09/2015, 11/21/2015   Pneumococcal Polysaccharide-23 01/06/2018   Td 11/18/1998   Tdap 07/24/2012, 10/04/2021   Typhoid Live 04/19/2016   Zoster Recombinat  (Shingrix) 08/18/2018, 11/04/2018   Zoster, Live 10/20/2012    TDAP status: Up to date  Flu Vaccine status: Up to date  Pneumococcal vaccine status: Up to date  Covid-19 vaccine status: Completed vaccines  Qualifies for Shingles Vaccine? Yes   Zostavax completed Yes   Shingrix Completed?: Yes  Screening Tests Health Maintenance  Topic Date Due   COLONOSCOPY (Pts 45-92yrs Insurance coverage will need to be confirmed)  09/08/2023   TETANUS/TDAP  10/05/2031   Pneumonia Vaccine 69+ Years old  Completed   INFLUENZA VACCINE  Completed   COVID-19 Vaccine  Completed   Hepatitis C Screening  Completed   Zoster Vaccines- Shingrix  Completed   HPV VACCINES  Aged Out    Health Maintenance  There are no preventive care reminders to display for this patient.   Colorectal cancer screening: Type of screening: Colonoscopy. Completed 09/07/2018. Repeat every 5 years  Lung Cancer Screening: (Low Dose CT Chest recommended if Age 9-80 years, 30 pack-year currently smoking OR have quit w/in 15years.) does not qualify.   Lung Cancer Screening Referral: no  Additional Screening:  Hepatitis C Screening: does qualify; Completed yes  Vision Screening: Recommended annual ophthalmology exams for early detection of glaucoma and other disorders of the eye. Is the patient up to date with their annual eye exam?  Yes  Who is the provider or what is the name of the office in which the patient attends annual eye exams? Bing Quarry Scott, OD If pt is not established with a provider, would they like to be referred to a provider to establish care? No .   Dental Screening: Recommended annual dental exams for proper oral hygiene  Community Resource Referral / Chronic Care Management: CRR required this visit?  No   CCM required  this visit?  No      Plan:     I have personally reviewed and noted the following in the patients chart:   Medical and social history Use of alcohol, tobacco or illicit  drugs  Current medications and supplements including opioid prescriptions. Patient is not currently taking opioid prescriptions. Functional ability and status Nutritional status Physical activity Advanced directives List of other physicians Hospitalizations, surgeries, and ER visits in previous 12 months Vitals Screenings to include cognitive, depression, and falls Referrals and appointments  In addition, I have reviewed and discussed with patient certain preventive protocols, quality metrics, and best practice recommendations. A written personalized care plan for preventive services as well as general preventive health recommendations were provided to patient.     Sheral Flow, LPN   [id number]   Nurse Notes:  Hearing Screening - Comments:: Patient has decreased hearing in the left ear. Has hearing aids. Vision Screening - Comments:: Patient wears corrective glasses/contacts.  Eye exam done annually by: Bing Quarry. Scott, OD.

## 2021-11-12 ENCOUNTER — Encounter: Payer: Self-pay | Admitting: Internal Medicine

## 2021-11-12 DIAGNOSIS — E785 Hyperlipidemia, unspecified: Secondary | ICD-10-CM

## 2021-11-12 DIAGNOSIS — R4681 Obsessive-compulsive behavior: Secondary | ICD-10-CM

## 2021-11-12 DIAGNOSIS — E034 Atrophy of thyroid (acquired): Secondary | ICD-10-CM

## 2021-11-13 ENCOUNTER — Encounter: Payer: Self-pay | Admitting: Internal Medicine

## 2021-11-13 DIAGNOSIS — R4681 Obsessive-compulsive behavior: Secondary | ICD-10-CM

## 2021-11-13 DIAGNOSIS — E785 Hyperlipidemia, unspecified: Secondary | ICD-10-CM

## 2021-11-13 DIAGNOSIS — E034 Atrophy of thyroid (acquired): Secondary | ICD-10-CM

## 2021-11-13 MED ORDER — ATORVASTATIN CALCIUM 20 MG PO TABS: 20.0000 mg | ORAL_TABLET | Freq: Every day | ORAL | 1 refills | Status: DC

## 2021-11-13 MED ORDER — LEVOTHYROXINE SODIUM 112 MCG PO TABS: 112.0000 ug | ORAL_TABLET | Freq: Every day | ORAL | 0 refills | Status: DC

## 2021-11-13 MED ORDER — SERTRALINE HCL 50 MG PO TABS: 100.0000 mg | ORAL_TABLET | Freq: Every day | ORAL | 1 refills | Status: DC

## 2021-11-13 NOTE — Telephone Encounter (Signed)
I have dropped the requested Rx's and updated pharmacy.

## 2021-12-06 ENCOUNTER — Ambulatory Visit (INDEPENDENT_AMBULATORY_CARE_PROVIDER_SITE_OTHER): Payer: Medicare Other | Admitting: Internal Medicine

## 2021-12-06 ENCOUNTER — Encounter: Payer: Self-pay | Admitting: Internal Medicine

## 2021-12-06 ENCOUNTER — Other Ambulatory Visit: Payer: Self-pay

## 2021-12-06 VITALS — BP 104/66 | HR 69 | Temp 97.9°F | Ht 68.0 in | Wt 164.0 lb

## 2021-12-06 DIAGNOSIS — E034 Atrophy of thyroid (acquired): Secondary | ICD-10-CM | POA: Diagnosis not present

## 2021-12-06 DIAGNOSIS — R7303 Prediabetes: Secondary | ICD-10-CM | POA: Diagnosis not present

## 2021-12-06 DIAGNOSIS — K743 Primary biliary cirrhosis: Secondary | ICD-10-CM

## 2021-12-06 DIAGNOSIS — N400 Enlarged prostate without lower urinary tract symptoms: Secondary | ICD-10-CM

## 2021-12-06 DIAGNOSIS — E559 Vitamin D deficiency, unspecified: Secondary | ICD-10-CM | POA: Diagnosis not present

## 2021-12-06 DIAGNOSIS — E785 Hyperlipidemia, unspecified: Secondary | ICD-10-CM

## 2021-12-06 LAB — BASIC METABOLIC PANEL
BUN: 18 mg/dL (ref 6–23)
CO2: 30 mEq/L (ref 19–32)
Calcium: 9.7 mg/dL (ref 8.4–10.5)
Chloride: 104 mEq/L (ref 96–112)
Creatinine, Ser: 1 mg/dL (ref 0.40–1.50)
GFR: 75.05 mL/min (ref >60.00)
Glucose, Bld: 97 mg/dL (ref 70–99)
Potassium: 4.6 mEq/L (ref 3.5–5.1)
Sodium: 143 mEq/L (ref 135–145)

## 2021-12-06 LAB — HEPATIC FUNCTION PANEL
ALT: 45 U/L (ref 0–53)
AST: 45 U/L — ABNORMAL HIGH (ref 0–37)
Albumin: 4.6 g/dL (ref 3.5–5.2)
Alkaline Phosphatase: 85 U/L (ref 39–117)
Bilirubin, Direct: 0.2 mg/dL (ref 0.0–0.3)
Total Bilirubin: 0.7 mg/dL (ref 0.2–1.2)
Total Protein: 7.6 g/dL (ref 6.0–8.3)

## 2021-12-06 LAB — APTT: aPTT: 32.8 s — ABNORMAL HIGH (ref 23.4–32.7)

## 2021-12-06 LAB — TSH: TSH: 1.82 u[IU]/mL (ref 0.35–5.50)

## 2021-12-06 LAB — PSA: PSA: 1.02 ng/mL (ref 0.10–4.00)

## 2021-12-06 LAB — GAMMA GT: GGT: 61 U/L — ABNORMAL HIGH (ref 7–51)

## 2021-12-06 LAB — LIPID PANEL
Cholesterol: 153 mg/dL (ref 0–200)
HDL: 46 mg/dL (ref >39.00)
LDL Cholesterol: 93 mg/dL (ref 0–99)
NonHDL: 107.44
Total CHOL/HDL Ratio: 3
Triglycerides: 72 mg/dL (ref 0.0–149.0)
VLDL: 14.4 mg/dL (ref 0.0–40.0)

## 2021-12-06 LAB — HEMOGLOBIN A1C: Hgb A1c MFr Bld: 6.2 % (ref 4.6–6.5)

## 2021-12-06 LAB — CK: Total CK: 132 U/L (ref 7–232)

## 2021-12-06 LAB — PROTIME-INR
INR: 1.1 ratio — ABNORMAL HIGH (ref 0.8–1.0)
Prothrombin Time: 12.2 s (ref 9.6–13.1)

## 2021-12-06 MED ORDER — VITAMIN D3 1.25 MG (50000 UT) PO TABS: 1.0000 | ORAL_TABLET | ORAL | 1 refills | Status: DC

## 2021-12-06 NOTE — Progress Notes (Signed)
Subjective:  Patient ID: William Bjornstad, MD, male    DOB: Nov 14, 1951  Age: 71 y.o. MRN: [id number]  CC: Hypothyroidism  This visit occurred during the SARS-CoV-2 public health emergency.  Safety protocols were in place, including screening questions prior to the visit, additional usage of staff PPE, and extensive cleaning of exam room while observing appropriate contact time as indicated for disinfecting solutions.    HPI William Bjornstad, MD presents for f/up -  He is very active.  Recently went skiing.  He denies chest pain, shortness of breath, diaphoresis, dizziness, or lightheadedness.  Outpatient Medications Prior to Visit  Medication Sig Dispense Refill   atorvastatin (LIPITOR) 20 MG tablet Take 1 tablet (20 mg total) by mouth daily. 90 tablet 1   cetirizine (ZYRTEC) 10 MG tablet Take 10 mg by mouth daily.     sertraline (ZOLOFT) 50 MG tablet Take 2 tablets (100 mg total) by mouth daily. 180 tablet 1   ursodiol (ACTIGALL) 250 MG tablet Take 2 tablets (500 mg total) by mouth 2 (two) times daily. 360 tablet 1   zaleplon (SONATA) 10 MG capsule Take 1 capsule (10 mg total) by mouth at bedtime as needed for sleep. 50 capsule 1   levothyroxine (SYNTHROID) 112 MCG tablet Take 1 tablet (112 mcg total) by mouth daily. 90 tablet 0   No facility-administered medications prior to visit.    ROS Review of Systems  Constitutional:  Negative for appetite change, diaphoresis, fatigue and unexpected weight change.  HENT: Negative.    Eyes: Negative.   Respiratory:  Negative for cough, chest tightness, shortness of breath and wheezing.   Cardiovascular:  Negative for chest pain, palpitations and leg swelling.  Gastrointestinal:  Negative for abdominal pain, constipation, diarrhea, nausea and vomiting.  Endocrine: Negative.   Genitourinary: Negative.  Negative for difficulty urinating.  Musculoskeletal:  Positive for arthralgias. Negative for myalgias.  Skin: Negative.   Neurological:   Negative for dizziness and weakness.  Hematological:  Negative for adenopathy. Does not bruise/bleed easily.  Psychiatric/Behavioral: Negative.     Objective:  BP 104/66 (BP Location: Left Arm, Patient Position: Sitting, Cuff Size: Large)    Pulse 69    Temp 97.9 F (36.6 C) (Oral)    Ht 5\' 8"  (1.727 m)    Wt 164 lb (74.4 kg)    SpO2 96%    BMI 24.94 kg/m   BP Readings from Last 3 Encounters:  12/06/21 104/66  10/31/21 118/70  10/04/21 118/62    Wt Readings from Last 3 Encounters:  12/06/21 164 lb (74.4 kg)  10/31/21 166 lb (75.3 kg)  10/04/21 165 lb (74.8 kg)    Physical Exam Vitals reviewed.  HENT:     Nose: Nose normal.     Mouth/Throat:     Mouth: Mucous membranes are moist.  Eyes:     General: No scleral icterus.    Conjunctiva/sclera: Conjunctivae normal.  Cardiovascular:     Rate and Rhythm: Normal rate and regular rhythm.     Heart sounds: No murmur heard. Pulmonary:     Effort: Pulmonary effort is normal.     Breath sounds: No stridor. No wheezing, rhonchi or rales.  Abdominal:     General: Abdomen is flat.     Palpations: There is no mass.     Tenderness: There is no abdominal tenderness. There is no guarding.     Hernia: No hernia is present.  Musculoskeletal:        General: Normal  range of motion.     Cervical back: Neck supple.     Right lower leg: No edema.     Left lower leg: No edema.  Lymphadenopathy:     Cervical: No cervical adenopathy.  Skin:    General: Skin is warm and dry.  Neurological:     General: No focal deficit present.     Mental Status: He is alert.  Psychiatric:        Mood and Affect: Mood normal.        Behavior: Behavior normal.    Lab Results  Component Value Date   WBC 6.0 01/31/2021   HGB 14.7 01/31/2021   HCT 42.8 01/31/2021   PLT 226.0 01/31/2021   GLUCOSE 97 12/06/2021   CHOL 153 12/06/2021   TRIG 72.0 12/06/2021   HDL 46.00 12/06/2021   LDLDIRECT 134.5 12/15/2006   LDLCALC 93 12/06/2021   ALT 45  12/06/2021   AST 45 (H) 12/06/2021   NA 143 12/06/2021   K 4.6 12/06/2021   CL 104 12/06/2021   CREATININE 1.00 12/06/2021   BUN 18 12/06/2021   CO2 30 12/06/2021   TSH 1.82 12/06/2021   PSA 1.02 12/06/2021   INR 1.1 (H) 12/06/2021   HGBA1C 6.2 12/06/2021    DG Foot Complete Right  Result Date: 10/04/2021 CLINICAL DATA:  Puncture injury along the plantar foot yesterday EXAM: RIGHT FOOT COMPLETE - 3+ VIEW COMPARISON:  None. FINDINGS: No evidence of fracture or radiopaque foreign object. No abnormal finding. IMPRESSION: Negative radiographs.  No fracture or radiopaque foreign object. Electronically Signed   By: Nelson Chimes M.D.   On: 10/04/2021 11:54    Assessment & Plan:   Vickey was seen today for hypothyroidism.  Diagnoses and all orders for this visit:  Hyperlipidemia LDL goal <130- LDL goal achieved. Doing well on the statin  -     Lipid panel; Future -     Vitamin A; Future -     Vitamin E; Future -     APTT; Future -     CK; Future -     CK -     APTT -     Vitamin E -     Vitamin A -     Lipid panel  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 atrophy of thyroid- His TSH is in the normal range.  He will stay on the current T4 dosage. -     TSH; Future -     Vitamin A; Future -     Vitamin E; Future -     APTT; Future -     CK; Future -     CK -     APTT -     Vitamin E -     Vitamin A -     TSH -     levothyroxine (SYNTHROID) 112 MCG tablet; Take 1 tablet (112 mcg total) by mouth daily.  Benign prostatic hyperplasia without lower urinary tract symptoms- His PSA is normal.  He will see a urologist soon. -     PSA; Future -     Vitamin A; Future -     Vitamin E; Future -     APTT; Future -     CK; Future -     CK -     APTT -     Vitamin E -     Vitamin A -     PSA  Primary biliary cholangitis ([place])- His LFTs are stable. -  Gamma GT; Future -     Protime-INR; Future -     Hepatic function panel; Future -     Vitamin A; Future -     Vitamin E; Future -      APTT; Future -     CK; Future -     CK -     APTT -     Vitamin E -     Vitamin A -     Hepatic function panel -     Protime-INR -     Gamma GT  Prediabetes- His A1c has risen to 6.2%.  Medical therapy is not yet indicated. -     Basic metabolic panel; Future -     Hemoglobin A1c; Future -     Vitamin A; Future -     Vitamin E; Future -     APTT; Future -     CK; Future -     CK -     APTT -     Vitamin E -     Vitamin A -     Hemoglobin A1c -     Basic metabolic panel  Vitamin D deficiency -     Cholecalciferol (VITAMIN D3) 1.25 MG (50000 UT) TABS; Take 1 tablet by mouth once a week. 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 I am also having him maintain his zaleplon, cetirizine, ursodiol, atorvastatin, sertraline, and levothyroxine.  Meds ordered this encounter  Medications   Cholecalciferol (VITAMIN D3) 1.25 MG (50000 UT) TABS    Sig: Take 1 tablet by mouth once a week.    Dispense:  12 tablet    Refill:  1   levothyroxine (SYNTHROID) 112 MCG tablet    Sig: Take 1 tablet (112 mcg total) by mouth daily.    Dispense:  90 tablet    Refill:  1     Follow-up: Return in about 6 months (around 06/05/2022).  Scarlette Calico, MD

## 2021-12-06 NOTE — Patient Instructions (Signed)

## 2021-12-07 ENCOUNTER — Other Ambulatory Visit: Payer: Self-pay | Admitting: Internal Medicine

## 2021-12-07 DIAGNOSIS — E034 Atrophy of thyroid (acquired): Secondary | ICD-10-CM

## 2021-12-07 MED ORDER — LEVOTHYROXINE SODIUM 112 MCG PO TABS: 112.0000 ug | ORAL_TABLET | Freq: Every day | ORAL | 1 refills | Status: DC

## 2021-12-09 ENCOUNTER — Encounter: Payer: Self-pay | Admitting: Internal Medicine

## 2021-12-09 LAB — VITAMIN E
Gamma-Tocopherol (Vit E): <1 mg/L (ref <=4.3)
Vitamin E (Alpha Tocopherol): 9.4 mg/L (ref 5.7–19.9)

## 2021-12-09 LAB — VITAMIN A: Vitamin A (Retinoic Acid): 52 ug/dL (ref 38–98)

## 2021-12-10 ENCOUNTER — Encounter: Payer: Self-pay | Admitting: Internal Medicine

## 2021-12-10 ENCOUNTER — Other Ambulatory Visit (INDEPENDENT_AMBULATORY_CARE_PROVIDER_SITE_OTHER): Payer: Medicare Other

## 2021-12-10 ENCOUNTER — Other Ambulatory Visit: Payer: Self-pay

## 2021-12-10 DIAGNOSIS — E559 Vitamin D deficiency, unspecified: Secondary | ICD-10-CM

## 2021-12-10 LAB — VITAMIN D 25 HYDROXY (VIT D DEFICIENCY, FRACTURES): VITD: 42.61 ng/mL (ref 30.00–100.00)

## 2021-12-10 NOTE — Addendum Note (Signed)
Addended by: Janith Lima on: 12/10/2021 08:34 AM   Modules accepted: Orders

## 2021-12-11 ENCOUNTER — Other Ambulatory Visit: Payer: Self-pay | Admitting: Internal Medicine

## 2022-01-01 ENCOUNTER — Encounter: Payer: Self-pay | Admitting: Internal Medicine

## 2022-01-01 DIAGNOSIS — E034 Atrophy of thyroid (acquired): Secondary | ICD-10-CM

## 2022-01-01 MED ORDER — LEVOTHYROXINE SODIUM 112 MCG PO TABS: 112.0000 ug | ORAL_TABLET | Freq: Every day | ORAL | 1 refills | Status: DC

## 2022-01-07 DIAGNOSIS — M65341 Trigger finger, right ring finger: Secondary | ICD-10-CM | POA: Diagnosis not present

## 2022-03-06 ENCOUNTER — Encounter: Payer: Self-pay | Admitting: Internal Medicine

## 2022-03-07 ENCOUNTER — Other Ambulatory Visit: Payer: Self-pay | Admitting: Internal Medicine

## 2022-03-07 DIAGNOSIS — E785 Hyperlipidemia, unspecified: Secondary | ICD-10-CM

## 2022-03-07 DIAGNOSIS — E034 Atrophy of thyroid (acquired): Secondary | ICD-10-CM

## 2022-03-07 DIAGNOSIS — R4681 Obsessive-compulsive behavior: Secondary | ICD-10-CM

## 2022-03-07 DIAGNOSIS — K743 Primary biliary cirrhosis: Secondary | ICD-10-CM

## 2022-03-07 MED ORDER — LEVOTHYROXINE SODIUM 112 MCG PO TABS: 112.0000 ug | ORAL_TABLET | Freq: Every day | ORAL | 0 refills | Status: DC

## 2022-03-07 MED ORDER — SERTRALINE HCL 50 MG PO TABS: 100.0000 mg | ORAL_TABLET | Freq: Every day | ORAL | 0 refills | Status: DC

## 2022-03-07 MED ORDER — URSODIOL 250 MG PO TABS: 500.0000 mg | ORAL_TABLET | Freq: Every day | ORAL | 0 refills | Status: DC

## 2022-03-07 MED ORDER — ATORVASTATIN CALCIUM 20 MG PO TABS: 20.0000 mg | ORAL_TABLET | Freq: Every day | ORAL | 0 refills | Status: DC

## 2022-03-08 DIAGNOSIS — K754 Autoimmune hepatitis: Secondary | ICD-10-CM | POA: Diagnosis not present

## 2022-03-08 DIAGNOSIS — K743 Primary biliary cirrhosis: Secondary | ICD-10-CM | POA: Diagnosis not present

## 2022-03-14 ENCOUNTER — Other Ambulatory Visit: Payer: Self-pay | Admitting: Internal Medicine

## 2022-03-14 DIAGNOSIS — K743 Primary biliary cirrhosis: Secondary | ICD-10-CM

## 2022-03-14 MED ORDER — URSODIOL 500 MG PO TABS: 1000.0000 mg | ORAL_TABLET | Freq: Every day | ORAL | 1 refills | Status: DC

## 2022-03-18 IMAGING — RF DG CHOLANGIOGRAM OPERATIVE
1 series · 5 of 5 positions shown · non-contrast
Comparison: None.

CLINICAL DATA: 69-year-old male with a history of cholelithiasis

EXAM:
INTRAOPERATIVE CHOLANGIOGRAM
TECHNIQUE: Cholangiographic images from the C-arm fluoroscopic device were
submitted for interpretation post-operatively. Please see the
procedural report for the amount of contrast and the fluoroscopy
time utilized.

[Series 1: run · 2 acquisitions, 5 frames shown]
[im 1/2]
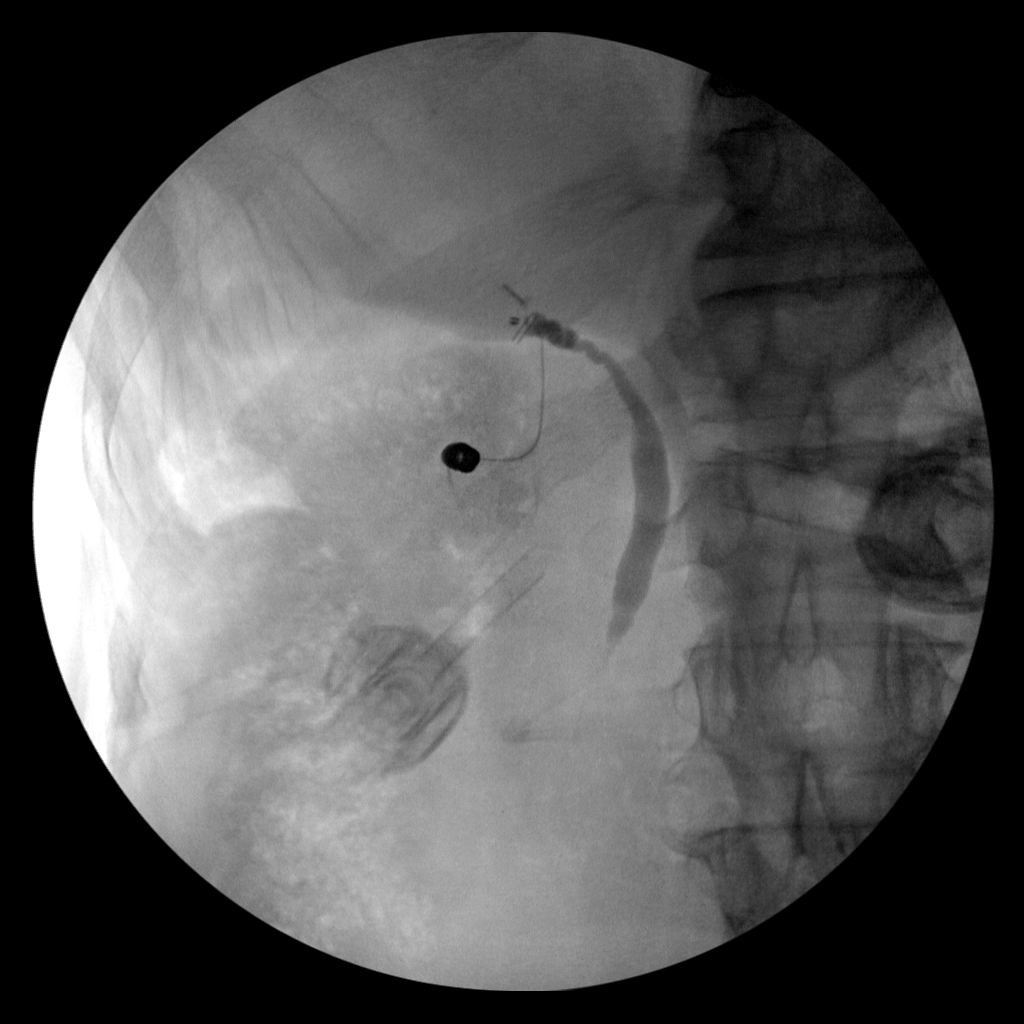
[im 1/2]
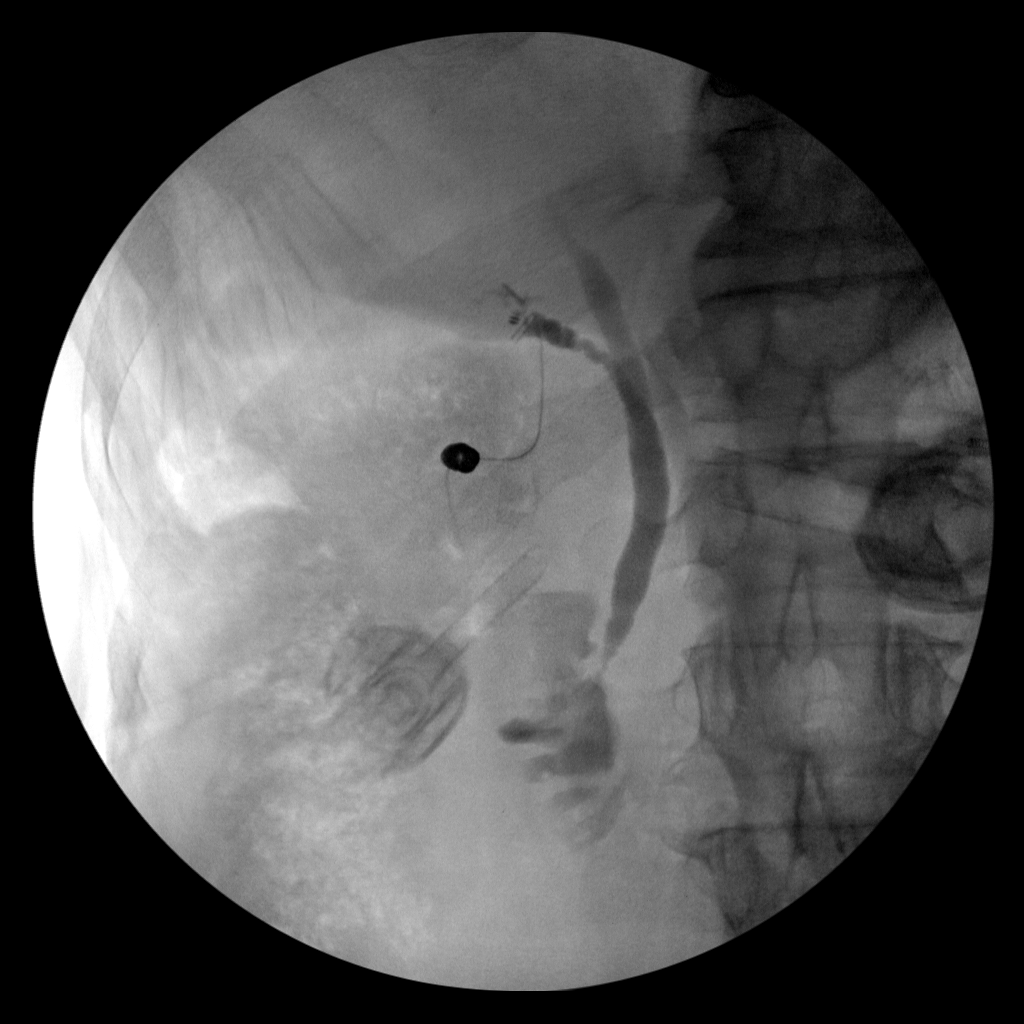
[im 1/2]
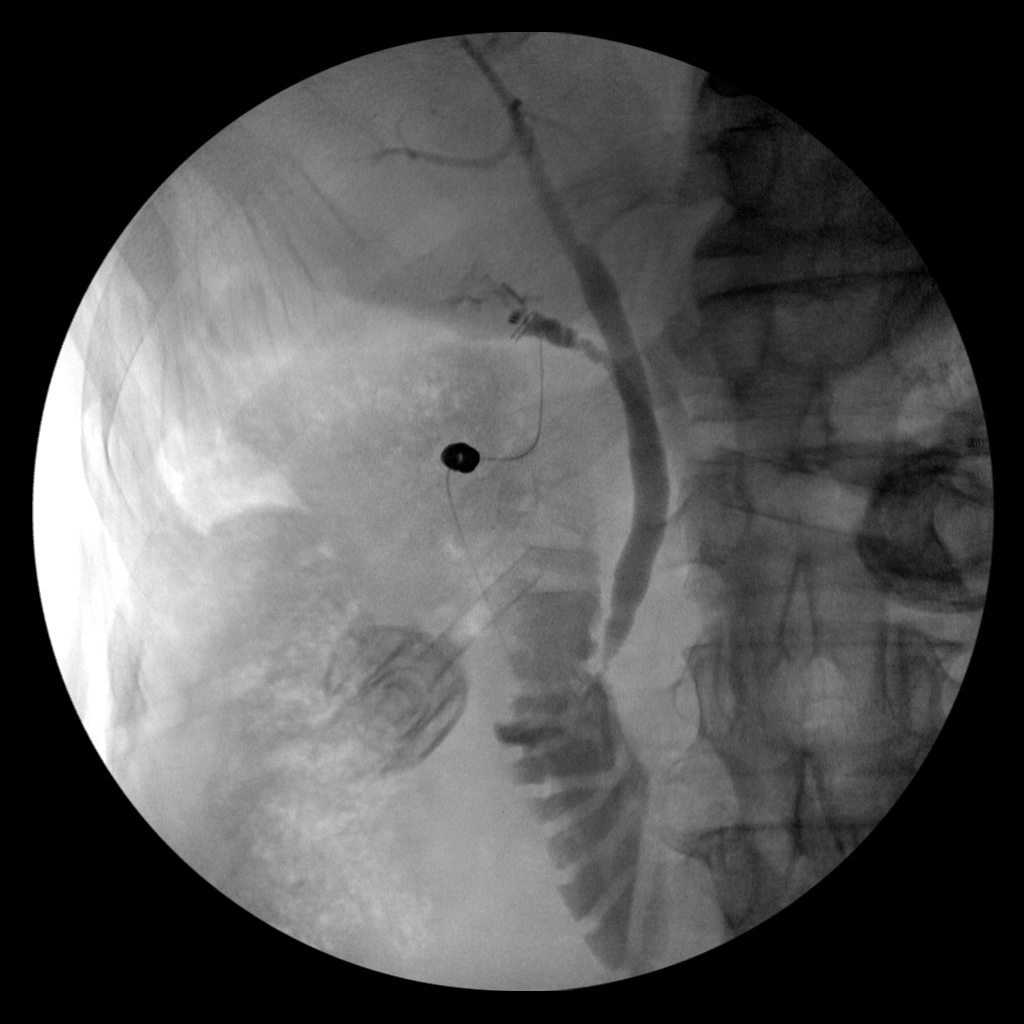
[im 1/2]
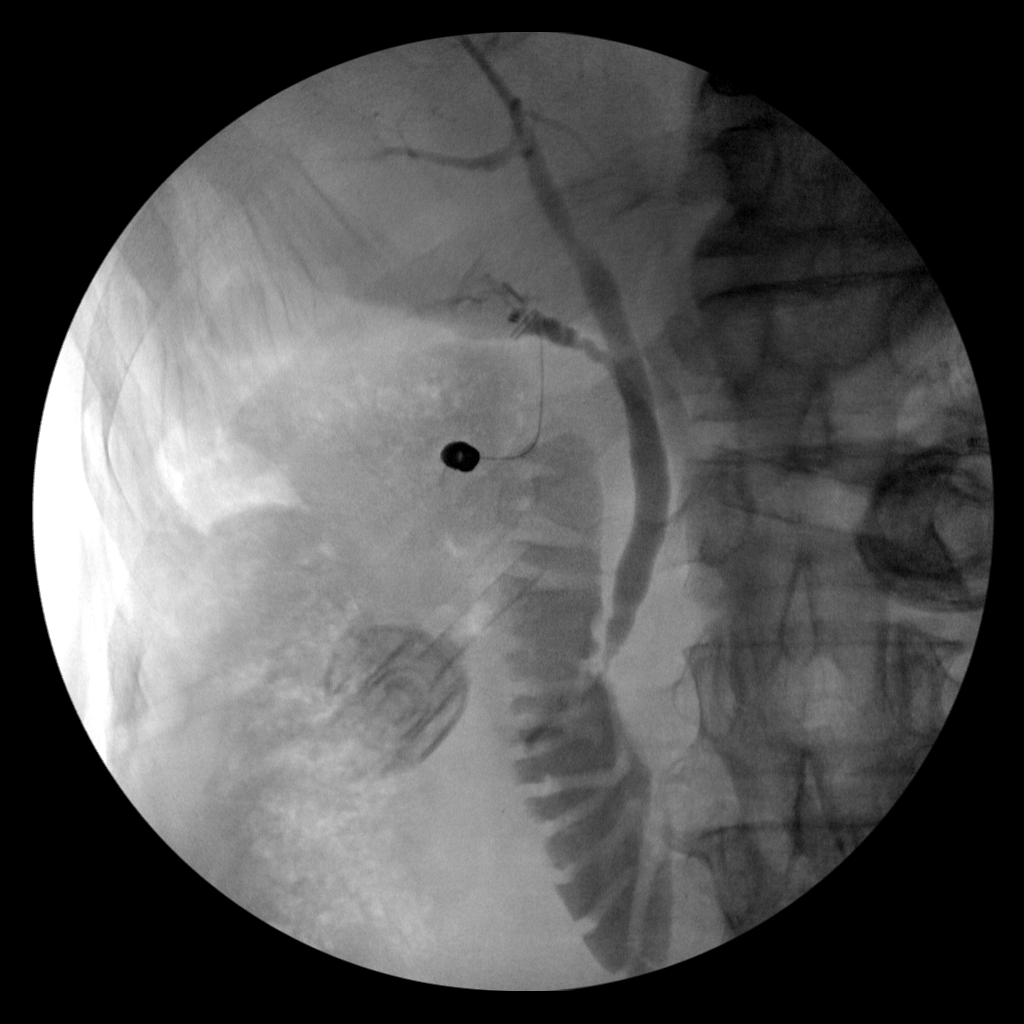
[im 2/2]
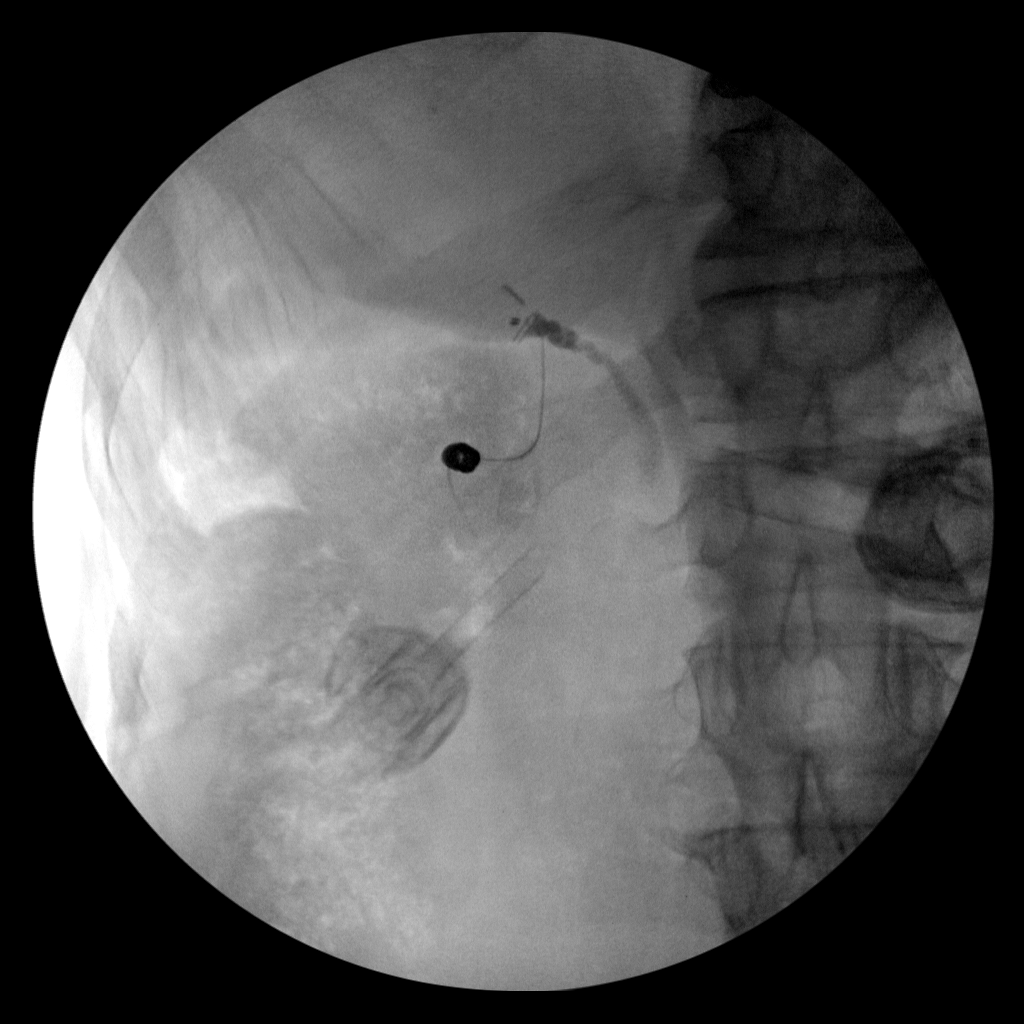

[5 of 5 positions shown; findings below may reference images not displayed]

FINDINGS: Surgical instruments project over the upper abdomen.

There is cannulation of the cystic duct/gallbladder neck, with
antegrade infusion of contrast. Caliber of the extrahepatic ductal
system within normal limits.

No definite filling defect within the extrahepatic ducts identified.

Free flow of contrast across the ampulla.
IMPRESSION: Intraoperative cholangiogram demonstrates extrahepatic biliary ducts
of unremarkable caliber, with no definite filling defects
identified. Free flow of contrast across the ampulla.

Please refer to the dictated operative report for full details of
intraoperative findings and procedure

## 2022-03-21 DIAGNOSIS — N529 Male erectile dysfunction, unspecified: Secondary | ICD-10-CM | POA: Diagnosis not present

## 2022-03-21 DIAGNOSIS — N401 Enlarged prostate with lower urinary tract symptoms: Secondary | ICD-10-CM | POA: Diagnosis not present

## 2022-03-21 DIAGNOSIS — N138 Other obstructive and reflux uropathy: Secondary | ICD-10-CM | POA: Diagnosis not present

## 2022-03-21 DIAGNOSIS — N281 Cyst of kidney, acquired: Secondary | ICD-10-CM | POA: Diagnosis not present

## 2022-03-21 DIAGNOSIS — N411 Chronic prostatitis: Secondary | ICD-10-CM | POA: Diagnosis not present

## 2022-04-19 ENCOUNTER — Telehealth: Payer: Self-pay | Admitting: Internal Medicine

## 2022-04-19 ENCOUNTER — Encounter (INDEPENDENT_AMBULATORY_CARE_PROVIDER_SITE_OTHER): Payer: Medicare Other | Admitting: Internal Medicine

## 2022-04-19 ENCOUNTER — Other Ambulatory Visit: Payer: Self-pay | Admitting: Internal Medicine

## 2022-04-19 DIAGNOSIS — J069 Acute upper respiratory infection, unspecified: Secondary | ICD-10-CM | POA: Diagnosis not present

## 2022-04-19 MED ORDER — HYDROCODONE BIT-HOMATROP MBR 5-1.5 MG/5ML PO SOLN: 5.0000 mL | Freq: Three times a day (TID) | ORAL | 0 refills | Status: AC | PRN

## 2022-04-19 NOTE — Telephone Encounter (Signed)
Please see the MyChart message reply(ies) for my assessment and plan.  The patient gave consent for this Medical Advice Message and is aware that it may result in a bill to their insurance company as well as the possibility that this may result in a co-payment or deductible. They are an established patient, but are not seeking medical advice exclusively about a problem treated during an in person or video visit in the last 7 days. I did not recommend an in person or video visit within 7 days of my reply.  I spent a total of 12 minutes cumulative time within 7 days through MyChart messaging Kaylei Frink, MD  

## 2022-04-19 NOTE — Telephone Encounter (Signed)
Pt requesting Dr. Ronnald Ramp call him in an RX for cough medicine, says he's has a cough for several days and it's keeping him from sleeping.   Has done 3 Covid tests, all have been negative.   Tried to convince pt to come in for a visit, which he declined.

## 2022-05-15 DIAGNOSIS — Z23 Encounter for immunization: Secondary | ICD-10-CM | POA: Diagnosis not present

## 2022-05-22 DIAGNOSIS — K743 Primary biliary cirrhosis: Secondary | ICD-10-CM | POA: Diagnosis not present

## 2022-06-08 ENCOUNTER — Other Ambulatory Visit: Payer: Self-pay | Admitting: Internal Medicine

## 2022-06-08 DIAGNOSIS — K743 Primary biliary cirrhosis: Secondary | ICD-10-CM

## 2022-07-03 ENCOUNTER — Other Ambulatory Visit: Payer: Self-pay | Admitting: Internal Medicine

## 2022-07-03 ENCOUNTER — Encounter: Payer: Self-pay | Admitting: Internal Medicine

## 2022-07-03 DIAGNOSIS — K743 Primary biliary cirrhosis: Secondary | ICD-10-CM

## 2022-07-03 MED ORDER — URSODIOL 500 MG PO TABS: 2000.0000 mg | ORAL_TABLET | Freq: Every day | ORAL | 1 refills | Status: DC

## 2022-07-03 MED ORDER — URSODIOL 250 MG PO TABS: 1000.0000 mg | ORAL_TABLET | Freq: Every day | ORAL | 1 refills | Status: DC

## 2022-07-31 DIAGNOSIS — Z23 Encounter for immunization: Secondary | ICD-10-CM | POA: Diagnosis not present

## 2022-08-03 ENCOUNTER — Other Ambulatory Visit: Payer: Self-pay | Admitting: Internal Medicine

## 2022-08-03 DIAGNOSIS — R4681 Obsessive-compulsive behavior: Secondary | ICD-10-CM

## 2022-08-03 DIAGNOSIS — E785 Hyperlipidemia, unspecified: Secondary | ICD-10-CM

## 2022-08-06 DIAGNOSIS — Z23 Encounter for immunization: Secondary | ICD-10-CM | POA: Diagnosis not present

## 2022-08-28 ENCOUNTER — Other Ambulatory Visit: Payer: Self-pay | Admitting: Internal Medicine

## 2022-08-28 DIAGNOSIS — E034 Atrophy of thyroid (acquired): Secondary | ICD-10-CM

## 2022-09-02 ENCOUNTER — Encounter: Payer: Self-pay | Admitting: Internal Medicine

## 2022-09-04 ENCOUNTER — Ambulatory Visit (INDEPENDENT_AMBULATORY_CARE_PROVIDER_SITE_OTHER): Payer: Medicare Other | Admitting: Internal Medicine

## 2022-09-04 ENCOUNTER — Encounter: Payer: Self-pay | Admitting: Internal Medicine

## 2022-09-04 VITALS — BP 122/76 | HR 77 | Temp 98.0°F | Ht 68.0 in | Wt 161.0 lb

## 2022-09-04 DIAGNOSIS — R7303 Prediabetes: Secondary | ICD-10-CM | POA: Diagnosis not present

## 2022-09-04 DIAGNOSIS — E034 Atrophy of thyroid (acquired): Secondary | ICD-10-CM | POA: Diagnosis not present

## 2022-09-04 DIAGNOSIS — N1831 Chronic kidney disease, stage 3a: Secondary | ICD-10-CM | POA: Diagnosis not present

## 2022-09-04 DIAGNOSIS — F5105 Insomnia due to other mental disorder: Secondary | ICD-10-CM

## 2022-09-04 DIAGNOSIS — F409 Phobic anxiety disorder, unspecified: Secondary | ICD-10-CM | POA: Diagnosis not present

## 2022-09-04 DIAGNOSIS — K743 Primary biliary cirrhosis: Secondary | ICD-10-CM | POA: Diagnosis not present

## 2022-09-04 DIAGNOSIS — H6122 Impacted cerumen, left ear: Secondary | ICD-10-CM | POA: Insufficient documentation

## 2022-09-04 MED ORDER — ZALEPLON 10 MG PO CAPS: 10.0000 mg | ORAL_CAPSULE | Freq: Every evening | ORAL | 1 refills | Status: DC | PRN

## 2022-09-04 NOTE — Patient Instructions (Signed)

## 2022-09-04 NOTE — Progress Notes (Signed)
Subjective:  Patient ID: William Bjornstad, MD, male    DOB: [birth year]  Age: 71 y.o. MRN: [id number]  CC: Hypothyroidism   HPI William Bjornstad, MD presents for f/up -  He complains of a several week history of discomfort in his left ear with decreased LOH.  Outpatient Medications Prior to Visit  Medication Sig Dispense Refill   atorvastatin (LIPITOR) 20 MG tablet TAKE 1 TABLET DAILY 90 tablet 0   cetirizine (ZYRTEC) 10 MG tablet Take 10 mg by mouth daily.     sertraline (ZOLOFT) 50 MG tablet TAKE 2 TABLETS(100MG TOTAL)DAILY 180 tablet 0   ursodiol (ACTIGALL) 250 MG tablet Take 4 tablets (1,000 mg total) by mouth daily. 360 tablet 1   levothyroxine (SYNTHROID) 112 MCG tablet Take 1 tablet (112 mcg total) by mouth daily. 90 tablet 0   zaleplon (SONATA) 10 MG capsule Take 1 capsule (10 mg total) by mouth at bedtime as needed for sleep. 50 capsule 1   No facility-administered medications prior to visit.    ROS Review of Systems  Constitutional:  Negative for diaphoresis, fatigue and fever.  HENT:  Positive for ear pain and hearing loss. Negative for sinus pressure.   Eyes: Negative.   Respiratory:  Negative for chest tightness, shortness of breath and wheezing.   Cardiovascular:  Negative for chest pain, palpitations and leg swelling.  Gastrointestinal:  Negative for abdominal pain, diarrhea, nausea and vomiting.  Endocrine: Negative.   Genitourinary: Negative.  Negative for difficulty urinating.  Musculoskeletal: Negative.   Hematological: Negative.  Negative for adenopathy. Does not bruise/bleed easily.  Psychiatric/Behavioral:  Positive for sleep disturbance. Negative for decreased concentration and dysphoric mood. The patient is not nervous/anxious.     Objective:  BP 122/76 (BP Location: Left Arm, Patient Position: Sitting, Cuff Size: Large)   Pulse 77   Temp 98 F (36.7 C) (Oral)   Ht '5\' 8"'  (1.727 m)   Wt 161 lb (73 kg)   SpO2 96%   BMI 24.48 kg/m   BP Readings from  Last 3 Encounters:  09/04/22 122/76  12/06/21 104/66  10/31/21 118/70    Wt Readings from Last 3 Encounters:  09/04/22 161 lb (73 kg)  12/06/21 164 lb (74.4 kg)  10/31/21 166 lb (75.3 kg)    Physical Exam Vitals reviewed.  HENT:     Right Ear: Tympanic membrane, ear canal and external ear normal. Decreased hearing noted. There is impacted cerumen.     Left Ear: Hearing, ear canal and external ear normal.     Ears:     Comments: I put Colace in his left ear and then irrigated it with water.  The cerumen was successfully removed.  His symptoms have resolved.  He tolerated this well.  The examination afterwards is normal.    Nose: Nose normal.     Mouth/Throat:     Mouth: Mucous membranes are moist.  Eyes:     General: No scleral icterus.    Conjunctiva/sclera: Conjunctivae normal.  Cardiovascular:     Rate and Rhythm: Normal rate and regular rhythm.     Heart sounds: No murmur heard. Pulmonary:     Effort: Pulmonary effort is normal.     Breath sounds: No stridor. No wheezing, rhonchi or rales.  Abdominal:     General: Abdomen is flat.     Palpations: There is no mass.     Tenderness: There is no abdominal tenderness. There is no guarding.     Hernia: No hernia  is present.  Musculoskeletal:        General: Normal range of motion.     Cervical back: Neck supple.     Right lower leg: No edema.     Left lower leg: No edema.  Lymphadenopathy:     Cervical: No cervical adenopathy.  Skin:    General: Skin is warm and dry.  Neurological:     General: No focal deficit present.     Mental Status: He is alert.  Psychiatric:        Mood and Affect: Mood normal.     Lab Results  Component Value Date   WBC 6.7 09/05/2022   HGB 15.1 09/05/2022   HCT 45.8 09/05/2022   PLT 205.0 09/05/2022   GLUCOSE 81 09/05/2022   CHOL 153 12/06/2021   TRIG 72.0 12/06/2021   HDL 46.00 12/06/2021   LDLDIRECT 134.5 12/15/2006   LDLCALC 93 12/06/2021   ALT 34 09/05/2022   AST 41 (H)  09/05/2022   NA 141 09/05/2022   K 4.1 09/05/2022   CL 102 09/05/2022   CREATININE 1.22 09/05/2022   BUN 21 09/05/2022   CO2 31 09/05/2022   TSH 1.95 09/05/2022   PSA 1.02 12/06/2021   INR 1.0 09/05/2022   HGBA1C 6.2 09/05/2022    DG Foot Complete Right  Result Date: 10/04/2021 CLINICAL DATA:  Puncture injury along the plantar foot yesterday EXAM: RIGHT FOOT COMPLETE - 3+ VIEW COMPARISON:  None. FINDINGS: No evidence of fracture or radiopaque foreign object. No abnormal finding. IMPRESSION: Negative radiographs.  No fracture or radiopaque foreign object. Electronically Signed   By: Nelson Chimes M.D.   On: 10/04/2021 11:54    Assessment & Plan:   Nethaniel was seen today for hypothyroidism.  Diagnoses and all orders for this visit:  Prediabetes- A1C is 6.2%. -     Basic metabolic panel; Future -     Hemoglobin A1c; Future -     CBC with Differential/Platelet; Future -     CBC with Differential/Platelet -     Hemoglobin A1c -     Basic metabolic panel  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 atrophy of thyroid- He is euthyroid. -     TSH; Future -     CBC with Differential/Platelet; Future -     CBC with Differential/Platelet -     TSH -     levothyroxine (SYNTHROID) 112 MCG tablet; Take 1 tablet (112 mcg total) by mouth daily.  Primary biliary cholangitis (HCC) -     Hepatic function panel; Future -     CBC with Differential/Platelet; Future -     Gamma GT; Future -     Protime-INR; Future -     Protime-INR -     Gamma GT -     CBC with Differential/Platelet -     Hepatic function panel  Hearing loss due to cerumen impaction, left  Insomnia due to anxiety and fear -     zaleplon (SONATA) 10 MG capsule; Take 1 capsule (10 mg total) by mouth at bedtime as needed for sleep.   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  Meds ordered this encounter  Medications   zaleplon (SONATA) 10 MG  capsule    Sig: Take 1 capsule (10 mg total) by mouth at bedtime as needed for sleep.    Dispense:  50 capsule    Refill:  1   levothyroxine (SYNTHROID) 112 MCG tablet  Sig: Take 1 tablet (112 mcg total) by mouth daily.    Dispense:  90 tablet    Refill:  1     Follow-up: Return in about 6 months (around 03/06/2023).  Scarlette Calico, MD

## 2022-09-04 NOTE — Progress Notes (Signed)
PRE-PROCEDURE EXAM: Left TM cannot be visualized due to total occlusion/impaction of the ear canal.  PROCEDURE INDICATION: remove wax to visualize ear drum & relieve discomfort  CONSENT:  Verbal  PROCEDURE NOTE:    LEFT EAR:  I used a metal wax curette under direct vision with an otoscope to free the wax bolus from the ear wall and then successfully removed a small bit of wax. The ear was then irrigated with warm water to remove the remaining wax.  POST- PROCEDURE EXAM: TMs successfully visualized and found to have no erythema     The patient tolerated the procedure well.

## 2022-09-05 DIAGNOSIS — N1831 Chronic kidney disease, stage 3a: Secondary | ICD-10-CM | POA: Insufficient documentation

## 2022-09-05 DIAGNOSIS — F409 Phobic anxiety disorder, unspecified: Secondary | ICD-10-CM | POA: Insufficient documentation

## 2022-09-05 LAB — GAMMA GT: GGT: 47 U/L (ref 7–51)

## 2022-09-05 LAB — CBC WITH DIFFERENTIAL/PLATELET
Basophils Absolute: 0 10*3/uL (ref 0.0–0.1)
Basophils Relative: 0.4 % (ref 0.0–3.0)
Eosinophils Absolute: 0.1 10*3/uL (ref 0.0–0.7)
Eosinophils Relative: 2.2 % (ref 0.0–5.0)
HCT: 45.8 % (ref 39.0–52.0)
Hemoglobin: 15.1 g/dL (ref 13.0–17.0)
Lymphocytes Relative: 27.1 % (ref 12.0–46.0)
Lymphs Abs: 1.8 10*3/uL (ref 0.7–4.0)
MCHC: 33 g/dL (ref 30.0–36.0)
MCV: 88 fl (ref 78.0–100.0)
Monocytes Absolute: 0.7 10*3/uL (ref 0.1–1.0)
Monocytes Relative: 10.6 % (ref 3.0–12.0)
Neutro Abs: 4 10*3/uL (ref 1.4–7.7)
Neutrophils Relative %: 59.7 % (ref 43.0–77.0)
Platelets: 205 10*3/uL (ref 150.0–400.0)
RBC: 5.2 Mil/uL (ref 4.22–5.81)
RDW: 14.1 % (ref 11.5–15.5)
WBC: 6.7 10*3/uL (ref 4.0–10.5)

## 2022-09-05 LAB — BASIC METABOLIC PANEL
BUN: 21 mg/dL (ref 6–23)
CO2: 31 mEq/L (ref 19–32)
Calcium: 10.4 mg/dL (ref 8.4–10.5)
Chloride: 102 mEq/L (ref 96–112)
Creatinine, Ser: 1.22 mg/dL (ref 0.40–1.50)
GFR: 58.81 mL/min — ABNORMAL LOW (ref >60.00)
Glucose, Bld: 81 mg/dL (ref 70–99)
Potassium: 4.1 mEq/L (ref 3.5–5.1)
Sodium: 141 mEq/L (ref 135–145)

## 2022-09-05 LAB — PROTIME-INR
INR: 1 ratio (ref 0.8–1.0)
Prothrombin Time: 11.4 s (ref 9.6–13.1)

## 2022-09-05 LAB — HEPATIC FUNCTION PANEL
ALT: 34 U/L (ref 0–53)
AST: 41 U/L — ABNORMAL HIGH (ref 0–37)
Albumin: 4.6 g/dL (ref 3.5–5.2)
Alkaline Phosphatase: 88 U/L (ref 39–117)
Bilirubin, Direct: 0.1 mg/dL (ref 0.0–0.3)
Total Bilirubin: 0.5 mg/dL (ref 0.2–1.2)
Total Protein: 8 g/dL (ref 6.0–8.3)

## 2022-09-05 LAB — TSH: TSH: 1.95 u[IU]/mL (ref 0.35–5.50)

## 2022-09-05 LAB — HEMOGLOBIN A1C: Hgb A1c MFr Bld: 6.2 % (ref 4.6–6.5)

## 2022-09-05 MED ORDER — LEVOTHYROXINE SODIUM 112 MCG PO TABS: 112.0000 ug | ORAL_TABLET | Freq: Every day | ORAL | 1 refills | Status: DC

## 2022-09-08 NOTE — Assessment & Plan Note (Signed)
Will avoid nephrotoxic agents.

## 2022-09-10 DIAGNOSIS — K148 Other diseases of tongue: Secondary | ICD-10-CM | POA: Diagnosis not present

## 2022-09-24 ENCOUNTER — Ambulatory Visit: Payer: Self-pay

## 2022-09-24 ENCOUNTER — Ambulatory Visit (INDEPENDENT_AMBULATORY_CARE_PROVIDER_SITE_OTHER): Payer: Medicare Other | Admitting: Sports Medicine

## 2022-09-24 VITALS — BP 118/82 | Ht 68.0 in | Wt 160.0 lb

## 2022-09-24 DIAGNOSIS — M25561 Pain in right knee: Secondary | ICD-10-CM

## 2022-09-24 NOTE — Assessment & Plan Note (Addendum)
He has clinical and radiographical evidence of medial meniscus tear with Baker's cyst and moderate effusion.  He likely sustained injury to his meniscus a couple of weeks ago.  Recommend he continue with ice and anti-inflammatories as needed.  He should also use compression sleeve that he has at home from prior knee injury.  Would recommend mini squats not to pass 90 degrees and physical therapy on a lower step careful not to engage patellofemoral compartment.  He can follow-up in a week or 2 prior to his ski vacation for corticosteroid injection.  We will hold off on steroid injection today as his pain is relatively well controlled and continues to improve.  He verbalized understanding.  Follow-up as needed

## 2022-09-24 NOTE — Patient Instructions (Signed)
  Single leg mini knee bends Lateral leg lifts Wall slides Stationary biking if available  Compression sleeve when active Findings: today degenerative meniscus with a fragment, bakers cyst and moderate effusion

## 2022-09-24 NOTE — Progress Notes (Signed)
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 MRN: [id number]  Right knee pain.  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  He denies any injury to the area.  He does note about 2 weeks ago he was doing some heavy lifting and climbing the stairs carrying some heavy bags and digging post holes which exacerbated his pain as well.  When he was having the worst of his pain about 3 to 4 weeks ago his pain was mainly located anterior and was also experiencing some fullness in the posterior aspect of his knee.  He denies any locking or buckling at that time.  He did have some swelling.  Since then he has had some improvement in his pain.  He is now able to ambulate up and down the stairs normally.  He is here today to follow-up on this knee pain as he would like to start preparing for ski season in January.  Of note he did have left knee meniscectomy a couple of years ago with Dr. Rhona Raider.  This followed some conservative treatment for degenerative meniscal changes  ROS as listed above in HPI/ no night pain/ no locking/ no giving way    Objective:     BP 118/82   Ht '5\' 8"'$  (1.727 m)   Wt 160 lb (72.6 kg)   BMI 24.33 kg/m   Physical Exam Vitals reviewed.  Constitutional:      General: He is not in acute distress.    Appearance: Normal appearance. He is normal weight. He is not ill-appearing, toxic-appearing or diaphoretic.  Neurological:     Mental Status: He is alert.   Right knee: No obvious deformity or asymmetry.  1+ effusion.  Slight tenderness to palpation of the anterior medial aspect of his knee.  He has full range of motion from 0 to about 130 degrees.  Strength 5/5 with resisted flexion and extension at the knee.  Stable to varus and valgus stress.  Negative Lachman's.  Negative McMurray's but appreciable guarding during the  exam.  Ultrasound of Knee-right  Patellar tendon -visualized, no abnormality Quad tendon -visualized, no abnormality seen Suprapatellar pouch effusion-moderate effusion seen Medial meniscus-extrusion and evidence of a split seen in the meniscus, in the anterior and mid meniscal evaluation. Relatively preserved joint space Lateral meniscus-visualized, no obvious abnormality seen Posterior knee: Moderate sized hypoechoic collection in the medial posterior knee.  No neovascularization of blood flow seen over that area.  Summary and additional findings-patient has relatively preserved joint space however there is evidence of degenerative meniscus tear, Baker's cyst and moderate knee effusion.  Ultrasound and interpretation by Dr. Arlis Porta and Wolfgang Phoenix. Fields, MD    Assessment & Plan:   Problem List Items Addressed This Visit       Other   Right knee pain - Primary    He has clinical and radiographical evidence of medial meniscus tear with Baker's cyst and moderate effusion.  He likely sustained injury to his meniscus a couple of weeks ago.  Recommend he continue with ice and anti-inflammatories as needed.  He should also use compression sleeve that he has at home from prior knee injury.  Would recommend mini squats not to pass 90 degrees and physical therapy on a lower step careful not to engage patellofemoral compartment.  He can follow-up in a  week or 2 prior to his ski vacation for corticosteroid injection.  We will hold off on steroid injection today as his pain is relatively well controlled and continues to improve.  He verbalized understanding.  Follow-up as needed      Relevant Orders   [place] LIMITED JOINT SPACE STRUCTURES LOW RIGHT    Return if symptoms worsen or fail to improve, for 1-2 weeks prior to ski vacation for [place] if desired .    Elmore Guise, DO  I observed and examined the patient with the [facility] resident and agree with assessment and plan.  Note reviewed and modified  by me. [name] Mcgill, MD

## 2022-11-01 ENCOUNTER — Other Ambulatory Visit: Payer: Self-pay | Admitting: Internal Medicine

## 2022-11-01 DIAGNOSIS — R4681 Obsessive-compulsive behavior: Secondary | ICD-10-CM

## 2022-11-01 DIAGNOSIS — E785 Hyperlipidemia, unspecified: Secondary | ICD-10-CM

## 2022-11-05 ENCOUNTER — Ambulatory Visit (INDEPENDENT_AMBULATORY_CARE_PROVIDER_SITE_OTHER): Payer: Medicare Other | Admitting: Family Medicine

## 2022-11-05 VITALS — Ht 68.0 in | Wt 160.0 lb

## 2022-11-05 DIAGNOSIS — Z0000 Encounter for general adult medical examination without abnormal findings: Secondary | ICD-10-CM | POA: Diagnosis not present

## 2022-11-05 NOTE — Patient Instructions (Addendum)
I really enjoyed getting to talk with you today!   CHECKLIST FROM ANNUAL WELLNESS VISIT:  -Follow up (please call to schedule if not scheduled after visit):  -yearly for annual wellness visit with primary care office  Here is a list of your preventive care/health maintenance measures:  Health Maintenance  Topic Date Due   COVID-19 Vaccine (7 - 2023-24 season) Completed 08/06/22   Medicare Annual Wellness (AWV)  11/01/2023   COLONOSCOPY (Pts 45-31yr Insurance coverage will need to be confirmed)  09/08/2023   DTaP/Tdap/Td (4 - Td or Tdap) 10/05/2031   Pneumonia Vaccine 71 Years old  Completed   INFLUENZA VACCINE  Completed   Hepatitis C Screening  Completed   Zoster Vaccines- Shingrix  Completed   HPV VACCINES  Aged Out    -See a dentist at least yearly  -Get your eyes checked and then per your eye specialist's recommendations  -Other issues addressed today:  -I have included below further information regarding a healthy whole foods based diet, physical activity guidelines for adults, stress management and opportunities for social connections. I hope you find this information useful.     NUTRITION: -eat real food: lots of colorful vegetables (half the plate) and fruits -5-7 servings of vegetables and fruits per day (fresh or steamed is best), for example: 2 servings of vegetables with lunch and dinner and 2 servings of fruit per day. Berries and greens such as kale and collards are great choices.  -consume on a regular basis: whole grains (make sure first ingredient on label contains the word "whole"), fresh fruits, fish, nuts, seeds, healthy oils (such as olive oil) -may eat small amounts of dairy and lean meat on occasion, but avoid  processed meats such as ham, bacon, lunch meat, etc. -drink water -try to avoid fast food and pre-packaged foods, processed meat -most experts advise limiting sodium to < '2300mg'$  per day, should limit further if any chronic conditions such as high blood pressure, heart disease, diabetes, etc.( < '1500mg'$  is is ideal) -try to avoid foods that contain any ingredients with names you do not recognize  -try to avoid sugar/sweets (except for the natural sugar that occurs in fresh fruit) -try to avoid sweet drinks -try to avoid white rice, white bread, pasta (unless whole grain), white or yellow potatoes  EXERCISE GUIDELINES FOR ADULTS: -if you wish to increase your physical activity, do so gradually and with the approval of your doctor -STOP and seek medical care immediately if you have any chest pain, chest discomfort or trouble breathing when starting or increasing exercise  -move and stretch your body, legs, feet and arms when sitting for long periods  -Physical activity guidelines for optimal health in adults: -least 150 minutes per week of aerobic exercise (can talk, but not sing) once approved by your doctor, 20-30 minutes of sustained activity at a time if able -resistance training (weights, machines, strengthening muscles) at least 2 days per week if approved by your doctor -balance exercises 3+ days per week:   Stand somewhere where you have something sturdy to hold onto if you lose balance.    1) lift up on toes, start with 5x per day and work up to 20x   2) stand and lift on leg straight out to the side so that foot is a few inches of the floor, start with 5x each side and work up to 20x each side   3) stand on one foot, start with 5 seconds each side and work up to  20 seconds on each side

## 2022-11-05 NOTE — Progress Notes (Signed)
PATIENT CHECK-IN and HEALTH RISK ASSESSMENT QUESTIONNAIRE:  -completed by phone/video for upcoming Medicare Preventive Visit  Pre-Visit Check-in: 1)Vitals (height, wt, BP, etc) - record in vitals section for visit on day of visit 2)Review and Update Medications, Allergies PMH, Surgeries, Social history in Epic 3)Hospitalizations in the last year with date/reason? No  4)Review and Update Care Team (patient's specialists) in Epic 5) Complete PHQ9 in Epic  6) Complete Fall Screening in Epic 7)Review all Health Maintenance Due and order under PCP if not done.  Medicare Wellness Patient Questionnaire:  Answer theses question about your habits: Do you drink alcohol? Yes If yes, how many drinks do you have a day?2 beers a wk, not more than one per day Have you ever smoked?no Quit date if applicable? N/a    How many packs a day do/did you smoke? N/A Do you use smokeless tobacco?no Do you use an illicit drugs?no Do you exercises? Yes IF so, what type and how many days/minutes per week? 5 days week for 30 minutes.  Are you sexually active? No Number of partners? Typical breakfast: Cereal with fruits and coffee Typical lunch: no lunch Typical dinner: fish to chicken, pasta, vegetables Typical snacks: cookies, pretzel  Beverages: water, skim milk, crystal lite lemonade  Answer theses question about you: Can you perform most household chores?Yes Do you find it hard to follow a conversation in a noisy room?no Do you often ask people to speak up or repeat themselves?sometimes, decrease hearing on left ear. Seeing audiology - has appt in January. Do you feel that you have a problem with memory?no Do you balance your checkbook and or bank acounts?yes Do you feel safe at home?yes Last dentist visit?3 months ago Do you need assistance with any of the following: Please note if so No  Driving?  Feeding yourself?  Getting from bed to chair?  Getting to the toilet?  Bathing or  showering?  Dressing yourself?  Managing money?  Climbing a flight of stairs  Preparing meals?    Do you have Advanced Directives in place (Living Will, Healthcare Power or Attorney)? Yes   Last eye Exam and location? More than a year ago with Macarthur Critchley on [facility].    Do you currently use prescribed or non-prescribed narcotic or opioid pain medications?No  Do you have a history or close family history of breast, ovarian, tubal or peritoneal cancer or a family member with BRCA (breast cancer susceptibility 1 and 2) gene mutations?  colon cancer history in the family.   Nurse/Assistant Credentials/time stamp: Karpuih M./[name]/11:06am   ----------------------------------------------------------------------------------------------------------------------------------------------------------------------------------------------------------------------    MEDICARE ANNUAL PREVENTIVE CARE VISIT WITH PROVIDER (Welcome to Medicare, initial annual wellness or annual wellness exam)  Virtual Visit via Video Note  I connected with William Luna  on 11/05/2022 by a video enabled telemedicine application and verified that I am speaking with the correct person using two identifiers.  Location patient: home Location provider:work or home office Persons participating in the virtual visit: patient, provider  Concerns and/or follow up today: reports nothing new other than recovering from meniscus issues - seeing sports med.    See HM section in Epic for other details of completed HM.    ROS: negative for report of fevers, unintentional weight loss,  chest pain, sob, hemoptysis, melena, hematochezia, hematuria, falls, bleeding or bruising, thoughts of suicide or self harm, memory loss  Patient-completed extensive health risk assessment - reviewed and discussed with the patient: See Health Risk Assessment completed with patient prior to the  visit either above or in recent phone note. This was  reviewed in detailed with the patient today and appropriate recommendations, orders and referrals were placed as needed per Summary below and patient instructions.   Review of Medical History: -PMH, Dolores, Family History and current specialty and care providers reviewed and updated and listed below   Patient Care Team: Janith Lima, MD as PCP - General (Internal Medicine) Lavonna Monarch, MD (Inactive) (Dermatology) Nobie Putnam, MD (Hematology and Oncology) Kristeen Miss, MD (Neurosurgery) Melissa Montane, MD (Otolaryngology) Vicie Mutters, MD Charlton Haws, [facility] as Pharmacist (Pharmacist) Macarthur Critchley, [place] as Referring Physician (Optometry) Ian Bushman, MD as Referring Physician (Transplant Hepatology) Gatha Mayer, MD as Consulting Physician (Gastroenterology) Melrose Nakayama, MD as Consulting Physician (Orthopedic Surgery)   Past Medical History:  Diagnosis Date   Adenomatous colon polyp    2003   ANEMIA, IRON DEFICIENCY    mild in the past   BPH (benign prostatic hyperplasia)    CERVICAL RADICULOPATHY    disectomy in 1990s   DDD (degenerative disc disease), lumbar oct '12   L4-5, ESI therapy successful 10/12; L5-S1 radiculopathy 3/13   Fundic gland polyps of stomach, benign    GERD    EGD negative x 3, off medication (July '13)   Hearing loss in left ear    HYPERLIPIDEMIA, MILD    lipitor 10m    Hypothyroidism    Liver cyst    Primary biliary cholangitis ([facility] 09/18/2018   Renal cyst    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[place])   mid-tongue squamous cell - excised, clean margins, 11 nodes negative, no adjuvant therapy   TRANSAMINASES, SERUM, ELEVATED     Past Surgical History:  Procedure Laterality Date   BACK SURGERY  '90s ([place])   CERVICAL DISC - diskectomy w/ fusion, anterior   CHOLECYSTECTOMY N/A 01/05/2021   Procedure: LAPAROSCOPIC CHOLECYSTECTOMY WITH INTRAOPERATIVE CHOLANGIOGRAM;  Surgeon: MJohnathan Hausen MD;  Location: [facility];  Service:  General;  Laterality: N/A;   COLONOSCOPY     Multiple   ESOPHAGOGASTRODUODENOSCOPY     Multiple   HEMIGLOSSECTOMY  10/30/2011   Procedure: HEMIGLOSSECTOMY;  Surgeon: JCecil Cranker  Location: [facility];  Service: ENT;  Laterality: Right;  Right tongue resection   LUMBAR LAMINECTOMY/DECOMPRESSION MICRODISCECTOMY  01/29/2012   Procedure: LUMBAR LAMINECTOMY/DECOMPRESSION MICRODISCECTOMY;  Surgeon: HKristeen Miss MD;  [facility];  Service: Neurosurgery;  Laterality: Left;  Left L5-S1 Microdiskectomy   LUMBAR LAMINECTOMY/DECOMPRESSION MICRODISCECTOMY  07/13/2012   Procedure: LUMBAR LAMINECTOMY/DECOMPRESSION MICRODISCECTOMY 1 LEVEL;  Surgeon: HKristeen Miss MD;  [facility];  Service: Neurosurgery;  Laterality: Left;  Redo Left Lumbar five-sacral one Microdiskectomy   nail avulsion     right first finger due to a wart   RADICAL NECK DISSECTION  10/30/2011   Procedure: RADICAL NECK DISSECTION;  Surgeon: JCecil Cranker  Location: [facility];  Service: ENT;  Laterality: Right;    Social History   Socioeconomic History   Marital status: Married    Spouse name: [name]   Number of children: 2   Years of education: 27   Highest education level: Not on file  Occupational History   Occupation: CSecretary/administrator [place]  Tobacco Use   Smoking status: Never   Smokeless tobacco: Never  Vaping Use   Vaping Use: Never used  Substance and Sexual Activity   Alcohol use: Yes    Alcohol/week: 0.0 standard drinks of alcohol  Comment: OCC.   Drug use: No   Sexual activity: Yes    Partners: Female    Birth control/protection: None  Other Topics Concern   Not on file  Social History Narrative   [employer] [ [place];; scholar}; [place],   [employer] residency/fellowship.    Married- 1977 - 25 years/divorced;  Married 2006.    1 son  b1980, 1 daughter - [phone]; Pearson Grippe [phone],  Step-daughter [phone].    Grandchildren - 2 (b'2010, 2013).    Work - Oncologist.  Retired 2016   ACP/Living will:  Wife  with healthcare POA; Yes - CPR, Yes - short-term mechanical ventilation,  no futile care.   Patient no alcohol no tobacco or drug use   Social Determinants of Health   Financial Resource Strain: Low Risk  (10/31/2021)   Overall Financial Resource Strain (CARDIA)    Difficulty of Paying Living Expenses: Not hard at all  Food Insecurity: No Food Insecurity (10/31/2021)   Hunger Vital Sign    Worried About Running Out of Food in the Last Year: Never true    Ran Out of Food in the Last Year: Never true  Transportation Needs: No Transportation Needs (10/31/2021)   PRAPARE - Hydrologist (Medical): No    Lack of Transportation (Non-Medical): No  Physical Activity: Sufficiently Active (10/31/2021)   Exercise Vital Sign    Days of Exercise per Week: 5 days    Minutes of Exercise per Session: 30 min  Stress: No Stress Concern Present (10/31/2021)   [place]    Feeling of Stress : Not at all  Social Connections: [place] (10/31/2021)   Social Connection and Isolation Panel [NHANES]    Frequency of Communication with Friends and Family: More than three times a week    Frequency of Social Gatherings with Friends and Family: More than three times a week    Attends Religious Services: More than 4 times per year    Active Member of [employer] or Organizations: Not on file    Attends Archivist Meetings: More than 4 times per year    Marital Status: Married  Human resources officer Violence: Not At Risk (10/31/2021)   Humiliation, Afraid, Rape, and Kick questionnaire    Fear of Current or Ex-Partner: No    Emotionally Abused: No    Physically Abused: No    Sexually Abused: No    Family History  Problem Relation Age of Onset   Diabetes Mother    Anemia Mother    Cirrhosis Mother 78       primary biliary cirrhosis   Hyperlipidemia Father     Hypertension Father    Heart disease Father    Colon cancer Maternal Grandfather    COPD Neg Hx     Current Outpatient Medications on File Prior to Visit  Medication Sig Dispense Refill   atorvastatin (LIPITOR) 20 MG tablet TAKE 1 TABLET DAILY 90 tablet 0   cetirizine (ZYRTEC) 10 MG tablet Take 10 mg by mouth daily.     levothyroxine (SYNTHROID) 112 MCG tablet Take 1 tablet (112 mcg total) by mouth daily. 90 tablet 1   sertraline (ZOLOFT) 50 MG tablet TAKE 2 TABLETS (100MG      TOTAL) DAILY 180 tablet 0   ursodiol (ACTIGALL) 250 MG tablet Take 4 tablets (1,000 mg total) by mouth daily. 360 tablet 1   zaleplon (SONATA) 10 MG capsule Take 1  capsule (10 mg total) by mouth at bedtime as needed for sleep. 50 capsule 1   No current facility-administered medications on file prior to visit.    No Known Allergies     Physical Exam There were no vitals filed for this visit. Estimated body mass index is 24.33 kg/m as calculated from the following:   Height as of this encounter: _0  (1.727 m).   Weight as of this encounter: 160 lb (72.6 kg).  EKG (optional): deferred due to virtual visit  GENERAL: alert, oriented, appears well and in no acute distress; visual acuity grossly intact, full vision exam deferred due to pandemic and/or virtual encounter  HEENT: atraumatic, conjunttiva clear, no obvious abnormalities on inspection of external nose and ears  NECK: normal movements of the head and neck  LUNGS: on inspection no signs of respiratory distress, breathing rate appears normal, no obvious gross SOB, gasping or wheezing  CV: no obvious cyanosis  MS: moves all visible extremities without noticeable abnormality  PSYCH/NEURO: pleasant and cooperative, no obvious depression or anxiety, speech and thought processing grossly intact, Cognitive function grossly intact  [place] Office Visit from 07/19/2019 in [facility]  PHQ-9 Total Score 0            11/05/2022   10:57 AM 10/31/2021   11:47 AM 10/04/2021   11:12 AM 07/20/2020    8:15 AM 07/19/2019    2:03 PM  Depression screen PHQ 2/9  Decreased Interest 0 0 0 0 0  Down, Depressed, Hopeless 0 0 0 0 0  PHQ - 2 Score 0 0 0 0 0  Altered sleeping     0  Tired, decreased energy     0  Change in appetite     0  Feeling bad or failure about yourself      0  Trouble concentrating     0  Moving slowly or fidgety/restless     0  Suicidal thoughts     0  PHQ-9 Score     0  Difficult doing work/chores     Not difficult at all       07/20/2020    8:15 AM 09/06/2020    9:20 AM 10/04/2021   11:12 AM 10/31/2022   11:42 AM 11/05/2022   10:57 AM  Fall Risk  Falls in the past year? 0 0 0 0 0  Was there an injury with Fall?  0 0 0 0  Fall Risk Category Calculator  0 0 0 0  Fall Risk Category  Low Low Low Low  Patient Fall Risk Level  Low fall risk Low fall risk  Low fall risk  Patient at Risk for Falls Due to  No Fall Risks No Fall Risks  No Fall Risks  Fall risk Follow up  Falls evaluation completed Falls evaluation completed  Falls evaluation completed     SUMMARY AND PLAN:  Medicare annual wellness visit, subsequent   Discussed applicable health maintenance/preventive health measures and advised and referred or ordered per patient preferences:  Health Maintenance  Topic Date Due   COVID-19 Vaccine (7 - 2023-24 season) 08/07/2023    COLONOSCOPY (Pts 45-57yr Insurance coverage will need to be confirmed)  09/08/2023   Medicare Annual Wellness (AWV)  11/06/2023   DTaP/Tdap/Td (4 - Td or Tdap) 10/05/2031   Pneumonia Vaccine 71 Years old  Completed   INFLUENZA VACCINE  Completed   Hepatitis C Screening  Completed   Zoster Vaccines- Shingrix  Completed  HPV VACCINES  Aged [employer] and counseling on the following was provided based on the above review of health and a plan/checklist for the patient, along with additional information discussed, was provided for the patient  in the patient instructions :    -Advised and counseled on maintaining healthy weight and healthy lifestyle - discussed increasing fresh/whole fruits and veggies to 5-7 servings per day and adding balance exercises a few days per week -Advise yearly dental visits at minimum and regular eye exams  Follow up: see patient instructions   Patient Instructions  I really enjoyed getting to talk with you today!   CHECKLIST FROM ANNUAL WELLNESS VISIT:  -Follow up (please call to schedule if not scheduled after visit):  -yearly for annual wellness visit with primary care office  Here is a list of your preventive care/health maintenance measures:  Health Maintenance  Topic Date Due   COVID-19 Vaccine (7 - 2023-24 season) Completed 08/06/22   Medicare Annual Wellness (AWV)  11/01/2023   COLONOSCOPY (Pts 45-46yr Insurance coverage will need to be confirmed)  09/08/2023   DTaP/Tdap/Td (4 - Td or Tdap) 10/05/2031   Pneumonia Vaccine 71 Years old  Completed   INFLUENZA VACCINE  Completed   Hepatitis C Screening  Completed   Zoster Vaccines- Shingrix  Completed   HPV VACCINES  Aged Out    -See a dentist at least yearly  -Get your eyes checked and then per your eye specialist's recommendations  -Other issues addressed today:  -I have included below further information regarding a healthy whole foods based diet, physical activity guidelines for adults, stress management and opportunities for social connections. I hope you find this information useful.   -----------------------------------------------------------------------------------------------------------------------------------------------------------------------------------------------------------------------------------------------------------  NUTRITION: -eat real food: lots of colorful vegetables (half the plate) and fruits -5-7 servings of vegetables and fruits per day (fresh or steamed is best), for example: 2 servings of  vegetables with lunch and dinner and 2 servings of fruit per day. Berries and greens such as kale and collards are great choices.  -consume on a regular basis: whole grains (make sure first ingredient on label contains the word "whole"), fresh fruits, fish, nuts, seeds, healthy oils (such as olive oil) -may eat small amounts of dairy and lean meat on occasion, but avoid processed meats such as ham, bacon, lunch meat, etc. -drink water -try to avoid fast food and pre-packaged foods, processed meat -most experts advise limiting sodium to < 23031mper day, should limit further if any chronic conditions such as high blood pressure, heart disease, diabetes, etc.( < 150019ms is ideal) -try to avoid foods that contain any ingredients with names you do not recognize  -try to avoid sugar/sweets (except for the natural sugar that occurs in fresh fruit) -try to avoid sweet drinks -try to avoid white rice, white bread, pasta (unless whole grain), white or yellow potatoes  EXERCISE GUIDELINES FOR ADULTS: -if you wish to increase your physical activity, do so gradually and with the approval of your doctor -STOP and seek medical care immediately if you have any chest pain, chest discomfort or trouble breathing when starting or increasing exercise  -move and stretch your body, legs, feet and arms when sitting for long periods  -Physical activity guidelines for optimal health in adults: -least 150 minutes per week of aerobic exercise (can talk, but not sing) once approved by your doctor, 20-30 minutes of sustained activity at a time if able -resistance training (weights, machines, strengthening muscles) at least 2  days per week if approved by your doctor -balance exercises 3+ days per week:   Stand somewhere where you have something sturdy to hold onto if you lose balance.    1) lift up on toes, start with 5x per day and work up to 20x   2) stand and lift on leg straight out to the side so that foot is a few  inches of the floor, start with 5x each side and work up to 20x each side   3) stand on one foot, start with 5 seconds each side and work up to 20 seconds on each side              Lucretia Kern, DO

## 2022-11-07 ENCOUNTER — Ambulatory Visit (INDEPENDENT_AMBULATORY_CARE_PROVIDER_SITE_OTHER): Payer: Medicare Other | Admitting: Sports Medicine

## 2022-11-07 VITALS — BP 128/80 | Ht 68.0 in | Wt 160.0 lb

## 2022-11-07 DIAGNOSIS — G8929 Other chronic pain: Secondary | ICD-10-CM | POA: Diagnosis not present

## 2022-11-07 DIAGNOSIS — M25561 Pain in right knee: Secondary | ICD-10-CM

## 2022-11-07 NOTE — Progress Notes (Signed)
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 that led to some swelling and a small Baker's cyst on his last evaluation He has been doing good conservative care with riding a bicycle at home He has been cautious with loading his right knee Now he has some intermittent pain but not consistently and he has days with no pain at all Occasionally he will have pain at night He has not seen significant swelling He is walking up steps cautiously  Review of systems No locking No giving way  Physical exam Pleasant white male in no acute distress BP 128/80   Ht '5\' 8"'$  (1.727 m)   Wt 160 lb (72.6 kg)   BMI 24.33 kg/m   Knee: Right Normal to inspection with no erythema or effusion or obvious bony abnormalities. Palpation normal with no warmth or joint line tenderness or patellar tenderness or condyle tenderness. ROM normal in flexion and extension and lower leg rotation. Ligaments with solid consistent endpoints including ACL, PCL, LCL, MCL. Negative Mcmurray's and provocative meniscal tests. Non painful patellar compression. Patellar and quadriceps tendons unremarkable. Hamstring and quadriceps strength is normal.  He had excellent range of motion and no pain with greater than 140 degrees of knee flexion Not detect any effusion or Baker's cyst like I saw last visit There is some minimal medial joint line sensitivity  Minor addendum Some right anterior shoulder pain that may be related to his AC joint or biceps tendon. Exam hurts on crossover test. He will try some biceps exercises and we will recheck with scan in the future if still painful

## 2022-11-07 NOTE — Assessment & Plan Note (Signed)
Today as a follow-up of his degenerative meniscus changes  He seems to have improved significantly  With that in mind I do not plan to do an injection today  He is cleared to try skiing and other activities  If he worsens with these I would like to reevaluate him and would do additional ultrasound

## 2022-12-08 ENCOUNTER — Encounter: Payer: Self-pay | Admitting: Internal Medicine

## 2022-12-09 ENCOUNTER — Other Ambulatory Visit: Payer: Self-pay | Admitting: Internal Medicine

## 2022-12-09 DIAGNOSIS — R4681 Obsessive-compulsive behavior: Secondary | ICD-10-CM

## 2022-12-09 DIAGNOSIS — E785 Hyperlipidemia, unspecified: Secondary | ICD-10-CM

## 2022-12-09 DIAGNOSIS — E034 Atrophy of thyroid (acquired): Secondary | ICD-10-CM

## 2022-12-09 MED ORDER — LEVOTHYROXINE SODIUM 112 MCG PO TABS: 112.0000 ug | ORAL_TABLET | Freq: Every day | ORAL | 0 refills | Status: DC

## 2022-12-09 MED ORDER — SERTRALINE HCL 50 MG PO TABS: 50.0000 mg | ORAL_TABLET | Freq: Two times a day (BID) | ORAL | 0 refills | Status: DC

## 2022-12-09 MED ORDER — ATORVASTATIN CALCIUM 20 MG PO TABS: 20.0000 mg | ORAL_TABLET | Freq: Every day | ORAL | 0 refills | Status: DC

## 2022-12-11 DIAGNOSIS — H903 Sensorineural hearing loss, bilateral: Secondary | ICD-10-CM | POA: Diagnosis not present

## 2022-12-12 ENCOUNTER — Encounter: Payer: Self-pay | Admitting: Internal Medicine

## 2022-12-12 ENCOUNTER — Ambulatory Visit (INDEPENDENT_AMBULATORY_CARE_PROVIDER_SITE_OTHER): Payer: Medicare Other | Admitting: Internal Medicine

## 2022-12-12 VITALS — BP 126/82 | HR 73 | Temp 98.1°F | Ht 68.0 in | Wt 165.0 lb

## 2022-12-12 DIAGNOSIS — N1831 Chronic kidney disease, stage 3a: Secondary | ICD-10-CM

## 2022-12-12 DIAGNOSIS — R7303 Prediabetes: Secondary | ICD-10-CM | POA: Diagnosis not present

## 2022-12-12 DIAGNOSIS — N182 Chronic kidney disease, stage 2 (mild): Secondary | ICD-10-CM | POA: Insufficient documentation

## 2022-12-12 DIAGNOSIS — K743 Primary biliary cirrhosis: Secondary | ICD-10-CM

## 2022-12-12 LAB — HEPATIC FUNCTION PANEL
ALT: 31 U/L (ref 0–53)
AST: 34 U/L (ref 0–37)
Albumin: 4.6 g/dL (ref 3.5–5.2)
Alkaline Phosphatase: 89 U/L (ref 39–117)
Bilirubin, Direct: 0.1 mg/dL (ref 0.0–0.3)
Total Bilirubin: 0.8 mg/dL (ref 0.2–1.2)
Total Protein: 8.1 g/dL (ref 6.0–8.3)

## 2022-12-12 LAB — URINALYSIS, ROUTINE W REFLEX MICROSCOPIC
Bilirubin Urine: NEGATIVE
Hgb urine dipstick: NEGATIVE
Ketones, ur: NEGATIVE
Leukocytes,Ua: NEGATIVE
Nitrite: NEGATIVE
RBC / HPF: NONE SEEN (ref 0–?)
Specific Gravity, Urine: <=1.005 — AB (ref 1.000–1.030)
Total Protein, Urine: NEGATIVE
Urine Glucose: NEGATIVE
Urobilinogen, UA: 0.2 (ref 0.0–1.0)
pH: 7 (ref 5.0–8.0)

## 2022-12-12 LAB — BASIC METABOLIC PANEL
BUN: 16 mg/dL (ref 6–23)
CO2: 31 mEq/L (ref 19–32)
Calcium: 10 mg/dL (ref 8.4–10.5)
Chloride: 99 mEq/L (ref 96–112)
Creatinine, Ser: 0.9 mg/dL (ref 0.40–1.50)
GFR: 84.56 mL/min (ref >60.00)
Glucose, Bld: 104 mg/dL — ABNORMAL HIGH (ref 70–99)
Potassium: 4 mEq/L (ref 3.5–5.1)
Sodium: 137 mEq/L (ref 135–145)

## 2022-12-12 NOTE — Progress Notes (Signed)
Subjective:  Patient ID: William Bjornstad, MD, male    DOB: 01-May-1951  Age: 72 y.o. MRN: [id number]  CC: Hypothyroidism   HPI William Bjornstad, MD presents for f/up -  He is active and denies chest pain, shortness of breath, diaphoresis, or edema.  He has chronic knee pain but rarely takes NSAIDs.  Outpatient Medications Prior to Visit  Medication Sig Dispense Refill   atorvastatin (LIPITOR) 20 MG tablet Take 1 tablet (20 mg total) by mouth daily. 90 tablet 0   cetirizine (ZYRTEC) 10 MG tablet Take 10 mg by mouth daily.     levothyroxine (SYNTHROID) 112 MCG tablet Take 1 tablet (112 mcg total) by mouth daily. 90 tablet 0   sertraline (ZOLOFT) 50 MG tablet Take 1 tablet (50 mg total) by mouth 2 (two) times daily. 180 tablet 0   ursodiol (ACTIGALL) 250 MG tablet Take 4 tablets (1,000 mg total) by mouth daily. 360 tablet 1   zaleplon (SONATA) 10 MG capsule Take 1 capsule (10 mg total) by mouth at bedtime as needed for sleep. 50 capsule 1   No facility-administered medications prior to visit.    ROS Review of Systems  Constitutional:  Positive for unexpected weight change (wt gain). Negative for diaphoresis and fatigue.  HENT: Negative.    Eyes: Negative.   Respiratory:  Negative for cough, chest tightness, shortness of breath and wheezing.   Cardiovascular:  Negative for chest pain, palpitations and leg swelling.  Gastrointestinal:  Negative for abdominal pain, constipation, diarrhea, nausea and vomiting.  Endocrine: Negative.   Genitourinary: Negative.  Negative for decreased urine volume, difficulty urinating, dysuria, hematuria and urgency.  Musculoskeletal:  Positive for arthralgias. Negative for myalgias.  Skin: Negative.   Neurological:  Negative for dizziness, weakness and light-headedness.  Hematological:  Negative for adenopathy. Does not bruise/bleed easily.  Psychiatric/Behavioral: Negative.      Objective:  BP 126/82 (BP Location: Left Arm, Patient Position: Sitting,  Cuff Size: Large)   Pulse 73   Temp 98.1 F (36.7 C) (Oral)   Ht '5\' 8"'$  (1.727 m)   Wt 165 lb (74.8 kg)   SpO2 96%   BMI 25.09 kg/m   BP Readings from Last 3 Encounters:  12/12/22 126/82  11/07/22 128/80  09/24/22 118/82    Wt Readings from Last 3 Encounters:  12/12/22 165 lb (74.8 kg)  11/07/22 160 lb (72.6 kg)  11/05/22 160 lb (72.6 kg)    Physical Exam Vitals reviewed.  HENT:     Nose: Nose normal.     Mouth/Throat:     Mouth: Mucous membranes are moist.  Eyes:     General: No scleral icterus.    Conjunctiva/sclera: Conjunctivae normal.  Cardiovascular:     Rate and Rhythm: Normal rate and regular rhythm.     Heart sounds: No murmur heard. Pulmonary:     Effort: Pulmonary effort is normal.     Breath sounds: No stridor. No wheezing, rhonchi or rales.  Abdominal:     General: Abdomen is flat.     Palpations: There is no mass.     Tenderness: There is no abdominal tenderness. There is no guarding.     Hernia: No hernia is present.  Musculoskeletal:        General: Normal range of motion.     Cervical back: Neck supple.     Right lower leg: No edema.     Left lower leg: No edema.  Lymphadenopathy:     Cervical: No cervical  adenopathy.  Skin:    General: Skin is warm and dry.  Neurological:     General: No focal deficit present.     Mental Status: He is alert.  Psychiatric:        Mood and Affect: Mood normal.        Behavior: Behavior normal.     Lab Results  Component Value Date   WBC 6.7 09/05/2022   HGB 15.1 09/05/2022   HCT 45.8 09/05/2022   PLT 205.0 09/05/2022   GLUCOSE 104 (H) 12/12/2022   CHOL 153 12/06/2021   TRIG 72.0 12/06/2021   HDL 46.00 12/06/2021   LDLDIRECT 134.5 12/15/2006   LDLCALC 93 12/06/2021   ALT 31 12/12/2022   AST 34 12/12/2022   NA 137 12/12/2022   K 4.0 12/12/2022   CL 99 12/12/2022   CREATININE 0.90 12/12/2022   BUN 16 12/12/2022   CO2 31 12/12/2022   TSH 1.95 09/05/2022   PSA 1.02 12/06/2021   INR 1.0  09/05/2022   HGBA1C 6.2 09/05/2022    DG Foot Complete Right  Result Date: 10/04/2021 CLINICAL DATA:  Puncture injury along the plantar foot yesterday EXAM: RIGHT FOOT COMPLETE - 3+ VIEW COMPARISON:  None. FINDINGS: No evidence of fracture or radiopaque foreign object. No abnormal finding. IMPRESSION: Negative radiographs.  No fracture or radiopaque foreign object. Electronically Signed   By: Nelson Chimes M.D.   On: 10/04/2021 11:54    Assessment & Plan:   Adell was seen today for hypothyroidism.  Diagnoses and all orders for this visit:  Prediabetes- Recent A1c was 6.2%. -     Basic metabolic panel; Future -     Basic metabolic panel  Primary biliary cholangitis ([place])- His LFTs have improved.  Will continue Actigall. -     Hepatic function panel; Future -     Hepatic function panel  Stage 3a chronic kidney disease (HCC) -     Urinalysis, Routine w reflex microscopic; Future -     Basic metabolic panel; Future -     Basic metabolic panel -     Urinalysis, Routine w reflex microscopic  Chronic renal disease, stage 2, mildly decreased glomerular filtration rate (GFR) between 60-89 mL/min/1.73 square meter- His renal function has improved.  He is avoiding nephrotoxic agents.   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  No orders of the defined types were placed in this encounter.    Follow-up: No follow-ups on file.  Scarlette Calico, MD

## 2022-12-13 ENCOUNTER — Encounter: Payer: Self-pay | Admitting: Internal Medicine

## 2022-12-17 ENCOUNTER — Ambulatory Visit (INDEPENDENT_AMBULATORY_CARE_PROVIDER_SITE_OTHER): Payer: Medicare Other | Admitting: Sports Medicine

## 2022-12-17 VITALS — BP 116/80 | Ht 68.0 in | Wt 160.0 lb

## 2022-12-17 DIAGNOSIS — G8929 Other chronic pain: Secondary | ICD-10-CM | POA: Insufficient documentation

## 2022-12-17 DIAGNOSIS — M25561 Pain in right knee: Secondary | ICD-10-CM

## 2022-12-17 DIAGNOSIS — M25511 Pain in right shoulder: Secondary | ICD-10-CM | POA: Diagnosis not present

## 2022-12-17 NOTE — Assessment & Plan Note (Signed)
He was doing well with biceps tendon rehab until he began some overhead presses.  He does have a history of two-level neck fusion.  We encouraged him to discontinue weighted overhead press activities.  He may continue with chest press, bicep curls front and lateral raises.  He verbalized understanding.  Follow-up as needed.

## 2022-12-17 NOTE — Assessment & Plan Note (Signed)
Patient was able to ski on his trip with no acute issues, he may have some trace swelling however he has good range of motion and is able to ambulate and complete his activities of daily living and exercise that he desires.  Would recommend compression sleeve after days of lots of walking or vigorous activity.  He verbalized understanding.  He has some known meniscal pathology but would hold off on any surgical intervention.  He is doing well, with no need for cortisone injection today.

## 2022-12-17 NOTE — Progress Notes (Addendum)
Established Patient Office Visit  Subjective   Patient ID: William Bjornstad, MD, male    DOB: [birth year]  Age: 72 y.o. MRN: [id number]  R knee follow up and R shoulder follow up.  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  Since we last saw him he was able to go on his ski trip with no new injuries or acute exacerbations of his knee pain.  He was initially using his compression sleeve pretty regularly however has not needed it recently.  He does still have some slight pain especially with kneeling and stairs.  This pain does not wake him from sleep at night.  He denies any buckling, locking or instability.  He had some slight discomfort in his knees after yard work so switched out his regular walking for stationary bike which gave him some relief but he did feel some fullness in the back of his knee.  He has a history of Baker's cyst.  In light of his right shoulder.  There was concern for some biceps tendinopathy so he was given some home exercises.  He has been completing his biceps and rotator cuff home exercises with low weights with only some slight discomfort with overhead press with 3 pounds, which caused an exacerbation of his shoulder pain.  Other than that he is doing very well rehabbing his shoulder.   ROS as listed above in HPI    Objective:     BP 116/80   Ht '5\' 8"'$  (1.727 m)   Wt 160 lb (72.6 kg)   BMI 24.33 kg/m   Physical Exam Vitals reviewed.  Constitutional:      General: He is not in acute distress.    Appearance: Normal appearance. He is not ill-appearing, toxic-appearing or diaphoretic.  Pulmonary:     Effort: Pulmonary effort is normal.  Neurological:     Mental Status: He is alert.   R knee: no obvious deformity, no ecchymosis or edema, trace effusion. Slight TTP medial and lateral joint line.  Full range of motion with flexion and extension.  Stable to varus and valgus stress.  Negative Lachman's.  Negative McMurray's.  No  palpable Baker's cyst today.  Normal gait. Right shoulder: No obvious deformity and asymmetry.  No tenderness to palpation to the [facility] joint.  Full range of motion with forward flexion, abduction.  Nonpainful arc.  Strength 5/5 with rotator cuff testing in all directions.  Negative crossarm test.    Assessment & Plan:   Problem List Items Addressed This Visit       Other   Chronic pain of right knee - Primary    Patient was able to ski on his trip with no acute issues, he may have some trace swelling however he has good range of motion and is able to ambulate and complete his activities of daily living and exercise that he desires.  Would recommend compression sleeve after days of lots of walking or vigorous activity.  He verbalized understanding.  He has some known meniscal pathology but would hold off on any surgical intervention.  He is doing well, with no need for cortisone injection today.      Pain in joint of right shoulder    He was doing well with biceps tendon rehab until he began some overhead presses.  He does have a history of two-level neck fusion.  We encouraged him to discontinue weighted overhead press activities.  He may continue with chest  press, bicep curls front and lateral raises.  He verbalized understanding.  Follow-up as needed.       Return if symptoms worsen or fail to improve.    Elmore Guise, DO  I observed and examined the patient with the [facility] resident and agree with assessment and plan.  Note reviewed and modified by me. [name] Mcgill, MD  02/18/2023 More RT knee issues.  Plan to get standing knee films bilaterally. We probably will need an MRI of RT if this one is locking, giving out some.  KBF  02/20/23 Knee plain films unremarkable but RT knee is still not felling stable and is giving way with rotation.  Schedule MRI to assess for meniscal tear. KBF

## 2022-12-27 DIAGNOSIS — D225 Melanocytic nevi of trunk: Secondary | ICD-10-CM | POA: Diagnosis not present

## 2022-12-27 DIAGNOSIS — L814 Other melanin hyperpigmentation: Secondary | ICD-10-CM | POA: Diagnosis not present

## 2022-12-27 DIAGNOSIS — L821 Other seborrheic keratosis: Secondary | ICD-10-CM | POA: Diagnosis not present

## 2023-01-01 ENCOUNTER — Other Ambulatory Visit: Payer: Self-pay | Admitting: Internal Medicine

## 2023-01-01 DIAGNOSIS — K743 Primary biliary cirrhosis: Secondary | ICD-10-CM

## 2023-02-10 DIAGNOSIS — K148 Other diseases of tongue: Secondary | ICD-10-CM | POA: Diagnosis not present

## 2023-02-11 LAB — LAB REPORT - SCANNED
A1c: 6.1
EGFR: 71

## 2023-02-11 LAB — LIPID PANEL
Cholesterol: 164 (ref 0–200)
HDL: 46 (ref 35–70)
LDL Cholesterol: 97
Triglycerides: 117 (ref 40–160)

## 2023-02-11 LAB — PSA: PSA: 1

## 2023-02-18 ENCOUNTER — Encounter: Payer: Self-pay | Admitting: *Deleted

## 2023-02-18 ENCOUNTER — Other Ambulatory Visit: Payer: Self-pay | Admitting: *Deleted

## 2023-02-18 ENCOUNTER — Ambulatory Visit
Admission: RE | Admit: 2023-02-18 | Discharge: 2023-02-18 | Disposition: A | Discharge status: Home / Self Care | Payer: Medicare Other | Source: Ambulatory Visit | Attending: Sports Medicine | Admitting: Sports Medicine

## 2023-02-18 DIAGNOSIS — M25562 Pain in left knee: Secondary | ICD-10-CM

## 2023-02-18 NOTE — Progress Notes (Signed)
Order placed for bilateral knee films. Once Fields reviews the images, he will then determine if right knee MRI is warranted.

## 2023-02-21 ENCOUNTER — Encounter: Payer: Self-pay | Admitting: *Deleted

## 2023-02-21 ENCOUNTER — Other Ambulatory Visit: Payer: Self-pay | Admitting: *Deleted

## 2023-02-21 DIAGNOSIS — G8929 Other chronic pain: Secondary | ICD-10-CM

## 2023-02-25 ENCOUNTER — Ambulatory Visit
Admission: RE | Admit: 2023-02-25 | Discharge: 2023-02-25 | Disposition: A | Discharge status: Home / Self Care | Payer: Medicare Other | Source: Ambulatory Visit | Attending: Sports Medicine | Admitting: Sports Medicine

## 2023-02-25 ENCOUNTER — Other Ambulatory Visit: Payer: Self-pay | Admitting: Internal Medicine

## 2023-02-25 DIAGNOSIS — R4681 Obsessive-compulsive behavior: Secondary | ICD-10-CM

## 2023-02-25 DIAGNOSIS — S83241A Other tear of medial meniscus, current injury, right knee, initial encounter: Secondary | ICD-10-CM | POA: Diagnosis not present

## 2023-02-25 DIAGNOSIS — M25461 Effusion, right knee: Secondary | ICD-10-CM | POA: Diagnosis not present

## 2023-02-25 DIAGNOSIS — M7121 Synovial cyst of popliteal space [Baker], right knee: Secondary | ICD-10-CM | POA: Diagnosis not present

## 2023-02-25 DIAGNOSIS — G8929 Other chronic pain: Secondary | ICD-10-CM

## 2023-02-25 DIAGNOSIS — E785 Hyperlipidemia, unspecified: Secondary | ICD-10-CM

## 2023-03-02 ENCOUNTER — Other Ambulatory Visit: Payer: Medicare Other

## 2023-03-12 ENCOUNTER — Encounter: Payer: Self-pay | Admitting: Sports Medicine

## 2023-03-17 ENCOUNTER — Other Ambulatory Visit: Payer: Self-pay | Admitting: Transplant Hepatology

## 2023-03-17 DIAGNOSIS — K862 Cyst of pancreas: Secondary | ICD-10-CM

## 2023-03-17 DIAGNOSIS — K754 Autoimmune hepatitis: Secondary | ICD-10-CM | POA: Diagnosis not present

## 2023-03-17 DIAGNOSIS — K743 Primary biliary cirrhosis: Secondary | ICD-10-CM | POA: Diagnosis not present

## 2023-03-19 ENCOUNTER — Encounter: Payer: Self-pay | Admitting: Internal Medicine

## 2023-03-25 DIAGNOSIS — H35373 Puckering of macula, bilateral: Secondary | ICD-10-CM | POA: Diagnosis not present

## 2023-04-10 ENCOUNTER — Encounter: Payer: Self-pay | Admitting: Internal Medicine

## 2023-04-29 ENCOUNTER — Ambulatory Visit
Admission: RE | Admit: 2023-04-29 | Discharge: 2023-04-29 | Disposition: A | Discharge status: Home / Self Care | Payer: Medicare Other | Source: Ambulatory Visit | Attending: Transplant Hepatology | Admitting: Transplant Hepatology

## 2023-04-29 DIAGNOSIS — K862 Cyst of pancreas: Secondary | ICD-10-CM

## 2023-04-29 MED ORDER — GADOPICLENOL 0.5 MMOL/ML IV SOLN
7.0000 mL | Freq: Once | INTRAVENOUS | Status: AC | PRN
  Administered 2023-04-29: 7 mL via INTRAVENOUS

## 2023-05-05 DIAGNOSIS — R932 Abnormal findings on diagnostic imaging of liver and biliary tract: Secondary | ICD-10-CM | POA: Diagnosis not present

## 2023-05-14 ENCOUNTER — Other Ambulatory Visit: Payer: Self-pay | Admitting: Internal Medicine

## 2023-05-14 DIAGNOSIS — E034 Atrophy of thyroid (acquired): Secondary | ICD-10-CM

## 2023-05-15 ENCOUNTER — Ambulatory Visit (INDEPENDENT_AMBULATORY_CARE_PROVIDER_SITE_OTHER): Payer: Medicare Other | Admitting: Internal Medicine

## 2023-05-15 ENCOUNTER — Encounter: Payer: Self-pay | Admitting: Internal Medicine

## 2023-05-15 VITALS — BP 118/66 | HR 68 | Temp 97.9°F | Ht 68.0 in | Wt 161.0 lb

## 2023-05-15 DIAGNOSIS — K743 Primary biliary cirrhosis: Secondary | ICD-10-CM | POA: Diagnosis not present

## 2023-05-15 DIAGNOSIS — E785 Hyperlipidemia, unspecified: Secondary | ICD-10-CM

## 2023-05-15 DIAGNOSIS — N400 Enlarged prostate without lower urinary tract symptoms: Secondary | ICD-10-CM

## 2023-05-15 DIAGNOSIS — R4681 Obsessive-compulsive behavior: Secondary | ICD-10-CM | POA: Diagnosis not present

## 2023-05-15 DIAGNOSIS — N182 Chronic kidney disease, stage 2 (mild): Secondary | ICD-10-CM | POA: Diagnosis not present

## 2023-05-15 DIAGNOSIS — R7303 Prediabetes: Secondary | ICD-10-CM

## 2023-05-15 DIAGNOSIS — E034 Atrophy of thyroid (acquired): Secondary | ICD-10-CM

## 2023-05-15 LAB — CBC WITH DIFFERENTIAL/PLATELET
Basophils Absolute: 0 10*3/uL (ref 0.0–0.1)
Basophils Relative: 0.2 % (ref 0.0–3.0)
Eosinophils Absolute: 0.1 10*3/uL (ref 0.0–0.7)
Eosinophils Relative: 2.2 % (ref 0.0–5.0)
HCT: 44.8 % (ref 39.0–52.0)
Hemoglobin: 14.9 g/dL (ref 13.0–17.0)
Lymphocytes Relative: 24.1 % (ref 12.0–46.0)
Lymphs Abs: 1.4 10*3/uL (ref 0.7–4.0)
MCHC: 33.3 g/dL (ref 30.0–36.0)
MCV: 87 fl (ref 78.0–100.0)
Monocytes Absolute: 0.5 10*3/uL (ref 0.1–1.0)
Monocytes Relative: 8.2 % (ref 3.0–12.0)
Neutro Abs: 3.9 10*3/uL (ref 1.4–7.7)
Neutrophils Relative %: 65.3 % (ref 43.0–77.0)
Platelets: 198 10*3/uL (ref 150.0–400.0)
RBC: 5.16 Mil/uL (ref 4.22–5.81)
RDW: 13.6 % (ref 11.5–15.5)
WBC: 6 10*3/uL (ref 4.0–10.5)

## 2023-05-15 LAB — BASIC METABOLIC PANEL WITH GFR
BUN: 16 mg/dL (ref 6–23)
CO2: 33 meq/L — ABNORMAL HIGH (ref 19–32)
Calcium: 10.1 mg/dL (ref 8.4–10.5)
Chloride: 100 meq/L (ref 96–112)
Creatinine, Ser: 1.04 mg/dL (ref 0.40–1.50)
GFR: 70.88 mL/min
Glucose, Bld: 104 mg/dL — ABNORMAL HIGH (ref 70–99)
Potassium: 4.3 meq/L (ref 3.5–5.1)
Sodium: 138 meq/L (ref 135–145)

## 2023-05-15 LAB — HEPATIC FUNCTION PANEL
ALT: 31 U/L (ref 0–53)
AST: 38 U/L — ABNORMAL HIGH (ref 0–37)
Albumin: 4.6 g/dL (ref 3.5–5.2)
Alkaline Phosphatase: 74 U/L (ref 39–117)
Bilirubin, Direct: 0.2 mg/dL (ref 0.0–0.3)
Total Bilirubin: 0.7 mg/dL (ref 0.2–1.2)
Total Protein: 8 g/dL (ref 6.0–8.3)

## 2023-05-15 LAB — PROTIME-INR
INR: 1.2 ratio — ABNORMAL HIGH (ref 0.8–1.0)
Prothrombin Time: 12.6 s (ref 9.6–13.1)

## 2023-05-15 LAB — TSH: TSH: 3.37 u[IU]/mL (ref 0.35–5.50)

## 2023-05-15 LAB — VITAMIN D 25 HYDROXY (VIT D DEFICIENCY, FRACTURES): VITD: 46.22 ng/mL (ref 30.00–100.00)

## 2023-05-15 LAB — HEMOGLOBIN A1C: Hgb A1c MFr Bld: 6 % (ref 4.6–6.5)

## 2023-05-15 MED ORDER — LEVOTHYROXINE SODIUM 112 MCG PO TABS
112.0000 ug | ORAL_TABLET | Freq: Every day | ORAL | 1 refills | Status: DC
Start: 2023-05-15 — End: 2023-11-18

## 2023-05-15 NOTE — Progress Notes (Signed)
Subjective:  Patient ID: William Abed, MD, male    DOB: 1951-08-10  Age: 72 y.o. MRN: [id number]  CC: Hypothyroidism and Hyperlipidemia   HPI William Abed, MD presents for f/up ----  Discussed the use of AI scribe software for clinical note transcription with the patient, who gave verbal consent to proceed.  History of Present Illness   The patient, diagnosed with primary biliary cholangitis, reports a stable condition with no new symptoms. They had an MRI elastography a year ago, which showed no fibrosis but revealed a small cyst. A follow-up MRI was conducted recently, which showed no changes in the cyst. The patient's liver function tests are reported as normal.  The patient also reports a new lump on the outside of their jawbone, which was evaluated by an oral surgeon.  In addition, the patient mentions ongoing issues with their knees, attributed to meniscus problems. They express a desire to increase their physical activity, specifically bike riding, to help manage this issue.       Outpatient Medications Prior to Visit  Medication Sig Dispense Refill   atorvastatin (LIPITOR) 20 MG tablet TAKE 1 TABLET DAILY 90 tablet 0   cetirizine (ZYRTEC) 10 MG tablet Take 10 mg by mouth daily.     sertraline (ZOLOFT) 50 MG tablet TAKE 1 TABLET TWICE A DAY 180 tablet 0   ursodiol (ACTIGALL) 250 MG tablet TAKE FOUR TABLETS BY MOUTH DAILY 360 tablet 1   zaleplon (SONATA) 10 MG capsule Take 1 capsule (10 mg total) by mouth at bedtime as needed for sleep. 50 capsule 1   levothyroxine (SYNTHROID) 112 MCG tablet TAKE 1 TABLET DAILY 90 tablet 1   No facility-administered medications prior to visit.    ROS Review of Systems  Constitutional:  Negative for chills, diaphoresis and fatigue.  HENT: Negative.    Eyes: Negative.   Respiratory: Negative.  Negative for cough, chest tightness, shortness of breath and wheezing.   Cardiovascular:  Negative for chest pain, palpitations and leg swelling.   Gastrointestinal: Negative.  Negative for abdominal pain, constipation, diarrhea, nausea and vomiting.  Endocrine: Negative.  Negative for cold intolerance and heat intolerance.  Genitourinary:  Negative for difficulty urinating.  Musculoskeletal:  Positive for arthralgias. Negative for myalgias.  Skin: Negative.   Neurological: Negative.  Negative for dizziness and weakness.  Hematological:  Negative for adenopathy. Does not bruise/bleed easily.  Psychiatric/Behavioral: Negative.      Objective:  BP 118/66 (BP Location: Left Arm, Patient Position: Sitting, Cuff Size: Large)   Pulse 68   Temp 97.9 F (36.6 C) (Oral)   Ht 5\' 8"  (1.727 m)   Wt 161 lb (73 kg)   SpO2 96%   BMI 24.48 kg/m   BP Readings from Last 3 Encounters:  05/15/23 118/66  12/17/22 116/80  12/12/22 126/82    Wt Readings from Last 3 Encounters:  05/15/23 161 lb (73 kg)  12/17/22 160 lb (72.6 kg)  12/12/22 165 lb (74.8 kg)    Physical Exam Vitals reviewed.  Constitutional:      Appearance: Normal appearance. He is not ill-appearing.  HENT:     Mouth/Throat:     Mouth: Mucous membranes are moist.  Eyes:     General: No scleral icterus.    Conjunctiva/sclera: Conjunctivae normal.  Cardiovascular:     Rate and Rhythm: Normal rate and regular rhythm.     Heart sounds: Normal heart sounds, S1 normal and S2 normal. No murmur heard.    No gallop.  Comments: EKG--- NSR 63 bpm ?LAE No LVH, Q waves, or ST/T wave changes Pulmonary:     Effort: Pulmonary effort is normal.     Breath sounds: No stridor. No wheezing, rhonchi or rales.  Abdominal:     General: Abdomen is flat.     Palpations: There is no mass.     Tenderness: There is no abdominal tenderness. There is no guarding.     Hernia: No hernia is present.  Musculoskeletal:     Cervical back: Neck supple.     Right lower leg: No edema.     Left lower leg: No edema.  Lymphadenopathy:     Cervical: No cervical adenopathy.  Skin:    General:  Skin is warm and dry.  Neurological:     General: No focal deficit present.     Mental Status: He is alert.  Psychiatric:        Mood and Affect: Mood normal.        Behavior: Behavior normal.     Lab Results  Component Value Date   WBC 6.0 05/15/2023   HGB 14.9 05/15/2023   HCT 44.8 05/15/2023   PLT 198.0 05/15/2023   GLUCOSE 104 (H) 05/15/2023   CHOL 164 02/11/2023   TRIG 117 02/11/2023   HDL 46 02/11/2023   LDLDIRECT 134.5 12/15/2006   LDLCALC 97 02/11/2023   ALT 31 05/15/2023   AST 38 (H) 05/15/2023   NA 138 05/15/2023   K 4.3 05/15/2023   CL 100 05/15/2023   CREATININE 1.04 05/15/2023   BUN 16 05/15/2023   CO2 33 (H) 05/15/2023   TSH 3.37 05/15/2023   PSA 1.0 02/11/2023   INR 1.2 (H) 05/15/2023   HGBA1C 6.0 05/15/2023    MR ABDOMEN WWO CONTRAST  Result Date: 05/02/2023 CLINICAL DATA:  72 year old male with history of pancreatic cyst. History of squamous cell carcinoma of the tongue in 2012. EXAM: MRI ABDOMEN WITHOUT AND WITH CONTRAST TECHNIQUE: Multiplanar multisequence MR imaging of the abdomen was performed both before and after the administration of intravenous contrast. CONTRAST:  7 mL of Vueway. COMPARISON:  No prior abdominal MRI. FINDINGS: Lower chest: Unremarkable. Hepatobiliary: There are several small T1 hypointense, T2 hyperintense, nonenhancing lesions in the liver measuring up to 1.1 cm in diameter, compatible with tiny cysts and/or biliary hamartomas (benign, requiring no imaging follow-up). No other aggressive appearing hepatic lesions. Status post cholecystectomy. MRCP images demonstrate no intrahepatic biliary ductal dilatation. Common bile duct is slightly prominent measuring 7 mm, within normal limits for the patient's age and post cholecystectomy status. There are no filling defects within the common bile duct to suggest choledocholithiasis. Pancreas: A few scattered tiny T1 hypointense, T2 hyperintense, nonenhancing lesions are noted in the pancreas,  largest of which is in the body of the pancreas measuring only 4 mm (axial image 19 of series 4). These do not appear to communicate with the main pancreatic duct. MRCP images demonstrate no dilatation of the main pancreatic duct. No solid enhancing pancreatic neoplasm noted. No peripancreatic fluid collections or inflammatory changes. Spleen:  Unremarkable. Adrenals/Urinary Tract: Multiple T1 hypointense, T2 hyperintense, nonenhancing lesions in the left kidney, compatible with simple cysts (Bosniak class 1, no imaging follow-up recommended), largest of which is partially exophytic in the lateral aspect of the interpolar region measuring 3.9 cm (axial image 29 of series 4). No other suspicious renal lesions. Right kidney and bilateral adrenal glands are normal in appearance. No hydroureteronephrosis in the visualized portions of the abdomen. Stomach/Bowel: Visualized  portions are unremarkable. Vascular/Lymphatic: No aneurysm identified in the visualized abdominal vasculature. Retroaortic left renal vein (normal anatomical variant) incidentally noted. No lymphadenopathy noted in the abdomen. Other: No significant volume of ascites noted in the visualized portions of the peritoneal cavity. Musculoskeletal: No aggressive appearing osseous lesions are noted in the visualized portions of the skeleton. IMPRESSION: 1. 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 Repeat abdominal MRI with and without IV gadolinium with MRCP is recommended in 2 years to ensure stability. This recommendation follows ACR consensus guidelines: Management of Incidental Pancreatic Cysts: A White Paper of the ACR Incidental Findings Committee. J Am Coll Radiol 2017;14:911-923. 2. Additional incidental findings, as above. Electronically Signed   By: Trudie Reed M.D.   On: 05/02/2023 11:08     Assessment & Plan:   Benign prostatic hyperplasia without lower urinary tract symptoms -     VITAMIN D 25 Hydroxy (Vit-D Deficiency, Fractures); Future -     Vitamin A; Future -     Vitamin E; Future  Chronic renal disease, stage 2, mildly decreased glomerular filtration rate (GFR) between 60-89 mL/min/1.73 square meter- His renal function is stable. -     VITAMIN D 25 Hydroxy (Vit-D Deficiency, Fractures); Future -     Vitamin A; Future -     Vitamin E; Future -     CBC with Differential/Platelet; Future -     Basic metabolic panel; Future  Hyperlipidemia LDL goal <130 -     VITAMIN D 25 Hydroxy (Vit-D Deficiency, Fractures); Future -     Vitamin A; Future -     Vitamin E; Future -     Lipoprotein A (LPA); Future -     Hepatic function panel; Future -     EKG 12-Lead  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 atrophy of thyroid- He is euthyroid. -     TSH; Future -     VITAMIN D 25 Hydroxy (Vit-D Deficiency, Fractures); Future -     Vitamin A; Future -     Vitamin E; Future -     EKG 12-Lead -     Levothyroxine Sodium; Take 1 tablet (112 mcg total) by mouth daily.  Dispense: 90 tablet; Refill: 1  Prediabetes -     VITAMIN D 25 Hydroxy (Vit-D Deficiency, Fractures); Future -     Vitamin A; Future -     Vitamin E; Future -     Hemoglobin A1c; Future -     EKG 12-Lead  Primary biliary cholangitis (HCC) -     VITAMIN D 25 Hydroxy (Vit-D Deficiency, Fractures); Future -     Vitamin A; Future -     Vitamin E; Future -     CBC with Differential/Platelet; Future -     Basic metabolic panel; Future -     Hepatic function panel; Future -     Protime-INR; Future     Follow-up: Return in about 6 months (around 11/14/2023).  Sanda Linger, MD

## 2023-05-15 NOTE — Patient Instructions (Signed)

## 2023-05-19 ENCOUNTER — Other Ambulatory Visit: Payer: Self-pay | Admitting: Internal Medicine

## 2023-05-19 DIAGNOSIS — R4681 Obsessive-compulsive behavior: Secondary | ICD-10-CM

## 2023-05-19 DIAGNOSIS — E785 Hyperlipidemia, unspecified: Secondary | ICD-10-CM

## 2023-05-19 LAB — VITAMIN A: Vitamin A (Retinoic Acid): 61 ug/dL (ref 38–98)

## 2023-05-19 LAB — LIPOPROTEIN A (LPA): Lipoprotein (a): 59 nmol/L (ref <75)

## 2023-05-19 LAB — VITAMIN E
Gamma-Tocopherol (Vit E): 1.8 mg/L (ref <4.4)
Vitamin E (Alpha Tocopherol): 10 mg/L (ref 5.7–19.9)

## 2023-05-19 MED ORDER — SERTRALINE HCL 50 MG PO TABS: 50.0000 mg | ORAL_TABLET | Freq: Two times a day (BID) | ORAL | 1 refills | Status: DC

## 2023-05-19 MED ORDER — ATORVASTATIN CALCIUM 20 MG PO TABS: 20.0000 mg | ORAL_TABLET | Freq: Every day | ORAL | 1 refills | Status: DC

## 2023-06-27 ENCOUNTER — Other Ambulatory Visit: Payer: Self-pay | Admitting: Internal Medicine

## 2023-06-27 DIAGNOSIS — K743 Primary biliary cirrhosis: Secondary | ICD-10-CM

## 2023-07-16 ENCOUNTER — Other Ambulatory Visit: Payer: Self-pay

## 2023-07-16 ENCOUNTER — Ambulatory Visit (INDEPENDENT_AMBULATORY_CARE_PROVIDER_SITE_OTHER): Payer: Medicare Other | Admitting: Sports Medicine

## 2023-07-16 VITALS — BP 122/76 | Ht 68.0 in | Wt 160.0 lb

## 2023-07-16 DIAGNOSIS — G8929 Other chronic pain: Secondary | ICD-10-CM

## 2023-07-16 DIAGNOSIS — M25511 Pain in right shoulder: Secondary | ICD-10-CM | POA: Diagnosis not present

## 2023-07-16 DIAGNOSIS — M7581 Other shoulder lesions, right shoulder: Secondary | ICD-10-CM

## 2023-07-16 MED ORDER — NITROGLYCERIN 0.2 MG/HR TD PT24: MEDICATED_PATCH | TRANSDERMAL | 1 refills | Status: DC

## 2023-07-16 NOTE — Patient Instructions (Signed)

## 2023-07-16 NOTE — Assessment & Plan Note (Signed)
Trial on a nitroglycerin protocol Home exercise plan with modified rotator cuff exercises Working on internal rotation strength Codman exercises to keep up motion and advised to be cautious about losing any motion as he is at risk for frozen shoulder  Plan recheck in 6 weeks

## 2023-07-16 NOTE — Progress Notes (Signed)
PCP: Etta Grandchild, MD  Subjective:   HPI: Patient is a 72 y.o. male here for right shoulder pain.  Pain started >2 months ago, does not know of a particular injury. Pain is with internal rotation of shoulder when tucking in shirt or when putting his seat belt on. Additionally it is difficult to sleep at night 2/2 pain. Despite pain he remains physically active. Most recently carried a 45 lb boat battery without issue.  Distant history of cervical neck surgery in his 32s. States he is familiar with radiculopathy and this is not it.  His primary concern is pain that is not improving. Pain sometimes radiates to his proximal forearm but never down to his hand He does not see a pattern where the activity he does makes this significantly worse  Past Medical History:  Diagnosis Date   Adenomatous colon polyp    2003   ANEMIA, IRON DEFICIENCY    mild in the past   BPH (benign prostatic hyperplasia)    CERVICAL RADICULOPATHY    disectomy in 1990s   DDD (degenerative disc disease), lumbar oct '12   L4-5, ESI therapy successful 10/12; L5-S1 radiculopathy 3/13   Fundic gland polyps of stomach, benign    GERD    EGD negative x 3, off medication (July '13)   Hearing loss in left ear    HYPERLIPIDEMIA, MILD    lipitor 10mg     Hypothyroidism    Liver cyst    Primary biliary cholangitis (HCC) 09/18/2018   Renal cyst    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 90 tablet 1   cetirizine (ZYRTEC) 10 MG tablet Take 10 mg by mouth daily.     levothyroxine (SYNTHROID) 112 MCG tablet Take 1 tablet (112 mcg total) by mouth daily. 90 tablet 1   sertraline (ZOLOFT) 50 MG tablet Take 1 tablet  (50 mg total) by mouth 2 (two) times daily. 180 tablet 1   ursodiol (ACTIGALL) 250 MG tablet TAKE 4 TABLETS BY MOUTH DAILY 360 tablet 1   zaleplon (SONATA) 10 MG capsule Take 1 capsule (10 mg total) by mouth at bedtime as needed for sleep. 50 capsule 1   No current facility-administered medications on file prior to visit.    Past Surgical History:  Procedure Laterality Date   BACK SURGERY  '90s ([place])   CERVICAL DISC - diskectomy w/ fusion, anterior   CHOLECYSTECTOMY N/A 01/05/2021   Procedure: LAPAROSCOPIC CHOLECYSTECTOMY WITH INTRAOPERATIVE CHOLANGIOGRAM;  Surgeon: Luretha Murphy, MD;  Location: [facility];  Service: General;  Laterality: N/A;   COLONOSCOPY     Multiple   ESOPHAGOGASTRODUODENOSCOPY     Multiple   HEMIGLOSSECTOMY  10/30/2011   Procedure: HEMIGLOSSECTOMY;  Surgeon: [name];  [facility]: [facility];  Service: ENT;  Laterality: Right;  Right tongue resection   LUMBAR LAMINECTOMY/DECOMPRESSION MICRODISCECTOMY  01/29/2012   Procedure: LUMBAR LAMINECTOMY/DECOMPRESSION MICRODISCECTOMY;  Surgeon: Barnett Abu, MD;  [facility];  Service: Neurosurgery;  Laterality: Left;  Left L5-S1 Microdiskectomy   LUMBAR LAMINECTOMY/DECOMPRESSION MICRODISCECTOMY  07/13/2012   Procedure: LUMBAR LAMINECTOMY/DECOMPRESSION MICRODISCECTOMY 1 LEVEL;  Surgeon: Barnett Abu, MD;  [facility];  Service: Neurosurgery;  Laterality: Left;  Redo Left Lumbar five-sacral one Microdiskectomy   nail avulsion     right first finger due to a wart   RADICAL NECK DISSECTION  10/30/2011   Procedure: RADICAL NECK DISSECTION;  Surgeon: [name];  [facility];  Service: ENT;  Laterality: Right;    No Known Allergies  There were no vitals taken for this visit.     09/24/2022    9:00 AM  [facility]  Frequency of aerobic exercise (# of days/week) 2  Average time in minutes 60  Frequency of strengthening activities (# of days/week) 0        No data to  display              Objective:  Physical Exam: BP 122/76   Ht 5\' 8"  (1.727 m)   Wt 160 lb (72.6 kg)   BMI 24.33 kg/m   Gen: NAD, comfortable in exam room  Right shoulder: No gross deformity, no ecchymosis, no swelling.  No TTP.  Marked limitation in internal rotation, normal ROM with abduction, abduction, flexion, external rotation.  5/5 strength and normal sensation. Negative empty can with good strength Positive Hawkins Negative arm adduction test Negative O'Brien's Negative Yergason  Crossover test causes some mild pain at the right [facility] joint  Ultrasound of right shoulder  AC joint shows moderate arthritic change with an effusion Subscapularis tendon shows a distal calcification and some mild hypoechoic change However on motion the subscapularis tendon shows that the calcification seems to cut into the tendon with some hypoechoic change of a small tear present Infraspinatus and teres minor were normal Supraspinatus was normal Biceps tendon was normal in short and long axis Glenohumeral joint generally looked normal in appearance  Impression: Some calcific injury to the subscapularis tendon right shoulder; moderate AC joint arthritis with effusion  Ultrasound and interpretation by Sibyl Parr. Darrick Penna, MD and Tiffany Kocher, DO     Assessment & Plan:  1. Right shoulder pain: 2/2 calcific tendinosis of subscapularis.  Evidence of calcific fragment causing microtear and inflammation of subscapularis.  Home exercise plan demonstrated.  Trial nitroglycerin patch protocol.  If patient fails to improve in the next 6 weeks could consider corticosteroid injection versus shockwave therapy.  Follow-up in 6 weeks, or sooner if needed.  I observed and examined the patient with the resident and agree with assessment and plan.  Note reviewed and modified by me. Sterling Big, MD

## 2023-07-27 DIAGNOSIS — Z23 Encounter for immunization: Secondary | ICD-10-CM | POA: Diagnosis not present

## 2023-08-24 ENCOUNTER — Encounter: Payer: Self-pay | Admitting: Internal Medicine

## 2023-08-27 ENCOUNTER — Encounter: Payer: Self-pay | Admitting: Sports Medicine

## 2023-08-27 ENCOUNTER — Ambulatory Visit (INDEPENDENT_AMBULATORY_CARE_PROVIDER_SITE_OTHER): Payer: Medicare Other | Admitting: Sports Medicine

## 2023-08-27 VITALS — BP 112/50 | Ht 68.0 in

## 2023-08-27 DIAGNOSIS — G8929 Other chronic pain: Secondary | ICD-10-CM

## 2023-08-27 DIAGNOSIS — M25511 Pain in right shoulder: Secondary | ICD-10-CM | POA: Diagnosis not present

## 2023-08-27 NOTE — Progress Notes (Signed)
PCP: Etta Grandchild, MD  Subjective:   HPI: Patient is a 72 y.o. male here for Follow-up of his right shoulder.  Patient has been doing the exercises that was recommended at his previous visit approximately 6 weeks ago.  At that time patient was noted to have some calcific tendinosis of the subscapularis.  Patient states that has been doing exercises and has definitely increased his range of motion.  Patient is able to place his hand near his lumbar area.  Patient states that he feels definitely that there has been improvement with his range of motion but is still little bit hesitant to do certain motions given that they still have pain.  Patient otherwise has no other concerns at this time.    Patient does note that he feels like the body helix have been beneficial for his knees and he did lose them so he would like another pair.  History of degenerative meniscus injury but now seems pretty stable He wants his knees to be in good shape before he does winter skiing  He had side effects and could not tolerate the nitroglycerin   Past Medical History:  Diagnosis Date   Adenomatous colon polyp    2003   ANEMIA, IRON DEFICIENCY    mild in the past   BPH (benign prostatic hyperplasia)    CERVICAL RADICULOPATHY    disectomy in 1990s   DDD (degenerative disc disease), lumbar oct '12   L4-5, ESI therapy successful 10/12; L5-S1 radiculopathy 3/13   Fundic gland polyps of stomach, benign    GERD    EGD negative x 3, off medication (July '13)   Hearing loss in left ear    HYPERLIPIDEMIA, MILD    lipitor 10mg     Hypothyroidism    Liver cyst    Primary biliary cholangitis (HCC) 09/18/2018   Renal cyst    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 90 tablet 1   cetirizine (ZYRTEC) 10 MG tablet Take 10 mg by mouth daily.     levothyroxine (SYNTHROID) 112 MCG tablet Take 1 tablet (112 mcg total) by mouth daily. 90 tablet 1   nitroGLYCERIN (NITRODUR - DOSED IN MG/24 HR) 0.2 mg/hr patch Use 1/4 patch daily to the affected area. 30 patch 1   sertraline (ZOLOFT) 50 MG tablet Take 1 tablet (50 mg total) by mouth 2 (two) times daily. 180 tablet 1   ursodiol (ACTIGALL) 250 MG tablet TAKE 4 TABLETS BY MOUTH DAILY 360 tablet 1   zaleplon (SONATA) 10 MG capsule Take 1 capsule (10 mg total) by mouth at bedtime as needed for sleep. 50 capsule 1   No current facility-administered medications on file prior to visit.    Past Surgical History:  Procedure Laterality Date   BACK SURGERY  '90s ([place])   CERVICAL DISC - diskectomy w/ fusion, anterior   CHOLECYSTECTOMY N/A 01/05/2021   Procedure: LAPAROSCOPIC CHOLECYSTECTOMY WITH INTRAOPERATIVE CHOLANGIOGRAM;  Surgeon: Luretha Murphy, MD;  [facility];  Service: General;  Laterality: N/A;   COLONOSCOPY     Multiple   ESOPHAGOGASTRODUODENOSCOPY     Multiple   HEMIGLOSSECTOMY  10/30/2011   Procedure: HEMIGLOSSECTOMY;  Surgeon: Jonny Ruiz  Dutch Quint;  [facility];  Service: ENT;  Laterality: Right;  Right tongue resection   LUMBAR LAMINECTOMY/DECOMPRESSION MICRODISCECTOMY  01/29/2012   Procedure: LUMBAR LAMINECTOMY/DECOMPRESSION MICRODISCECTOMY;  Surgeon: Barnett Abu, MD;  [facility];  Service: Neurosurgery;  Laterality: Left;  Left L5-S1 Microdiskectomy   LUMBAR LAMINECTOMY/DECOMPRESSION MICRODISCECTOMY  07/13/2012   Procedure: LUMBAR LAMINECTOMY/DECOMPRESSION MICRODISCECTOMY 1 LEVEL;  Surgeon: Barnett Abu, MD;  [facility];  Service: Neurosurgery;  Laterality: Left;  Redo Left Lumbar five-sacral one Microdiskectomy   nail avulsion     right first finger due to a wart   RADICAL NECK DISSECTION  10/30/2011    Procedure: RADICAL NECK DISSECTION;  Surgeon: [name];  [facility];  Service: ENT;  Laterality: Right;    No Known Allergies  BP (!) 112/50   Ht 5\' 8"  (1.727 m)   BMI 24.33 kg/m      09/24/2022    9:00 AM  [facility]  Frequency of aerobic exercise (# of days/week) 2  Average time in minutes 60  Frequency of strengthening activities (# of days/week) 0        No data to display              Objective:  Physical Exam:  Gen: NAD, comfortable in exam room  Shoulder, Right:  No  skin changes, erythema, or ecchymosis noted. No evidence of bony deformity, asymmetry, or muscle atrophy; No tenderness over long head of biceps (bicipital groove). Mild TTP at [facility] joint. Full passive range of motion (180 flex Elisabeth Most /150Abd /90ER), IR is decreased.Marland Kitchen Thumb to T12 with mild tenderness. Strength 5/5 throughout. No abnormal scapular function observed. Sensation to light touch intact. Peripheral pulses intact. DTR's .   Special Tests:   - Painful Arc absent throught 180 degrees   - Empty can: POS   - Int/Ext Rotation test: POS/NEG   Rush Barer Lift-Off Test: POS   - Crossarm Adduction test: Pos   - Hawkins: NEG   - Neer test: NEG   - O'brien's test: NEG   - Yergason's: NEG   - Speeds test: NEG      Assessment & Plan:  1. 1. Chronic right shoulder pain -Patient with calcific tendinosis of subscapularis, patient has increased range of motion as well as strength.  Patient is doing well on given significant improvement over the past 6 weeks, will continue with current management of rotator cuff exercises and ROM.  -Patient was given weightbearing exercises that he may now incorporate given that he is able to tolerate everything with body weight.  Will also give patient bilateral body helix's for the knees.    Brenton Grills MD, PGY-4  Sports Medicine Fellow [facility]

## 2023-09-15 ENCOUNTER — Encounter: Payer: Self-pay | Admitting: Internal Medicine

## 2023-09-25 DIAGNOSIS — N529 Male erectile dysfunction, unspecified: Secondary | ICD-10-CM | POA: Diagnosis not present

## 2023-09-25 DIAGNOSIS — N138 Other obstructive and reflux uropathy: Secondary | ICD-10-CM | POA: Diagnosis not present

## 2023-09-25 DIAGNOSIS — N401 Enlarged prostate with lower urinary tract symptoms: Secondary | ICD-10-CM | POA: Diagnosis not present

## 2023-10-14 ENCOUNTER — Ambulatory Visit: Payer: Medicare Other | Admitting: Sports Medicine

## 2023-10-29 ENCOUNTER — Encounter: Payer: Medicare Other | Admitting: Internal Medicine

## 2023-11-03 ENCOUNTER — Ambulatory Visit: Payer: Medicare Other | Admitting: *Deleted

## 2023-11-03 VITALS — Ht 68.0 in | Wt 150.0 lb

## 2023-11-03 DIAGNOSIS — Z1211 Encounter for screening for malignant neoplasm of colon: Secondary | ICD-10-CM

## 2023-11-03 MED ORDER — NA SULFATE-K SULFATE-MG SULF 17.5-3.13-1.6 GM/177ML PO SOLN
1.0000 | Freq: Once | ORAL | 0 refills | Status: AC
Start: 2023-11-03 — End: 2023-11-03

## 2023-11-03 NOTE — Progress Notes (Signed)
 Pt's name and DOB verified at the beginning of the pre-visit wit 2 identifiers  Pt denies any difficulty with ambulating,sitting, laying down or rolling side to side  Pt has no issues with ambulation   Pt has no issues moving head neck or swallowing  No egg or soy allergy known to patient   No issues known to pt with past sedation with any surgeries or procedures  Pt denies having issues being intubated  No FH of Malignant Hyperthermia  Pt is not on diet pills or shots  Pt is not on home 02   Pt is not on blood thinners   Pt denies issues with constipation   Pt is not on dialysis  Pt denise any abnormal heart rhythms   Pt denies any upcoming cardiac testing  Pt encouraged to use to use Singlecare or Goodrx to reduce cost   Patient's chart reviewed by Cathlyn Parsons CNRA prior to pre-visit and patient appropriate for the [facility].  Pre-visit completed and red dot placed by patient's name on their procedure day (on provider's schedule).  .  Visit by phone  Pt states weight is 150 lb  Instructed pt why it is important to and  to call if they have any changes in health or new medications. Directed them to the # given and on instructions.     Instructions reviewed. Pt given both [facility] main # and MD on call # prior to instructions.  Pt states understanding. Instructed to review again prior to procedure. Pt states they will.   Instructions sent by mail with coupon and by My Chart  Coupon sent via text to mobile phone and pt verified they received it

## 2023-11-04 ENCOUNTER — Encounter: Payer: Self-pay | Admitting: Internal Medicine

## 2023-11-17 ENCOUNTER — Other Ambulatory Visit: Payer: Self-pay | Admitting: Internal Medicine

## 2023-11-17 ENCOUNTER — Encounter: Payer: Self-pay | Admitting: Internal Medicine

## 2023-11-17 ENCOUNTER — Ambulatory Visit (INDEPENDENT_AMBULATORY_CARE_PROVIDER_SITE_OTHER): Payer: Medicare Other | Admitting: Internal Medicine

## 2023-11-17 VITALS — BP 132/68 | HR 67 | Temp 97.6°F | Resp 16 | Ht 68.0 in | Wt 147.4 lb

## 2023-11-17 DIAGNOSIS — N182 Chronic kidney disease, stage 2 (mild): Secondary | ICD-10-CM | POA: Diagnosis not present

## 2023-11-17 DIAGNOSIS — E785 Hyperlipidemia, unspecified: Secondary | ICD-10-CM

## 2023-11-17 DIAGNOSIS — R7303 Prediabetes: Secondary | ICD-10-CM

## 2023-11-17 DIAGNOSIS — K743 Primary biliary cirrhosis: Secondary | ICD-10-CM | POA: Diagnosis not present

## 2023-11-17 DIAGNOSIS — K2100 Gastro-esophageal reflux disease with esophagitis, without bleeding: Secondary | ICD-10-CM

## 2023-11-17 DIAGNOSIS — R4681 Obsessive-compulsive behavior: Secondary | ICD-10-CM

## 2023-11-17 DIAGNOSIS — E034 Atrophy of thyroid (acquired): Secondary | ICD-10-CM | POA: Diagnosis not present

## 2023-11-17 DIAGNOSIS — N400 Enlarged prostate without lower urinary tract symptoms: Secondary | ICD-10-CM | POA: Diagnosis not present

## 2023-11-17 LAB — BASIC METABOLIC PANEL
BUN: 27 mg/dL — ABNORMAL HIGH (ref 6–23)
CO2: 31 meq/L (ref 19–32)
Calcium: 10.4 mg/dL (ref 8.4–10.5)
Chloride: 98 meq/L (ref 96–112)
Creatinine, Ser: 1.05 mg/dL (ref 0.40–1.50)
GFR: 69.82 mL/min (ref >60.00)
Glucose, Bld: 85 mg/dL (ref 70–99)
Potassium: 4.5 meq/L (ref 3.5–5.1)
Sodium: 140 meq/L (ref 135–145)

## 2023-11-17 LAB — CBC WITH DIFFERENTIAL/PLATELET
Basophils Absolute: 0 10*3/uL (ref 0.0–0.1)
Basophils Relative: 0.3 % (ref 0.0–3.0)
Eosinophils Absolute: 0 10*3/uL (ref 0.0–0.7)
Eosinophils Relative: 0.8 % (ref 0.0–5.0)
HCT: 45.5 % (ref 39.0–52.0)
Hemoglobin: 15.3 g/dL (ref 13.0–17.0)
Lymphocytes Relative: 20.7 % (ref 12.0–46.0)
Lymphs Abs: 1.2 10*3/uL (ref 0.7–4.0)
MCHC: 33.7 g/dL (ref 30.0–36.0)
MCV: 88.5 fL (ref 78.0–100.0)
Monocytes Absolute: 0.5 10*3/uL (ref 0.1–1.0)
Monocytes Relative: 8.3 % (ref 3.0–12.0)
Neutro Abs: 4 10*3/uL (ref 1.4–7.7)
Neutrophils Relative %: 69.9 % (ref 43.0–77.0)
Platelets: 176 10*3/uL (ref 150.0–400.0)
RBC: 5.14 Mil/uL (ref 4.22–5.81)
RDW: 14.1 % (ref 11.5–15.5)
WBC: 5.8 10*3/uL (ref 4.0–10.5)

## 2023-11-17 LAB — HEPATIC FUNCTION PANEL
ALT: 29 U/L (ref 0–53)
AST: 43 U/L — ABNORMAL HIGH (ref 0–37)
Albumin: 4.6 g/dL (ref 3.5–5.2)
Alkaline Phosphatase: 65 U/L (ref 39–117)
Bilirubin, Direct: 0.2 mg/dL (ref 0.0–0.3)
Total Bilirubin: 0.8 mg/dL (ref 0.2–1.2)
Total Protein: 7.6 g/dL (ref 6.0–8.3)

## 2023-11-17 LAB — URINALYSIS, ROUTINE W REFLEX MICROSCOPIC
Bilirubin Urine: NEGATIVE
Hgb urine dipstick: NEGATIVE
Ketones, ur: NEGATIVE
Leukocytes,Ua: NEGATIVE
Nitrite: NEGATIVE
RBC / HPF: NONE SEEN (ref 0–?)
Specific Gravity, Urine: 1.015 (ref 1.000–1.030)
Total Protein, Urine: NEGATIVE
Urine Glucose: NEGATIVE
Urobilinogen, UA: 0.2 (ref 0.0–1.0)
WBC, UA: NONE SEEN (ref 0–?)
pH: 7.5 (ref 5.0–8.0)

## 2023-11-17 LAB — LIPID PANEL
Cholesterol: 135 mg/dL (ref 0–200)
HDL: 56.7 mg/dL (ref >39.00)
LDL Cholesterol: 64 mg/dL (ref 0–99)
NonHDL: 78.38
Total CHOL/HDL Ratio: 2
Triglycerides: 72 mg/dL (ref 0.0–149.0)
VLDL: 14.4 mg/dL (ref 0.0–40.0)

## 2023-11-17 LAB — PROTIME-INR
INR: 1.2 {ratio} — ABNORMAL HIGH (ref 0.8–1.0)
Prothrombin Time: 12.6 s (ref 9.6–13.1)

## 2023-11-17 LAB — HEMOGLOBIN A1C: Hgb A1c MFr Bld: 5.9 % (ref 4.6–6.5)

## 2023-11-17 LAB — PSA: PSA: 1.04 ng/mL (ref 0.10–4.00)

## 2023-11-17 LAB — GAMMA GT: GGT: 21 U/L (ref 7–51)

## 2023-11-17 LAB — TSH: TSH: 2.44 u[IU]/mL (ref 0.35–5.50)

## 2023-11-17 NOTE — Patient Instructions (Signed)

## 2023-11-18 ENCOUNTER — Encounter: Payer: Self-pay | Admitting: Internal Medicine

## 2023-11-18 MED ORDER — SERTRALINE HCL 50 MG PO TABS: 50.0000 mg | ORAL_TABLET | Freq: Two times a day (BID) | ORAL | 1 refills | Status: DC

## 2023-11-18 MED ORDER — URSODIOL 250 MG PO TABS: 1000.0000 mg | ORAL_TABLET | Freq: Every day | ORAL | 1 refills | Status: DC

## 2023-11-18 MED ORDER — LEVOTHYROXINE SODIUM 112 MCG PO TABS: 112.0000 ug | ORAL_TABLET | Freq: Every day | ORAL | 1 refills | Status: DC

## 2023-11-18 MED ORDER — ATORVASTATIN CALCIUM 20 MG PO TABS: 20.0000 mg | ORAL_TABLET | Freq: Every day | ORAL | 1 refills | Status: DC

## 2023-11-25 ENCOUNTER — Encounter: Payer: Medicare Other | Admitting: Internal Medicine

## 2023-12-21 ENCOUNTER — Other Ambulatory Visit: Payer: Self-pay | Admitting: Internal Medicine

## 2023-12-21 DIAGNOSIS — K743 Primary biliary cirrhosis: Secondary | ICD-10-CM

## 2024-01-05 ENCOUNTER — Encounter: Payer: Self-pay | Admitting: Certified Registered Nurse Anesthetist

## 2024-01-05 NOTE — Progress Notes (Unsigned)
 [facility] History and Physical   Primary Care Physician:  Etta Grandchild, William Luna   Reason for Procedure:   Hxc colon polyps  Plan:    colonoscopy     HPI: William KASSABIAN, William Luna is a 73 y.o. male   Adenomas 2003 and 2015, maternal grandfather had CRCA Last colonoscopy 2019 - NL bxs (diarrhea) no polyps Past Medical History:  Diagnosis Date   Adenomatous colon polyp    2003   ANEMIA, IRON DEFICIENCY    mild in the past   BPH (benign prostatic hyperplasia)    Cataract    CERVICAL RADICULOPATHY    disectomy in 1990s   DDD (degenerative disc disease), lumbar oct '12   L4-5, ESI therapy successful 10/12; L5-S1 radiculopathy 3/13   Fundic gland polyps of stomach, benign    GERD    EGD negative x 3, off medication (July '13)   Hearing loss in left ear    HYPERLIPIDEMIA, MILD    lipitor 10mg     Hypothyroidism    Liver cyst    Primary biliary cholangitis (HCC) 09/18/2018   Renal cyst    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[facility])   mid-tongue squamous cell - excised, clean margins, 11 nodes negative, no adjuvant therapy   TRANSAMINASES, SERUM, ELEVATED     Past Surgical History:  Procedure Laterality Date   BACK SURGERY  '90s ([place])   CERVICAL DISC - diskectomy w/ fusion, anterior   CHOLECYSTECTOMY N/A 01/05/2021   Procedure: LAPAROSCOPIC CHOLECYSTECTOMY WITH INTRAOPERATIVE CHOLANGIOGRAM;  Surgeon: Luretha Murphy, William Luna;  [facility];  Service: General;  Laterality: N/A;   COLONOSCOPY     Multiple   ESOPHAGOGASTRODUODENOSCOPY     Multiple   HEMIGLOSSECTOMY  10/30/2011   Procedure: HEMIGLOSSECTOMY;  Surgeon: [name];  [facility];  Service: ENT;  Laterality: Right;  Right tongue resection   LUMBAR LAMINECTOMY/DECOMPRESSION MICRODISCECTOMY  01/29/2012   Procedure: LUMBAR LAMINECTOMY/DECOMPRESSION MICRODISCECTOMY;  Surgeon: Barnett Abu, William Luna;  [facility];  Service: Neurosurgery;  Laterality: Left;  Left L5-S1 Microdiskectomy    LUMBAR LAMINECTOMY/DECOMPRESSION MICRODISCECTOMY  07/13/2012   Procedure: LUMBAR LAMINECTOMY/DECOMPRESSION MICRODISCECTOMY 1 LEVEL;  Surgeon: Barnett Abu, William Luna;  [facility];  Service: Neurosurgery;  Laterality: Left;  Redo Left Lumbar five-sacral one Microdiskectomy   nail avulsion     right first finger due to a wart   RADICAL NECK DISSECTION  10/30/2011   Procedure: RADICAL NECK DISSECTION;  Surgeon: [name];  [facility];  Service: ENT;  Laterality: Right;    Prior to Admission medications   Medication Sig Start Date End Date Taking? Authorizing Provider  atorvastatin (LIPITOR) 20 MG tablet Take 1 tablet (20 mg total) by mouth daily. 11/18/23   Etta Grandchild, William Luna  cetirizine (ZYRTEC) 10 MG tablet Take 10 mg by mouth daily.    Provider, Historical, William Luna  levothyroxine (SYNTHROID) 112 MCG tablet Take 1 tablet (112 mcg total) by mouth daily. 11/18/23   Etta Grandchild, William Luna  polyethylene glycol powder (GLYCOLAX/MIRALAX) 17 GM/SCOOP powder Take 1 Container by mouth once.    Provider, Historical, William Luna  sertraline (ZOLOFT) 50 MG tablet Take 1 tablet (50 mg total) by mouth 2 (two) times daily. 11/18/23   Etta Grandchild, William Luna  ursodiol (ACTIGALL) 250 MG tablet TAKE FOUR TABLETS BY MOUTH DAILY 12/21/23   Etta Grandchild, William Luna  zaleplon (SONATA) 10 MG capsule Take 1 capsule (10 mg total) by mouth at bedtime as needed  for sleep. 09/04/22   Etta Grandchild, William Luna    Current Outpatient Medications  Medication Sig Dispense Refill   atorvastatin (LIPITOR) 20 MG tablet Take 1 tablet (20 mg total) by mouth daily. 90 tablet 1   cetirizine (ZYRTEC) 10 MG tablet Take 10 mg by mouth daily.     levothyroxine (SYNTHROID) 112 MCG tablet Take 1 tablet (112 mcg total) by mouth daily. 90 tablet 1   sertraline (ZOLOFT) 50 MG tablet Take 1 tablet (50 mg total) by mouth 2 (two) times daily. 180 tablet 1   ursodiol (ACTIGALL) 250 MG tablet TAKE FOUR TABLETS BY MOUTH DAILY 360 tablet 1   polyethylene glycol  powder (GLYCOLAX/MIRALAX) 17 GM/SCOOP powder Take 1 Container by mouth once.     zaleplon (SONATA) 10 MG capsule Take 1 capsule (10 mg total) by mouth at bedtime as needed for sleep. 50 capsule 1   No current facility-administered medications for this visit.    Allergies as of 01/06/2024   (No Known Allergies)    Family History  Problem Relation Age of Onset   Diabetes Mother    Anemia Mother    Cirrhosis Mother 45       primary biliary cirrhosis   Hyperlipidemia Father    Hypertension Father    Heart disease Father    Colon cancer Maternal Grandfather    COPD Neg Hx    Colon polyps Neg Hx    Esophageal cancer Neg Hx    Stomach cancer Neg Hx    Rectal cancer Neg Hx     Social History   Socioeconomic History   Marital status: Married    Spouse name: [name]   Number of children: 2   Years of education: 51   Highest education level: Professional school degree (e.g., William Luna, DDS, DVM, JD)  Occupational History   Occupation: Film/video editor: [employer]  Tobacco Use   Smoking status: Never   Smokeless tobacco: Never  Vaping Use   Vaping status: Never Used  Substance and Sexual Activity   Alcohol use: Yes    Alcohol/week: 4.0 standard drinks of alcohol    Types: 4 Cans of beer per week    Comment: OCC.   Drug use: No   Sexual activity: Yes    Partners: Female    Birth control/protection: None  Other Topics Concern   Not on file  Social History Narrative   [employer] [ [place];; scholar}; [place] Apley,   [employer] residency/fellowship.    Married- 1977 - 25 years/divorced;  Married 2006.    1 son  b1980, 1 daughter - [phone]; Doneen Poisson [phone],  Step-daughter [phone].    Grandchildren - 2 (b'2010, 2013).    Work - Building surveyor. Retired 2016   ACP/Living will:  Wife  with healthcare POA; Yes - CPR, Yes - short-term mechanical ventilation,  no futile care.   Patient no alcohol no tobacco or drug use   Social Drivers of Corporate investment banker  Strain: Low Risk  (11/14/2023)   Overall Financial Resource Strain (CARDIA)    Difficulty of Paying Living Expenses: Not hard at all  Food Insecurity: No Food Insecurity (11/14/2023)   Hunger Vital Sign    Worried About Running Out of Food in the Last Year: Never true    Ran Out of Food in the Last Year: Never true  Transportation Needs: No Transportation Needs (11/14/2023)   PRAPARE - Administrator, Civil Service (Medical): No  Lack of Transportation (Non-Medical): No  Physical Activity: Sufficiently Active (11/14/2023)   Exercise Vital Sign    Days of Exercise per Week: 5 days    Minutes of Exercise per Session: 30 min  Stress: No Stress Concern Present (11/14/2023)   [employer] of Occupational Health - Occupational Stress Questionnaire    Feeling of Stress : Not at all  Social Connections: Socially Integrated (11/14/2023)   Social Connection and Isolation Panel [NHANES]    Frequency of Communication with Friends and Family: More than three times a week    Frequency of Social Gatherings with Friends and Family: Once a week    Attends Religious Services: 1 to 4 times per year    Active Member of [employer] or Organizations: Yes    Attends Engineer, structural: More than 4 times per year    Marital Status: Married  Catering manager Violence: Not At Risk (10/31/2021)   Humiliation, Afraid, Rape, and Kick questionnaire    Fear of Current or Ex-Partner: No    Emotionally Abused: No    Physically Abused: No    Sexually Abused: No    Review of Systems:  All other review of systems negative except as mentioned in the HPI.  Physical Exam: Vital signs BP 113/70   Pulse 60   Resp 18   SpO2 100%   General:   Alert,  Well-developed, well-nourished, pleasant and cooperative in NAD Lungs:  Clear throughout to auscultation.   Heart:  Regular rate and rhythm; no murmurs, clicks, rubs,  or gallops. Abdomen:  Soft, nontender and nondistended. Normal bowel  sounds.   Neuro/Psych:  Alert and cooperative. Normal mood and affect. A and O x 3   @William Luna  Sena Slate, William Luna, [facility] [phone] (pager) 01/06/2024 10:51 AM@

## 2024-01-06 ENCOUNTER — Ambulatory Visit (AMBULATORY_SURGERY_CENTER): Payer: Medicare Other | Admitting: Internal Medicine

## 2024-01-06 ENCOUNTER — Encounter: Payer: Self-pay | Admitting: Internal Medicine

## 2024-01-06 VITALS — BP 116/63 | HR 55 | Resp 15

## 2024-01-06 DIAGNOSIS — Z1211 Encounter for screening for malignant neoplasm of colon: Secondary | ICD-10-CM | POA: Diagnosis not present

## 2024-01-06 DIAGNOSIS — Z860100 Personal history of colon polyps, unspecified: Secondary | ICD-10-CM

## 2024-01-06 DIAGNOSIS — N400 Enlarged prostate without lower urinary tract symptoms: Secondary | ICD-10-CM | POA: Diagnosis not present

## 2024-01-06 DIAGNOSIS — E039 Hypothyroidism, unspecified: Secondary | ICD-10-CM | POA: Diagnosis not present

## 2024-01-06 DIAGNOSIS — Z800 Family history of malignant neoplasm of digestive organs: Secondary | ICD-10-CM | POA: Diagnosis not present

## 2024-01-06 DIAGNOSIS — K648 Other hemorrhoids: Secondary | ICD-10-CM | POA: Diagnosis not present

## 2024-01-06 DIAGNOSIS — Z860101 Personal history of adenomatous and serrated colon polyps: Secondary | ICD-10-CM | POA: Diagnosis not present

## 2024-01-06 NOTE — Progress Notes (Signed)
 Pt's states no medical or surgical changes since previsit or office visit.

## 2024-01-06 NOTE — Progress Notes (Signed)
 Report given to PACU, vss

## 2024-01-06 NOTE — Patient Instructions (Addendum)
 No polyps or cancer were seen. Internal hemorrhoids were prominent which occurs after a prep sometimes.  I am not recommending a routine repeat colonoscopy based upon your history, age and results today.  I appreciate the opportunity to care for you. Iva Boop, MD, FACG  YOU HAD AN ENDOSCOPIC PROCEDURE TODAY AT THE [facility]:   Refer to the procedure report that was given to you for any specific questions about what was found during the examination.  If the procedure report does not answer your questions, please call your gastroenterologist to clarify.  If you requested that your care partner not be given the details of your procedure findings, then the procedure report has been included in a sealed envelope for you to review at your convenience later.  YOU SHOULD EXPECT: Some feelings of bloating in the abdomen. Passage of more gas than usual.  Walking can help get rid of the air that was put into your GI tract during the procedure and reduce the bloating. If you had a lower endoscopy (such as a colonoscopy or flexible sigmoidoscopy) you may notice spotting of blood in your stool or on the toilet paper. If you underwent a bowel prep for your procedure, you may not have a normal bowel movement for a few days.  Please Note:  You might notice some irritation and congestion in your nose or some drainage.  This is from the oxygen used during your procedure.  There is no need for concern and it should clear up in a day or so.  SYMPTOMS TO REPORT IMMEDIATELY:  Following lower endoscopy (colonoscopy or flexible sigmoidoscopy):  Excessive amounts of blood in the stool  Significant tenderness or worsening of abdominal pains  Swelling of the abdomen that is new, acute  Fever of 100F or higher  For urgent or emergent issues, a gastroenterologist can be reached at any hour by calling [phone]. Do not use MyChart messaging for urgent concerns.    DIET:  We do recommend a  small meal at first, but then you may proceed to your regular diet.  Drink plenty of fluids but you should avoid alcoholic beverages for 24 hours.  ACTIVITY:  You should plan to take it easy for the rest of today and you should NOT DRIVE or use heavy machinery until tomorrow (because of the sedation medicines used during the test).    FOLLOW UP: Our staff will call the number listed on your records the next business day following your procedure.  We will call around 7:15- 8:00 am to check on you and address any questions or concerns that you may have regarding the information given to you following your procedure. If we do not reach you, we will leave a message.     If any biopsies were taken you will be contacted by phone or by letter within the next 1-3 weeks.  Please call us at [phone] if you have not heard about the biopsies in 3 weeks.    SIGNATURES/CONFIDENTIALITY: You and/or your care partner have signed paperwork which will be entered into your electronic medical record.  These signatures attest to the fact that that the information above on your After Visit Summary has been reviewed and is understood.  Full responsibility of the confidentiality of this discharge information lies with you and/or your care-partner.

## 2024-01-06 NOTE — Op Note (Signed)
 [facility] Patient Name: William Luna Procedure Date: 01/06/2024 10:18 AM MRN: [id number] Endoscopist: Iva Boop , MD, [id number] Age: 73 Referring MD:  Date of Birth: April 19, 1951 Gender: Male Account #: [id number] Procedure:                Colonoscopy Indications:              Surveillance: Personal history of adenomatous                            polyps on last colonoscopy > 5 years ago, Last                            colonoscopy: 2019 Medicines:                Monitored Anesthesia Care Procedure:                Pre-Anesthesia Assessment:                           - Prior to the procedure, a History and Physical                            was performed, and patient medications and                            allergies were reviewed. The patient's tolerance of                            previous anesthesia was also reviewed. The risks                            and benefits of the procedure and the sedation                            options and risks were discussed with the patient.                            All questions were answered, and informed consent                            was obtained. Prior Anticoagulants: The patient has                            taken no anticoagulant or antiplatelet agents. ASA                            Grade Assessment: II - A patient with mild systemic                            disease. After reviewing the risks and benefits,                            the patient was deemed in satisfactory condition to  undergo the procedure.                           After obtaining informed consent, the colonoscope                            was passed under direct vision. Throughout the                            procedure, the patient's blood pressure, pulse, and                            oxygen saturations were monitored continuously. The                            Olympus Scope [id number] was introduced through  the                            anus and advanced to the the cecum, identified by                            appendiceal orifice and ileocecal valve. The                            colonoscopy was performed without difficulty. The                            patient tolerated the procedure well. The quality                            of the bowel preparation was good. The ileocecal                            valve, appendiceal orifice, and rectum were                            photographed. The bowel preparation used was SUPREP                            via split dose instruction. Scope In: 10:34:44 AM Scope Out: 10:47:43 AM Scope Withdrawal Time: 0 hours 9 minutes 38 seconds  Total Procedure Duration: 0 hours 12 minutes 59 seconds  Findings:                 The digital rectal exam findings include enlarged                            prostate. Pertinent negatives include no palpable                            rectal lesions.                           Internal hemorrhoids were found.  The exam was otherwise without abnormality on                            direct and retroflexion views. Complications:            No immediate complications. Estimated Blood Loss:     Estimated blood loss: none. Impression:               - Enlarged prostate found on digital rectal exam.                           - Internal hemorrhoids.                           - The examination was otherwise normal on direct                            and retroflexion views.                           - No specimens collected.                           - Personal history of colonic polyps. Adenomas 2003                            and 2015, maternal grandfather had CRCA                           Last colonoscopy 2019 - NL bxs (diarrhea) no polyps Recommendation:           - Patient has a contact number available for                            emergencies. The signs and symptoms of potential                             delayed complications were discussed with the                            patient. Return to normal activities tomorrow.                            Written discharge instructions were provided to the                            patient.                           - Resume previous diet.                           - Continue present medications.                           - No repeat colonoscopy due to current age (82  years or older) and the absence of colonic polyps. Iva Boop, MD 01/06/2024 10:57:37 AM This report has been signed electronically.

## 2024-01-07 ENCOUNTER — Telehealth: Payer: Self-pay | Admitting: *Deleted

## 2024-01-07 NOTE — Telephone Encounter (Signed)
 No answer on  follow up call. Left message.

## 2024-02-26 LAB — PSA
A1c: 5.7
EGFR: 81
PSA: 0.9
TSH: 3.82 (ref 0.41–5.90)

## 2024-03-24 DIAGNOSIS — H35373 Puckering of macula, bilateral: Secondary | ICD-10-CM | POA: Diagnosis not present

## 2024-03-25 DIAGNOSIS — H903 Sensorineural hearing loss, bilateral: Secondary | ICD-10-CM | POA: Diagnosis not present

## 2024-04-05 DIAGNOSIS — K743 Primary biliary cirrhosis: Secondary | ICD-10-CM | POA: Diagnosis not present

## 2024-04-05 DIAGNOSIS — K754 Autoimmune hepatitis: Secondary | ICD-10-CM | POA: Diagnosis not present

## 2024-05-04 ENCOUNTER — Ambulatory Visit (INDEPENDENT_AMBULATORY_CARE_PROVIDER_SITE_OTHER)

## 2024-05-04 VITALS — Ht 68.0 in | Wt 147.0 lb

## 2024-05-04 DIAGNOSIS — Z0000 Encounter for general adult medical examination without abnormal findings: Secondary | ICD-10-CM

## 2024-05-04 NOTE — Patient Instructions (Signed)
 William Luna , Thank you for taking time out of your busy schedule to complete your Annual Wellness Visit with me. I enjoyed our conversation and look forward to speaking with you again next year. I, as well as your care team,  appreciate your ongoing commitment to your health goals. Please review the following plan we discussed and let me know if I can assist you in the future. Your Game plan/ To Do List   Follow up Visits: Next Medicare AWV with our clinical staff: 05/06/2025.   Have you seen your provider in the last 6 months (3 months if uncontrolled diabetes)? Yes Next Office Visit with your provider: 05/17/2024.  Clinician Recommendations:  Aim for 30 minutes of exercise or brisk walking, 6-8 glasses of water, and 5 servings of fruits and vegetables each day. Keep up the good work.      This is a list of the screening recommended for you and due dates:  Health Maintenance  Topic Date Due   Medicare Annual Wellness Visit  11/06/2023   COVID-19 Vaccine (8 - Pfizer risk 2024-25 season) 01/24/2024   Flu Shot  06/18/2024   DTaP/Tdap/Td vaccine (4 - Td or Tdap) 10/05/2031   Pneumococcal Vaccine for age over 67  Completed   Hepatitis C Screening  Completed   Zoster (Shingles) Vaccine  Completed   HPV Vaccine  Aged Out   Meningitis B Vaccine  Aged Out   Colon Cancer Screening  Discontinued    Advanced directives: (Copy Requested) Please bring a copy of your health care power of attorney and living will to the office to be added to your chart at your convenience. You can mail to [facility] [address] [place], [place] [address] or email to [email or web address] Advance Care Planning is important because it:  [x]  Makes sure you receive the medical care that is consistent with your values, goals, and preferences  [x]  It provides guidance to your family and loved ones and reduces their decisional burden about whether or not they are making the right decisions based on  your wishes.  Follow the link provided in your after visit summary or read over the paperwork we have mailed to you to help you started getting your Advance Directives in place. If you need assistance in completing these, please reach out to us  so that we can help you!  See attachments for Preventive Care and Fall Prevention Tips.

## 2024-05-04 NOTE — Progress Notes (Signed)
 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  As a reminder, Annual Wellness Visits don't include a physical exam, and some assessments may be limited, especially if this visit is performed virtually. We may recommend an in-person follow-up visit with your provider if needed.  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  Patient Location: Home  Provider Location: Home Office  I discussed the limitations of evaluation and management by telemedicine. The patient expressed understanding and agreed to proceed.  Vital Signs: Because this visit was a virtual/telehealth visit, some criteria may be missing or patient reported. Any vitals not documented were not able to be obtained and vitals that have been documented are patient reported.  Persons Participating in Visit: Patient.  AWV Questionnaire: Yes: Patient Medicare AWV questionnaire was completed by the patient on 05/01/2024; I have confirmed that all information answered by patient is correct and no changes since this date.  Cardiac Risk Factors include: male gender;advanced age (>1men, >50 women);Other (see comment);dyslipidemia     Objective:    Today's Vitals   05/04/24 0920  Weight: 147 lb (66.7 kg)  Height: 5' 8 (1.727 m)   Body mass index is 22.35 kg/m.     05/04/2024    9:29 AM 10/31/2021   11:48 AM 12/26/2020   10:24 AM 09/06/2020    9:20 AM 09/17/2018    6:15 AM 01/06/2018    4:46 PM 06/19/2017   10:24 AM  Advanced Directives  Does Patient Have a Medical Advance Directive? Yes Yes Yes Yes Yes  Yes  No   Type of Estate agent of [place];Living will Living will;Healthcare Power of [employer] Power of [place];Living will Healthcare Power of [place];Living will Healthcare  Power of [place];Living will Healthcare Power of [place];Living will   Does patient want to make changes to medical advance directive?  No - Patient declined  No - Patient declined No - Patient declined     Copy of Healthcare Power of Attorney in Chart? No - copy requested No - copy requested  No - copy requested No - copy requested  No - copy requested       Data saved with a previous flowsheet row definition    Current Medications (verified) Outpatient Encounter Medications as of 05/04/2024  Medication Sig   atorvastatin  (LIPITOR) 20 MG tablet Take 1 tablet (20 mg total) by mouth daily.   cetirizine (ZYRTEC) 10 MG tablet Take 10 mg by mouth daily.   levothyroxine  (SYNTHROID ) 112 MCG tablet Take 1 tablet (112 mcg total) by mouth daily.   polyethylene glycol powder (GLYCOLAX/MIRALAX) 17 GM/SCOOP powder Take 1 Container by mouth once.   sertraline  (ZOLOFT ) 50 MG tablet Take 1 tablet (50 mg total) by mouth 2 (two) times daily.   sildenafil (VIAGRA) 100 MG tablet Take 100 mg by mouth.   ursodiol  (ACTIGALL ) 250 MG tablet TAKE FOUR TABLETS BY MOUTH DAILY   zaleplon  (SONATA ) 10 MG capsule Take 1 capsule (10 mg total) by mouth at bedtime as needed for sleep.   No facility-administered encounter medications on file as of 05/04/2024.    Allergies (verified) Patient has no known allergies.   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[facility])   mid-tongue squamous cell - excised, clean margins, 11 nodes negative, no adjuvant therapy   TRANSAMINASES, SERUM, ELEVATED    Past Surgical History:  Procedure Laterality Date   BACK SURGERY  '90s ([place])   CERVICAL DISC - diskectomy w/ fusion, anterior   CHOLECYSTECTOMY N/A 01/05/2021   Procedure: LAPAROSCOPIC CHOLECYSTECTOMY WITH INTRAOPERATIVE CHOLANGIOGRAM;  Surgeon: Jacolyn Matar, MD;  Location: [facility];  Service: General;  Laterality: N/A;   COLONOSCOPY     Multiple   ESOPHAGOGASTRODUODENOSCOPY     Multiple   HEMIGLOSSECTOMY  10/30/2011   Procedure: HEMIGLOSSECTOMY;  Surgeon: Elois Hair;  Location: [facility];  Service: ENT;  Laterality: Right;  Right tongue resection   LUMBAR LAMINECTOMY/DECOMPRESSION MICRODISCECTOMY  01/29/2012   Procedure: LUMBAR LAMINECTOMY/DECOMPRESSION MICRODISCECTOMY;  Surgeon: Elna Haggis, MD;  Location: [facility];  Service: Neurosurgery;  Laterality: Left;  Left L5-S1 Microdiskectomy   LUMBAR LAMINECTOMY/DECOMPRESSION MICRODISCECTOMY  07/13/2012   Procedure: LUMBAR LAMINECTOMY/DECOMPRESSION MICRODISCECTOMY 1 LEVEL;  Surgeon: Elna Haggis, MD;  Location: [facility];  Service: Neurosurgery;  Laterality: Left;  Redo Left Lumbar five-sacral one Microdiskectomy   nail avulsion     right first finger due to a wart   RADICAL NECK DISSECTION  10/30/2011   Procedure: RADICAL NECK DISSECTION;  Surgeon: Elois Hair;  Location: [facility];  Service: ENT;  Laterality: Right;   Family History  Problem Relation Age of Onset   Diabetes Mother    Anemia Mother    Cirrhosis Mother 37       primary biliary cirrhosis   Hyperlipidemia Father    Hypertension Father    Heart disease Father    Colon cancer Maternal Grandfather    COPD Neg Hx    Colon polyps Neg Hx    Esophageal cancer Neg Hx    Stomach cancer Neg Hx    Rectal cancer Neg Hx    Social History   Socioeconomic History   Marital status: Married     Spouse name: [name]   Number of children: 2   Years of education: 61   Highest education level: Professional school degree (e.g., MD, DDS, DVM, JD)  Occupational History   Occupation: RETIRED/Cardiologist    Employer:   Tobacco Use   Smoking status: Never   Smokeless tobacco: Never  Vaping Use   Vaping status: Never Used  Substance and Sexual Activity   Alcohol use: Yes    Alcohol/week: 4.0 standard drinks of alcohol    Types: 4 Cans of beer per week    Comment: OCC.   Drug use: No   Sexual activity: Yes    Partners: Female    Birth control/protection: None  Other Topics Concern   Not on file  Social History Narrative   [employer] [ [place];; scholar}; [employer] -MD,   [facility]  residency/fellowship.    Married- 1977 - 25 years/divorced;  Married 2006.    1 son  b1980, 1 daughter - [phone]; Elwyn Hamper [phone],  Step-daughter [phone].    Grandchildren - 2 (b'2010, 2013).    Work - Building surveyor. Retired 2016   ACP/Living will:  Wife  with healthcare POA; Yes - CPR, Yes - short-term mechanical ventilation,  no futile care.   Patient no alcohol no tobacco or drug use      Lives with wife/2025   Social Drivers of Health   Financial Resource Strain: Low Risk  (05/01/2024)   Overall Financial Resource Strain (CARDIA)    Difficulty of Paying Living Expenses: Not hard at all  Food Insecurity: No Food Insecurity (05/01/2024)   Hunger Vital Sign    Worried About Running Out of Food in the Last Year: Never true    Ran Out of Food in the Last Year: Never true  Transportation Needs: No Transportation Needs (05/01/2024)   PRAPARE - Administrator, Civil Service (Medical): No    Lack of Transportation (Non-Medical): No  Physical Activity: Sufficiently Active (05/01/2024)   Exercise Vital Sign    Days of Exercise per Week: 5 days    Minutes of Exercise per Session: 30 min  Stress: No Stress Concern Present (05/01/2024)   [employer] of Occupational  Health - Occupational Stress Questionnaire    Feeling of Stress: Not at all  Social Connections: Socially Integrated (05/01/2024)   Social Connection and Isolation Panel    Frequency of Communication with Friends and Family: More than three times a week    Frequency of Social Gatherings with Friends and Family: Twice a week    Attends Religious Services: 1 to 4 times per year    Active Member of [employer] or Organizations: Yes    Attends Engineer, structural: More than 4 times per year    Marital Status: Married    Tobacco Counseling Counseling given: Not Answered    Clinical Intake:  Pre-visit preparation completed: Yes  Pain : No/denies pain     BMI - recorded: 22.35 Nutritional Status: BMI of 19-24  Normal Nutritional Risks: None Diabetes: No  Lab Results  Component Value Date   HGBA1C 5.9 11/17/2023   HGBA1C 6.0 05/15/2023   HGBA1C 6.2 09/05/2022     How often do you need to have someone help you when you read instructions, pamphlets, or other written materials from your doctor or pharmacy?: 1 - Never  Interpreter Needed?: No  Information entered by :: Debbi Strandberg, RMA   Activities of Daily Living     05/01/2024   10:35 AM  In your present state of health, do you have any difficulty performing the following activities:  Hearing? 1  Comment wears hearing aides  Vision? 0  Difficulty concentrating or making decisions? 0  Walking or climbing stairs? 0  Dressing or bathing? 0  Doing errands, shopping? 0  Preparing Food and eating ? N  Using the Toilet? N  In the past six months, have you accidently leaked urine? N  Do you have problems with loss of bowel control? N  Managing your Medications? N  Managing your Finances? N  Housekeeping or managing your Housekeeping? N    Patient Care Team: Arcadio Knuckles, MD as PCP - General (Internal Medicine) Devon Fogo, MD (Inactive) (Dermatology) Marlyne Sing, MD (Hematology and Oncology) Elna Haggis,  MD (Neurosurgery) Vernadine Golas, MD (Otolaryngology) Ryland Cozier, MD [facility],  Delaney Fearing, RPH (Inactive) as Pharmacist (Pharmacist) Nelva Bang, OD as Referring Physician (Optometry) Manette Section, MD as Referring Physician (Transplant Hepatology) Kenney Peacemaker, MD as Consulting Physician (Gastroenterology) Dayne Even, MD as Consulting Physician (Orthopedic Surgery)  I have updated your Care Teams any recent Medical Services you may have received from other providers in the past year.     Assessment:   This is a routine wellness examination for William Luna.  Hearing/Vision screen Hearing Screening - Comments:: Hearing aides  Vision Screening - Comments:: Wears eyeglasses for reading/Dr. Scott/[facility]   Goals Addressed             This Visit's Progress    Patient Stated   On track    I would like to eat healthier by planning my meals better at home and monitoring my carbohydrate intake.  Continue to exercise routinely, enjoy doing my home work projects. Continue to travel and ski.        Depression Screen     05/04/2024    9:31 AM 11/17/2023    9:41 AM 11/05/2022   10:57 AM 10/31/2021   11:47 AM 10/04/2021   11:12 AM 07/20/2020    8:15 AM 07/19/2019    2:03 PM  PHQ 2/9 Scores  PHQ - 2 Score 0 0 0 0 0 0 0  PHQ- 9 Score 0      0    Fall Risk     05/01/2024   10:35 AM 11/17/2023    9:41 AM 11/05/2022   10:57 AM 10/31/2022   11:42 AM 10/04/2021   11:12 AM  Fall Risk   Falls in the past year? 0 0 0 0 0  Number falls in past yr:  0 0 0 0  Injury with Fall?  0 0 0 0  Risk for fall due to :  No Fall Risks No Fall Risks  No Fall Risks  Follow up Falls evaluation completed;Falls prevention discussed Falls evaluation completed Falls evaluation completed   Falls evaluation completed      Data saved with a previous flowsheet row definition    MEDICARE RISK AT HOME:  Medicare Risk at Home Any stairs in or around the home?: (Patient-Rptd) Yes If so, are there  any without handrails?: (Patient-Rptd) No Home free of loose throw rugs in walkways, pet beds, electrical cords, etc?: (Patient-Rptd) Yes Adequate lighting in your home to reduce risk of falls?: (Patient-Rptd) Yes Life alert?: (Patient-Rptd) No Use of a cane, walker or w/c?: (Patient-Rptd) No Shower chair or bench in shower?: (Patient-Rptd) Yes Elevated toilet seat or a handicapped toilet?: (Patient-Rptd) No  TIMED UP AND GO:  Was the test performed?  No  Cognitive Function: Declined/Normal: No cognitive concerns noted by patient or family. Patient alert, oriented, able to answer questions appropriately and recall recent events. No signs of memory loss or confusion.    01/06/2018    4:49 PM  MMSE - Mini Mental State Exam  Not completed: Refused        Immunizations Immunization History  Administered Date(s) Administered   Fluad Quad(high Dose 65+) 07/19/2019, 08/05/2021   Hepatitis A 11/13/2016   Hepatitis A, Adult 04/19/2016   Hepatitis B 04/10/1989, 05/14/1989, 10/22/1989   Influenza, High Dose Seasonal PF 08/07/2022, 07/27/2023   Influenza,inj,Quad PF,6+ Mos 08/26/2011, 08/26/2012, 08/05/2013, 08/04/2014, 07/19/2017, 07/20/2020   Influenza-Unspecified 08/26/2011, 08/19/2015, 08/15/2016, 07/19/2018   PFIZER Comirnaty(Gray Top)Covid-19 Tri-Sucrose Vaccine 08/01/2022   PFIZER(Purple Top)SARS-COV-2 Vaccination 12/01/2019, 12/21/2019, 08/16/2020, 04/30/2021, 08/06/2022   PPD Test  02/19/2005, 03/04/2006, 03/05/2007, 04/15/2008, 02/20/2009, 05/03/2010, 05/07/2010   Pfizer Covid-19 Vaccine Bivalent Booster 102yrs & up 08/05/2021   Pfizer(Comirnaty)Fall Seasonal Vaccine 12 years and older 07/27/2023   Pneumococcal Conjugate-13 10/09/2015, 11/21/2015   Pneumococcal Polysaccharide-23 01/06/2018   Respiratory Syncytial Virus Vaccine,Recomb Aduvanted(Arexvy) 07/27/2023   Td 11/18/1998   Tdap 07/24/2012, 10/04/2021   Typhoid Live 04/19/2016   Zoster Recombinant(Shingrix ) 08/18/2018,  11/04/2018   Zoster, Live 10/20/2012    Screening Tests Health Maintenance  Topic Date Due   Medicare Annual Wellness (AWV)  11/06/2023   COVID-19 Vaccine (8 - Pfizer risk 2024-25 season) 01/24/2024   INFLUENZA VACCINE  06/18/2024   DTaP/Tdap/Td (4 - Td or Tdap) 10/05/2031   Pneumococcal Vaccine: 50+ Years  Completed   Hepatitis C Screening  Completed   Zoster Vaccines- Shingrix   Completed   HPV VACCINES  Aged Out   Meningococcal B Vaccine  Aged Out   Colonoscopy  Discontinued    Health Maintenance  Health Maintenance Due  Topic Date Due   Medicare Annual Wellness (AWV)  11/06/2023   COVID-19 Vaccine (8 - Pfizer risk 2024-25 season) 01/24/2024   Health Maintenance Items Addressed: See Nurse Notes at the end of this note  Additional Screening:  Vision Screening: Recommended annual ophthalmology exams for early detection of glaucoma and other disorders of the eye. Would you like a referral to an eye doctor? No    Dental Screening: Recommended annual dental exams for proper oral hygiene  Community Resource Referral / Chronic Care Management: CRR required this visit?  No   CCM required this visit?  No   Plan:    I have personally reviewed and noted the following in the patient's chart:   Medical and social history Use of alcohol, tobacco or illicit drugs  Current medications and supplements including opioid prescriptions. Patient is not currently taking opioid prescriptions. Functional ability and status Nutritional status Physical activity Advanced directives List of other physicians Hospitalizations, surgeries, and ER visits in previous 12 months Vitals Screenings to include cognitive, depression, and falls Referrals and appointments  In addition, I have reviewed and discussed with patient certain preventive protocols, quality metrics, and best practice recommendations. A written personalized care plan for preventive services as well as general preventive  health recommendations were provided to patient.   Kang Ishida L Shenee Wignall, [name]   05/04/2024   After Visit Summary: (MyChart) Due to this being a telephonic visit, the after visit summary with patients personalized plan was offered to patient via MyChart   Notes: Nothing significant to report at this time.

## 2024-05-13 ENCOUNTER — Other Ambulatory Visit: Payer: Self-pay | Admitting: Internal Medicine

## 2024-05-13 DIAGNOSIS — E785 Hyperlipidemia, unspecified: Secondary | ICD-10-CM

## 2024-05-13 DIAGNOSIS — R4681 Obsessive-compulsive behavior: Secondary | ICD-10-CM

## 2024-05-17 ENCOUNTER — Ambulatory Visit (INDEPENDENT_AMBULATORY_CARE_PROVIDER_SITE_OTHER): Payer: Medicare Other | Admitting: Internal Medicine

## 2024-05-17 ENCOUNTER — Encounter: Payer: Self-pay | Admitting: Internal Medicine

## 2024-05-17 VITALS — BP 126/64 | HR 72 | Temp 98.1°F | Resp 16 | Ht 68.0 in | Wt 148.8 lb

## 2024-05-17 DIAGNOSIS — R7303 Prediabetes: Secondary | ICD-10-CM

## 2024-05-17 DIAGNOSIS — Z136 Encounter for screening for cardiovascular disorders: Secondary | ICD-10-CM | POA: Diagnosis not present

## 2024-05-17 DIAGNOSIS — K743 Primary biliary cirrhosis: Secondary | ICD-10-CM | POA: Diagnosis not present

## 2024-05-17 DIAGNOSIS — K2100 Gastro-esophageal reflux disease with esophagitis, without bleeding: Secondary | ICD-10-CM

## 2024-05-17 DIAGNOSIS — E034 Atrophy of thyroid (acquired): Secondary | ICD-10-CM | POA: Diagnosis not present

## 2024-05-17 DIAGNOSIS — N182 Chronic kidney disease, stage 2 (mild): Secondary | ICD-10-CM

## 2024-05-17 DIAGNOSIS — E785 Hyperlipidemia, unspecified: Secondary | ICD-10-CM | POA: Diagnosis not present

## 2024-05-17 DIAGNOSIS — I83811 Varicose veins of right lower extremities with pain: Secondary | ICD-10-CM | POA: Diagnosis not present

## 2024-05-17 NOTE — Patient Instructions (Signed)

## 2024-05-17 NOTE — Progress Notes (Unsigned)
 Subjective:  Patient ID: William JONETTA Forget, MD, male    DOB: [birth year]  Age: 73 y.o. MRN: [id number]  CC: Rash and Hypothyroidism   HPI William JONETTA Forget, MD presents for f/up -----  Discussed the use of AI scribe software for clinical note transcription with the patient, who gave verbal consent to proceed.  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  He recently experienced exposure to poison ivy, likely while working in his yard. The rash initially localized but progressively worsened, causing significant discomfort by Friday evening. He self-initiated a prednisone  taper starting at 60 mg and tapering down to 10 mg, which has been effective in resolving the rash.  He has a history of primary biliary cholangitis and had a virtual follow-up with his hepatologist in May. Two years ago, an MRI elastography showed a small cyst, which was followed up last year and noted to be smaller.  He had a routine colonoscopy in February, which was normal. He had COVID-19 in January during a cardiology meeting, which led to isolation in his hotel room. His last COVID vaccination was in September 2024.  His subscapularis shoulder pain, which was previously bothersome and affected his sleep, resolved spontaneously about a week ago.  He remains very active, engaging in heavy work without experiencing palpitations, dizziness, or lightheadedness. No current subscapularis shoulder pain.       Outpatient Medications Prior to Visit  Medication Sig Dispense Refill   atorvastatin  (LIPITOR) 20 MG tablet TAKE 1 TABLET DAILY 90 tablet 3   cetirizine (ZYRTEC) 10 MG tablet Take 10 mg by mouth daily.     levothyroxine  (SYNTHROID ) 112 MCG tablet Take 1 tablet (112 mcg total) by mouth daily. 90 tablet 1   polyethylene glycol powder (GLYCOLAX/MIRALAX) 17 GM/SCOOP powder Take 1 Container by mouth once.     sertraline   (ZOLOFT ) 50 MG tablet TAKE 1 TABLET TWICE A DAY 180 tablet 3   ursodiol  (ACTIGALL ) 250 MG tablet TAKE FOUR TABLETS BY MOUTH DAILY 360 tablet 1   zaleplon  (SONATA ) 10 MG capsule Take 1 capsule (10 mg total) by mouth at bedtime as needed for sleep. 50 capsule 1   sildenafil (VIAGRA) 100 MG tablet Take 100 mg by mouth.     No facility-administered medications prior to visit.    ROS Review of Systems  Objective:  BP 126/64 (BP Location: Left Arm, Patient Position: Sitting, Cuff Size: Normal)   Pulse 72   Temp 98.1 F (36.7 C) (Oral)   Resp 16   Ht 5' 8 (1.727 m)   Wt 148 lb 12.8 oz (67.5 kg)   SpO2 98%   BMI 22.62 kg/m   BP Readings from Last 3 Encounters:  05/17/24 126/64  01/06/24 116/63  11/17/23 132/68    Wt Readings from Last 3 Encounters:  05/17/24 148 lb 12.8 oz (67.5 kg)  05/04/24 147 lb (66.7 kg)  11/17/23 147 lb 6.4 oz (66.9 kg)    Physical Exam Vitals reviewed.  HENT:     Mouth/Throat:     Mouth: Mucous membranes are moist.   Eyes:     General: No scleral icterus.    Conjunctiva/sclera: Conjunctivae normal.    Cardiovascular:     Rate and Rhythm: Normal rate and regular rhythm.     Heart sounds: No murmur heard.    No friction rub. No gallop.  Comments: EKG--- NSR, 68 bpm No LVH, Q waves, or ST/T wave changes  Pulmonary:     Effort: Pulmonary effort is normal.     Breath sounds: No stridor. No wheezing, rhonchi or rales.  Abdominal:     General: Abdomen is flat.     Palpations: There is no mass.     Tenderness: There is no abdominal tenderness. There is no guarding.     Hernia: No hernia is present.   Musculoskeletal:        General: No swelling or deformity. Normal range of motion.     Cervical back: Neck supple.     Right lower leg: No edema.     Left lower leg: No edema.  Lymphadenopathy:     Cervical: No cervical adenopathy.   Skin:    General: Skin is warm and dry.     Findings: Erythema and rash present.   Neurological:      General: No focal deficit present.     Mental Status: He is alert. Mental status is at baseline.   Psychiatric:        Mood and Affect: Mood normal.        Behavior: Behavior normal.     Lab Results  Component Value Date   WBC 5.8 11/17/2023   HGB 15.3 11/17/2023   HCT 45.5 11/17/2023   PLT 176.0 11/17/2023   GLUCOSE 85 11/17/2023   CHOL 135 11/17/2023   TRIG 72.0 11/17/2023   HDL 56.70 11/17/2023   LDLDIRECT 134.5 12/15/2006   LDLCALC 64 11/17/2023   ALT 29 11/17/2023   AST 43 (H) 11/17/2023   NA 140 11/17/2023   K 4.5 11/17/2023   CL 98 11/17/2023   CREATININE 1.05 11/17/2023   BUN 27 (H) 11/17/2023   CO2 31 11/17/2023   TSH 2.44 11/17/2023   PSA 1.04 11/17/2023   INR 1.2 (H) 11/17/2023   HGBA1C 5.9 11/17/2023    MR ABDOMEN WWO CONTRAST Result Date: 05/02/2023 CLINICAL DATA:  73 year old male with history of pancreatic cyst. History of squamous cell carcinoma of the tongue in 2012. EXAM: MRI ABDOMEN WITHOUT AND WITH CONTRAST TECHNIQUE: Multiplanar multisequence MR imaging of the abdomen was performed both before and after the administration of intravenous contrast. CONTRAST:  7 mL of Vueway . COMPARISON:  No prior abdominal MRI. FINDINGS: Lower chest: Unremarkable. Hepatobiliary: There are several small T1 hypointense, T2 hyperintense, nonenhancing lesions in the liver measuring up to 1.1 cm in diameter, compatible with tiny cysts and/or biliary hamartomas (benign, requiring no imaging follow-up). No other aggressive appearing hepatic lesions. Status post cholecystectomy. MRCP images demonstrate no intrahepatic biliary ductal dilatation. Common bile duct is slightly prominent measuring 7 mm, within normal limits for the patient's age and post cholecystectomy status. There are no filling defects within the common bile duct to suggest choledocholithiasis. Pancreas: A few scattered tiny T1 hypointense, T2 hyperintense, nonenhancing lesions are noted in the pancreas, largest of which  is in the body of the pancreas measuring only 4 mm (axial image 19 of series 4). These do not appear to communicate with the main pancreatic duct. MRCP images demonstrate no dilatation of the main pancreatic duct. No solid enhancing pancreatic neoplasm noted. No peripancreatic fluid collections or inflammatory changes. Spleen:  Unremarkable. Adrenals/Urinary Tract: Multiple T1 hypointense, T2 hyperintense, nonenhancing lesions in the left kidney, compatible with simple cysts (Bosniak class 1, no imaging follow-up recommended), largest of which is partially exophytic in the lateral aspect of the interpolar region measuring 3.9 cm (  axial image 29 of series 4). No other suspicious renal lesions. Right kidney and bilateral adrenal glands are normal in appearance. No hydroureteronephrosis in the visualized portions of the abdomen. Stomach/Bowel: Visualized portions are unremarkable. Vascular/Lymphatic: No aneurysm identified in the visualized abdominal vasculature. Retroaortic left renal vein (normal anatomical variant) incidentally noted. No lymphadenopathy noted in the abdomen. Other: No significant volume of ascites noted in the visualized portions of the peritoneal cavity. Musculoskeletal: No aggressive appearing osseous lesions are noted in the visualized portions of the skeleton. IMPRESSION: 1. 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 Repeat abdominal MRI with and without IV gadolinium with MRCP is recommended in 2 years to ensure stability. This recommendation follows ACR consensus guidelines: Management of Incidental Pancreatic Cysts: A White Paper of the ACR Incidental Findings Committee. J Am Coll Radiol 2017;14:911-923. 2. Additional incidental findings, as above. Electronically Signed   By: Toribio Aye M.D.   On: 05/02/2023 11:08    Assessment &  Plan:  Prediabetes -     Basic metabolic panel with GFR; Future -     Hemoglobin A1c; Future -     Vitamin A ; Future  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 atrophy of thyroid  -     TSH; Future -     Hepatic function panel; Future -     Vitamin A ; Future  Primary biliary cholangitis (HCC) -     Vitamin A ; Future -     Gamma GT; Future -     VITAMIN D  25 Hydroxy (Vit-D Deficiency, Fractures); Future -     Protime-INR; Future  Chronic renal disease, stage 2, mildly decreased glomerular filtration rate (GFR) between 60-89 mL/min/1.73 square meter -     Basic metabolic panel with GFR; Future -     Vitamin A ; Future -     VITAMIN D  25 Hydroxy (Vit-D Deficiency, Fractures); Future  Gastroesophageal reflux disease with esophagitis without hemorrhage -     CBC with Differential/Platelet; Future -     Vitamin A ; Future  Screening for ischemic heart disease -     EKG 12-Lead  Hyperlipidemia LDL goal <130 -     Lipid panel; Future -     TSH; Future -     Hepatic function panel; Future -     Vitamin A ; Future  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  Debby Molt, MD

## 2024-05-30 DIAGNOSIS — S299XXA Unspecified injury of thorax, initial encounter: Secondary | ICD-10-CM | POA: Diagnosis not present

## 2024-05-30 DIAGNOSIS — S065X0A Traumatic subdural hemorrhage without loss of consciousness, initial encounter: Secondary | ICD-10-CM | POA: Diagnosis not present

## 2024-05-30 DIAGNOSIS — R40241 Glasgow coma scale score 13-15, unspecified time: Secondary | ICD-10-CM | POA: Diagnosis not present

## 2024-05-30 DIAGNOSIS — S06A0XA Traumatic brain compression without herniation, initial encounter: Secondary | ICD-10-CM | POA: Diagnosis not present

## 2024-05-30 DIAGNOSIS — S0240EA Zygomatic fracture, right side, initial encounter for closed fracture: Secondary | ICD-10-CM | POA: Diagnosis not present

## 2024-05-30 DIAGNOSIS — I722 Aneurysm of renal artery: Secondary | ICD-10-CM | POA: Diagnosis not present

## 2024-05-30 DIAGNOSIS — S0990XA Unspecified injury of head, initial encounter: Secondary | ICD-10-CM | POA: Diagnosis not present

## 2024-05-30 DIAGNOSIS — S3991XA Unspecified injury of abdomen, initial encounter: Secondary | ICD-10-CM | POA: Diagnosis not present

## 2024-05-30 DIAGNOSIS — T07XXXA Unspecified multiple injuries, initial encounter: Secondary | ICD-10-CM | POA: Diagnosis not present

## 2024-05-30 DIAGNOSIS — S40811A Abrasion of right upper arm, initial encounter: Secondary | ICD-10-CM | POA: Diagnosis not present

## 2024-05-30 DIAGNOSIS — E039 Hypothyroidism, unspecified: Secondary | ICD-10-CM | POA: Diagnosis not present

## 2024-05-30 DIAGNOSIS — J984 Other disorders of lung: Secondary | ICD-10-CM | POA: Diagnosis not present

## 2024-05-30 DIAGNOSIS — M546 Pain in thoracic spine: Secondary | ICD-10-CM | POA: Diagnosis not present

## 2024-05-30 DIAGNOSIS — S12600A Unspecified displaced fracture of seventh cervical vertebra, initial encounter for closed fracture: Secondary | ICD-10-CM | POA: Diagnosis not present

## 2024-05-30 DIAGNOSIS — K219 Gastro-esophageal reflux disease without esophagitis: Secondary | ICD-10-CM | POA: Diagnosis not present

## 2024-05-30 DIAGNOSIS — S0181XA Laceration without foreign body of other part of head, initial encounter: Secondary | ICD-10-CM | POA: Diagnosis not present

## 2024-05-30 DIAGNOSIS — W19XXXA Unspecified fall, initial encounter: Secondary | ICD-10-CM | POA: Diagnosis not present

## 2024-05-30 DIAGNOSIS — G4700 Insomnia, unspecified: Secondary | ICD-10-CM | POA: Diagnosis not present

## 2024-05-30 DIAGNOSIS — M542 Cervicalgia: Secondary | ICD-10-CM | POA: Diagnosis not present

## 2024-05-30 DIAGNOSIS — S06360A Traumatic hemorrhage of cerebrum, unspecified, without loss of consciousness, initial encounter: Secondary | ICD-10-CM | POA: Diagnosis not present

## 2024-05-30 DIAGNOSIS — N401 Enlarged prostate with lower urinary tract symptoms: Secondary | ICD-10-CM | POA: Diagnosis not present

## 2024-05-30 DIAGNOSIS — F419 Anxiety disorder, unspecified: Secondary | ICD-10-CM | POA: Diagnosis not present

## 2024-05-30 DIAGNOSIS — R7303 Prediabetes: Secondary | ICD-10-CM | POA: Diagnosis not present

## 2024-05-30 DIAGNOSIS — S066X0A Traumatic subarachnoid hemorrhage without loss of consciousness, initial encounter: Secondary | ICD-10-CM | POA: Diagnosis not present

## 2024-05-30 DIAGNOSIS — S020XXA Fracture of vault of skull, initial encounter for closed fracture: Secondary | ICD-10-CM | POA: Diagnosis not present

## 2024-05-30 DIAGNOSIS — S12601A Unspecified nondisplaced fracture of seventh cervical vertebra, initial encounter for closed fracture: Secondary | ICD-10-CM | POA: Diagnosis not present

## 2024-05-30 DIAGNOSIS — R110 Nausea: Secondary | ICD-10-CM | POA: Diagnosis not present

## 2024-05-30 DIAGNOSIS — M545 Low back pain, unspecified: Secondary | ICD-10-CM | POA: Diagnosis not present

## 2024-05-30 DIAGNOSIS — S0291XA Unspecified fracture of skull, initial encounter for closed fracture: Secondary | ICD-10-CM | POA: Diagnosis not present

## 2024-05-30 DIAGNOSIS — S199XXA Unspecified injury of neck, initial encounter: Secondary | ICD-10-CM | POA: Diagnosis not present

## 2024-05-30 DIAGNOSIS — S02841A Fracture of lateral orbital wall, right side, initial encounter for closed fracture: Secondary | ICD-10-CM | POA: Diagnosis not present

## 2024-05-30 DIAGNOSIS — S01111A Laceration without foreign body of right eyelid and periocular area, initial encounter: Secondary | ICD-10-CM | POA: Diagnosis not present

## 2024-05-30 DIAGNOSIS — S0101XA Laceration without foreign body of scalp, initial encounter: Secondary | ICD-10-CM | POA: Diagnosis not present

## 2024-05-30 DIAGNOSIS — S0219XA Other fracture of base of skull, initial encounter for closed fracture: Secondary | ICD-10-CM | POA: Diagnosis not present

## 2024-05-30 DIAGNOSIS — I4891 Unspecified atrial fibrillation: Secondary | ICD-10-CM | POA: Diagnosis not present

## 2024-05-30 DIAGNOSIS — S064X0A Epidural hemorrhage without loss of consciousness, initial encounter: Secondary | ICD-10-CM | POA: Diagnosis not present

## 2024-05-30 DIAGNOSIS — T1490XA Injury, unspecified, initial encounter: Secondary | ICD-10-CM | POA: Diagnosis not present

## 2024-05-30 DIAGNOSIS — E785 Hyperlipidemia, unspecified: Secondary | ICD-10-CM | POA: Diagnosis not present

## 2024-05-31 DIAGNOSIS — T1490XA Injury, unspecified, initial encounter: Secondary | ICD-10-CM | POA: Diagnosis not present

## 2024-06-01 DIAGNOSIS — T1490XA Injury, unspecified, initial encounter: Secondary | ICD-10-CM | POA: Diagnosis not present

## 2024-06-04 ENCOUNTER — Telehealth: Payer: Self-pay

## 2024-06-04 NOTE — Transitions of Care (Post Inpatient/ED Visit) (Signed)
   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  Shona Prow RN, CCM   [facility]Population [facility] RN Care Manager [phone]

## 2024-06-04 NOTE — Transitions of Care (Post Inpatient/ED Visit) (Signed)
   06/04/2024  Name: BRECKYN TICAS, MD MRN: [id number] DOB: 04-13-1951  Today's TOC FU Call Status: Today's TOC FU Call Status:: Unsuccessful Call (1st Attempt) Unsuccessful Call (1st Attempt) Date: 06/04/24  Attempted to reach the patient regarding the most recent Inpatient/ED visit.  Follow Up Plan: Additional outreach attempts will be made to reach the patient to complete the Transitions of Care (Post Inpatient/ED visit) call.   Shona Prow RN, CCM [place]  [facility]Population Health RN Care Manager [phone]

## 2024-06-08 ENCOUNTER — Encounter: Payer: Self-pay | Admitting: Internal Medicine

## 2024-06-10 ENCOUNTER — Telehealth: Payer: Self-pay

## 2024-06-10 ENCOUNTER — Other Ambulatory Visit: Payer: Self-pay | Admitting: Internal Medicine

## 2024-06-10 DIAGNOSIS — S42001A Fracture of unspecified part of right clavicle, initial encounter for closed fracture: Secondary | ICD-10-CM | POA: Diagnosis not present

## 2024-06-10 DIAGNOSIS — H53451 Other localized visual field defect, right eye: Secondary | ICD-10-CM | POA: Diagnosis not present

## 2024-06-10 DIAGNOSIS — S0292XD Unspecified fracture of facial bones, subsequent encounter for fracture with routine healing: Secondary | ICD-10-CM | POA: Insufficient documentation

## 2024-06-10 DIAGNOSIS — S0219XA Other fracture of base of skull, initial encounter for closed fracture: Secondary | ICD-10-CM | POA: Diagnosis not present

## 2024-06-10 DIAGNOSIS — H532 Diplopia: Secondary | ICD-10-CM | POA: Diagnosis not present

## 2024-06-10 DIAGNOSIS — S02841A Fracture of lateral orbital wall, right side, initial encounter for closed fracture: Secondary | ICD-10-CM | POA: Diagnosis not present

## 2024-06-10 DIAGNOSIS — S020XXD Fracture of vault of skull, subsequent encounter for fracture with routine healing: Secondary | ICD-10-CM | POA: Diagnosis not present

## 2024-06-10 DIAGNOSIS — H4921 Sixth [abducent] nerve palsy, right eye: Secondary | ICD-10-CM | POA: Diagnosis not present

## 2024-06-10 DIAGNOSIS — S0285XD Fracture of orbit, unspecified, subsequent encounter for fracture with routine healing: Secondary | ICD-10-CM | POA: Diagnosis not present

## 2024-06-10 MED ORDER — TRAMADOL HCL 50 MG PO TABS: 50.0000 mg | ORAL_TABLET | Freq: Four times a day (QID) | ORAL | 0 refills | Status: AC | PRN

## 2024-06-10 NOTE — Telephone Encounter (Signed)
 Pt has called in for an update on his RX request for tramadol .   Requested pt come in for a HFU with Corean Ku on 7.25.25 but pt declined. States he is in too fragile a condition to come in for an appointment due to his brain injury.   Pt is requesting a call from Dr. Joshua [name] to discuss options.  [phone] (Mobile)

## 2024-06-10 NOTE — Telephone Encounter (Signed)
 William Luna, On 05/31/2024 I was hit by a falling tree limb at [facility]. I suffered severe traumatic brain injury. I was transported urgently to the [facility] in [place]. Fortunately I survived and I was discharged home 06/03/2024. I have outpatient follow visits scheduled next week for Neurosurgery, Trauma Surgery, ENT, Ophthalmology, Speech Therapy. I was instructed to make follow-up visit with you. I was planning to arrange that visit for a time after all of my other post hospital appointments. You can see all of the data in MyChart.   One of my discharge meds is Tramadol  50mg  q6h for breakthrough pain.  I was given no refills, and I will run out in 3 days. There are no instructions on who to ask to refill the Tramadol .   I have a lot of pain, so running out in 3 days is not a good option. Could you please prescribe Tramadol  50mg  PO q6h.  It is hard to know how much I will need. It might work to prescribe 28 tabs (one week if I need all of them) with one refill.   I am happy to discuss my situation with you by phone, or virtually. I am lucky to be alive. Please get back to me soon.    Thanks,   [name]

## 2024-06-10 NOTE — Telephone Encounter (Unsigned)
 Copied from CRM [id number]. Topic: Clinical - Medication Question >> Jun 10, 2024  1:09 PM Shereese L wrote: Reason for CRM: patient is calling because he has been requesting  Tramadol  50mg  q6h for breakthrough pain and he stated that he hadn't received a call back about the refill.  He's requesting for Dr. Joshua to give him a call back because he was advised that it'll be sent to the pharmacy. Spoke with front desk agent in office and was adv that he may have to come in because the medication wasn't prescribed by the Dr. Joshua. Patient stated that he can't be moved because he just had Traumatic brain injury and just needs pain meds. Patient declined an offered appt for tomorrow with another doctor and stated he needs to hear from the Dr. Joshua by 3pm today.

## 2024-06-15 NOTE — Progress Notes (Signed)
 I saw and evaluated the patient and reviewed the resident's note. I agree with the resident's findings and plan.   I have reviewed the following tests and agree with the resident's interpretation:   OCT  Electronically Signed by: Laverda Marry Art, MD, Attending Physician 06/15/2024 12:14 PM

## 2024-06-16 DIAGNOSIS — Z5189 Encounter for other specified aftercare: Secondary | ICD-10-CM | POA: Diagnosis not present

## 2024-06-16 DIAGNOSIS — S0219XD Other fracture of base of skull, subsequent encounter for fracture with routine healing: Secondary | ICD-10-CM | POA: Diagnosis not present

## 2024-06-16 DIAGNOSIS — S0240ED Zygomatic fracture, right side, subsequent encounter for fracture with routine healing: Secondary | ICD-10-CM | POA: Diagnosis not present

## 2024-06-16 DIAGNOSIS — W19XXXD Unspecified fall, subsequent encounter: Secondary | ICD-10-CM | POA: Diagnosis not present

## 2024-06-16 DIAGNOSIS — S0285XD Fracture of orbit, unspecified, subsequent encounter for fracture with routine healing: Secondary | ICD-10-CM | POA: Diagnosis not present

## 2024-06-16 DIAGNOSIS — S0993XD Unspecified injury of face, subsequent encounter: Secondary | ICD-10-CM | POA: Diagnosis not present

## 2024-06-16 DIAGNOSIS — Z4802 Encounter for removal of sutures: Secondary | ICD-10-CM | POA: Diagnosis not present

## 2024-06-16 NOTE — Progress Notes (Signed)
  Subjective  Patient ID: William Luna is a 73 y.o. male.   The following information was reviewed by members of the visit team:  Allergies  Meds  Problems      HPI [name] is a 73 yo male who presented to [facility] ED on 07/13 after he was struck in the head by a tree limb. He was admitted and treated for his injuries and discharged on 0 717. Vital signs reviewed and stable. Vitals: BP: 127/68, Temp: 97.7 F (36.5 C), Temp Source: Temporal, Heart Rate: 84, SpO2: 100 %, Height: 1.727 m (5' 8), Weight: 67.1 kg (148 lb); BMI (Calculated): 22.5  [name] is ambulatory today without difficulty and accompanied by his wife. Patient presents today for follow up of right scalp laceration and suture removal.  Laceration healing well with approximated edges, no signs of infection noted.  All sutures removed today without difficulty.  No further follow-up in this clinic is necessary at this time  [name] was treated by other services during their hospital stay and states he will follow up with the neurosurgery, ENT, and ophthalmology services as scheduled.    Review of Systems  Constitutional: Negative.  Negative for activity change, appetite change, chills, diaphoresis, fatigue, fever and unexpected weight change.  HENT: Negative.    Respiratory: Negative.  Negative for cough and shortness of breath.   Cardiovascular: Negative.   Gastrointestinal: Negative.  Negative for abdominal distention, abdominal pain, constipation, diarrhea, nausea and vomiting.  Musculoskeletal: Negative.   Skin:  Positive for wound.    Objective  Physical Exam Constitutional:      Appearance: Normal appearance.  HENT:     Head: Normocephalic.     Comments: Right scalp laceration healing well without signs of infection with c/d/i sutures Neck:     Comments: Aspen hard cervical collar noted in place Cardiovascular:     Rate and Rhythm: Normal rate and regular rhythm.     Heart sounds: Normal heart sounds.   Pulmonary:     Effort: Pulmonary effort is normal. No respiratory distress.     Breath sounds: Normal breath sounds.  Abdominal:     General: Abdomen is flat. There is no distension.     Palpations: Abdomen is soft.     Tenderness: There is no abdominal tenderness.   Musculoskeletal:        General: Normal range of motion.   Skin:    General: Skin is warm and dry.   Neurological:     Mental Status: He is alert and oriented to person, place, and time.   Psychiatric:        Mood and Affect: Mood normal.        Behavior: Behavior normal.      Assessment/Plan  1. Encounter for post-traumatic wound check      2. Encounter for removal of sutures      3. Fall, subsequent encounter         Continue to wear the hard cervical collar as directed Continue to follow up with the consulted services as scheduled Right scalp laceration sutures were today Continue to monitor for signs of infection discussed Follow-up with trauma as needed  Electronically signed: Nena Renae Hurst, FNP 06/16/2024  9:11 AM

## 2024-06-16 NOTE — Telephone Encounter (Signed)
**  APPOINTMENT REQUEST**  Wife [name]  Are they asking to reschedule? No If yes, due to: N/A Is patient calling due to a recall? No Is this appointment to be scheduled with a resident? Yes Patient is requesting an appointment to be seen for:  Return in about 6 weeks (around 07/22/2024) for With Dr. Erle or Next Available Resident w/ Dr. Jadine staffing.  Patient prefers: No Preference   Was the first available appointment offered at the time of call: No-   nothing was available at time of call   Please return call to: Wife at [phone]  Is it okay to leave a detailed message: Yes  Caller has been made aware clinic has 5-7 business days to return call: Yes

## 2024-06-17 ENCOUNTER — Other Ambulatory Visit: Payer: Self-pay

## 2024-06-17 ENCOUNTER — Other Ambulatory Visit: Payer: Self-pay | Admitting: Internal Medicine

## 2024-06-17 ENCOUNTER — Ambulatory Visit: Attending: Physician Assistant

## 2024-06-17 DIAGNOSIS — K743 Primary biliary cirrhosis: Secondary | ICD-10-CM

## 2024-06-17 DIAGNOSIS — R41841 Cognitive communication deficit: Secondary | ICD-10-CM | POA: Insufficient documentation

## 2024-06-17 NOTE — Therapy (Signed)
 OUTPATIENT SPEECH LANGUAGE PATHOLOGY EVALUATION   Patient Name: William DELAIR, MD MRN: [id number] DOB:[birth year], 73 y.o., male Today's Date: 06/17/2024  PCP: Charlena Molt, MD REFERRING PROVIDER: Leverette Jerrell PARAS., PA-C  END OF SESSION:  End of Session - 06/17/24 2154     Visit Number 1    Number of Visits 9    Date for SLP Re-Evaluation 08/13/24   first ST 06-22-24   SLP Start Time 1021    SLP Stop Time  1105    SLP Time Calculation (min) 44 min    Activity Tolerance Patient tolerated treatment well          Past Medical History:  Diagnosis Date   Adenomatous colon polyp    2003   ANEMIA, IRON DEFICIENCY    mild in the past   BPH (benign prostatic hyperplasia)    Cataract    CERVICAL RADICULOPATHY    disectomy in 1990s   DDD (degenerative disc disease), lumbar oct '12   L4-5, ESI therapy successful 10/12; L5-S1 radiculopathy 3/13   Fundic gland polyps of stomach, benign    GERD    EGD negative x 3, off medication (July '13)   Hearing loss in left ear    HYPERLIPIDEMIA, MILD    lipitor 10mg     Hypothyroidism    Liver cyst    Primary biliary cholangitis (HCC) 09/18/2018   Renal cyst    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[place])   mid-tongue squamous cell - excised, clean margins, 11 nodes negative, no adjuvant therapy   TRANSAMINASES, SERUM, ELEVATED    Past Surgical History:  Procedure Laterality Date   BACK SURGERY  '90s ([place])   CERVICAL DISC - diskectomy w/ fusion, anterior   CHOLECYSTECTOMY N/A 01/05/2021   Procedure: LAPAROSCOPIC CHOLECYSTECTOMY WITH INTRAOPERATIVE CHOLANGIOGRAM;  Surgeon: Gladis Cough, MD;  Location: [facility];  Service: General;  Laterality: N/A;   COLONOSCOPY     Multiple   ESOPHAGOGASTRODUODENOSCOPY     Multiple   HEMIGLOSSECTOMY  10/30/2011   Procedure: HEMIGLOSSECTOMY;  Surgeon: Norleen CHRISTELLA Notice;  Location: [facility];  Service: ENT;  Laterality: Right;  Right tongue resection   LUMBAR LAMINECTOMY/DECOMPRESSION MICRODISCECTOMY   01/29/2012   Procedure: LUMBAR LAMINECTOMY/DECOMPRESSION MICRODISCECTOMY;  Surgeon: Victory Gens, MD;  Location: [facility];  Service: Neurosurgery;  Laterality: Left;  Left L5-S1 Microdiskectomy   LUMBAR LAMINECTOMY/DECOMPRESSION MICRODISCECTOMY  07/13/2012   Procedure: LUMBAR LAMINECTOMY/DECOMPRESSION MICRODISCECTOMY 1 LEVEL;  Surgeon: Victory Gens, MD;  Location: [facility];  Service: Neurosurgery;  Laterality: Left;  Redo Left Lumbar five-sacral one Microdiskectomy   nail avulsion     right first finger due to a wart   RADICAL NECK DISSECTION  10/30/2011   Procedure: RADICAL NECK DISSECTION;  Surgeon: Norleen CHRISTELLA Notice;  Location: [facility];  Service: ENT;  Laterality: Right;   Patient Active Problem List   Diagnosis Date Noted   Closed fracture of facial bone with routine healing 06/10/2024   Screening for ischemic heart disease 05/17/2024   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, right 05/17/2024   Rotator cuff tendinitis, right 07/16/2023   Chronic renal disease, stage 2, mildly decreased glomerular filtration rate (GFR) between 60-89 mL/min/1.73 square meter 12/12/2022   Insomnia due to anxiety and fear 09/05/2022   Primary biliary cholangitis (HCC) 09/18/2018   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 atrophy of thyroid  10/01/2016   Benign prostatic hyperplasia without lower urinary tract symptoms 10/01/2016   Prediabetes 10/09/2015   Squamous cell cancer of tongue (  HCC)    Hyperlipidemia LDL goal <130 11/17/2008   Obsessive behaviors 11/17/2008   GERD 11/17/2008   Degenerative joint disease (DJD) of lumbar spine 11/17/2008   CERVICAL RADICULOPATHY 11/17/2008    ONSET DATE: 05/30/24   REFERRING DIAG: U92.XXXA (ICD-10-CM) - Unspecified multiple injuries, initial encounter T14.90XA (ICD-10-CM) - Injury, unspecified, initial encounter  THERAPY DIAG:  Cognitive communication deficit  Rationale for Evaluation and Treatment: Rehabilitation  SUBJECTIVE:   SUBJECTIVE STATEMENT: That is  alarming to me. Danell, re: pt's second attempt at clock drawing)  Pt accompanied by: significant other Wife [name]  PERTINENT HISTORY: HLD, GERD, BPH, Hypothyrodism, Primary biliary cholangitis, CKD, prediabetes and PSH: ACDF C4-5   PAIN:  Are you having pain? Did not ask today  FALLS: Has patient fallen in last 6 months?  Yes, Number of falls: one, at time of event  LIVING ENVIRONMENT: Lives with: lives with their spouse Lives in: House/apartment  PLOF:  Level of assistance: Independent with ADLs, Independent with IADLs Employment: Retired cardiologist  PATIENT GOALS: Inquire whether skilled ST would benefit him at this time  OBJECTIVE:  Note: Objective measures were completed at Evaluation unless otherwise noted.  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 On clock drawing pt did not follow instructions. SLP provided the same task with more detailed instructions and pt performance still vastly abnormal. [name] may have some mild alternating or divided attention deficits due to stating If I get busy with something I may miss my (pain medication administration) time so pt adjusts subsequent administration times. SLP suggested [place] set alarms for these times. SLP postulates pt may have deficit in error awareness and/or deficit in attention to detail based on performance on clock drawing task.  Behavior: Poor frustration tolerance. Initially pt appeared to explain away deficits. After wife stated she was disturbed with pt's performance with clock drawing, pt no longer attempted to explain deficits away.  [name] stated pt demonstrated mildly decr'd memory, pt did not mention this to SLP. This may indicate an awareness deficit (vs an aspect of pt's premorbid personality - possible  difficulty admitting errors). As SLP and pt cont to work together it may be revealed that what might be considered deficits at this time are actually aspects of pt's personality. [name] indicated she thought pt's memory was not as sharp as prior to TBI incident. This could be more related to pt's attention (or auditory comprehension) post-TBI. Pt did say fish x2 when performing animal naming on SLUMS. [name] indicated that prior to TBI, he thought it may be his baseline that recall of details of medical journals was more difficult for him.  COGNITIVE COMMUNICATION: Following directions: Follows multi-step commands inconsistently  Auditory comprehension: Impaired: vs decr'd attention. Difficult to ascertain at this time. More testing may be necessary Verbal expression: WFL Functional communication: WFL  ORAL MOTOR EXAMINATION: Overall status: WFL  STANDARDIZED ASSESSMENTS:  SLUMS: 26/30 (where >/= 27/30 is WNL)   [employer] Mental Status (SLUMS)  Orientation: 3/3 Delayed Recall w/ Interference: 5/5 Numeric Calculation and Registration: 3/3 Immediate Recall w/ Interference (Generative naming): 3/3 Registration and Digit Span: 2/2 Visual Spatial/Exec Functioning: 2/6 Executive Functioning/Extrapolation: 8/8  PATIENT REPORTED OUTCOME MEASURES (PROM): Cognitive Function: will be provided to both pt and wife in first therapy session  TREATMENT DATE:   06/17/24: n/a  PATIENT EDUCATION: Education details: eval results, pt errors on clock task/s, SLP thoughts about pt's possible deficits, home tasks, and rationale for skilled ST Person educated: Patient and Spouse Education method: Explanation and Verbal cues Education comprehension: verbalized understanding and acknowledged he may have some cognitive communication deficits   GOALS: Goals reviewed with  patient? No  SHORT TERM GOALS: Target date: 07/16/24  Pt will demo ability to attend to detail in functional novel mod complex cognitive linguistic tasks in 2 sessions Baseline: Goal status: INITIAL  2.  Pt will complete novel homework tasks with 100% success given mod I (compensations) in 2 sessions  Baseline:  Goal status: INITIAL  3.  Pt will use trained memory strategies successfully with medication administration for 2 weeks with accuracy at least 95% re: timing Baseline:  Goal status: INITIAL   LONG TERM GOALS: Target date: 08/13/24  Pt will improve PROM from initial administration Baseline:  Goal status: INITIAL  2.  Pt will demo ability to attend to detail in functional novel mod complex cognitive linguistic tasks in 2 sessions Baseline:  Goal status: INITIAL  3.  Pt will successfully and independently complete daily tasks using trained memory strategies PRN  Baseline:  Goal status: INITIAL   ASSESSMENT:  CLINICAL IMPRESSION: Patient is a 73 y.o. M who was seen today for assessment of cognitive linguistics using the SLUMS. CLQT or another cognitive linguistic evaluation may need to be incorporated in pt's first therapy session to get a bigger picture of pt's deficits. If needed, more goals will be added after that testing. Please see cognition above for details of pt's performance today. 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   OBJECTIVE IMPAIRMENTS: Possibly include attention, memory, awareness, executive functioning, and /or auditory processing and comprehension. These impairments are limiting patient from managing medications, household responsibilities, ADLs/IADLs, and effectively communicating at home and in community. Factors affecting potential to achieve goals and functional outcome are cooperation/participation level due to initial response of pt to explain away deficits. Patient will benefit from skilled  SLP services to address above impairments and improve overall function.  REHAB POTENTIAL: Excellent  PLAN:  SLP FREQUENCY: 1x/week  SLP DURATION: 8 weeks  PLANNED INTERVENTIONS: Environmental controls, Cueing hierachy, Cognitive reorganization, Internal/external aids, Functional tasks, SLP instruction and feedback, Compensatory strategies, Patient/family education, and [id number] Treatment of speech (30 or 45 min)     Corleen Otwell, CCC-SLP 06/17/2024, 9:55 PM

## 2024-06-21 NOTE — Telephone Encounter (Signed)
 Last OV 05/17/24 Next OV 07/01/24  Last refill 12/21/23 Qty #360/1

## 2024-06-22 ENCOUNTER — Ambulatory Visit

## 2024-06-23 ENCOUNTER — Encounter: Payer: Self-pay | Admitting: Internal Medicine

## 2024-06-23 ENCOUNTER — Other Ambulatory Visit: Payer: Self-pay

## 2024-06-23 DIAGNOSIS — F409 Phobic anxiety disorder, unspecified: Secondary | ICD-10-CM

## 2024-06-23 MED ORDER — ZALEPLON 10 MG PO CAPS: 10.0000 mg | ORAL_CAPSULE | Freq: Every evening | ORAL | 0 refills | Status: AC | PRN

## 2024-06-27 ENCOUNTER — Other Ambulatory Visit: Payer: Self-pay | Admitting: Medical Genetics

## 2024-06-28 ENCOUNTER — Ambulatory Visit: Attending: Physician Assistant

## 2024-06-28 DIAGNOSIS — R41841 Cognitive communication deficit: Secondary | ICD-10-CM | POA: Diagnosis not present

## 2024-06-28 NOTE — Patient Instructions (Signed)
    Listen to a 10-15 minute YouTube with [name] and take notes

## 2024-06-28 NOTE — Therapy (Signed)
 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 DOB:11/24/1950, 73 y.o., male Today's Date: 06/28/2024  PCP: Charlena Molt, MD REFERRING PROVIDER: Leverette Jerrell PARAS., PA-C  END OF SESSION:  End of Session - 06/28/24 1454     Visit Number 2    Number of Visits 9    Date for SLP Re-Evaluation 08/13/24   first ST 06-22-24   SLP Start Time 1452    SLP Stop Time  1532    SLP Time Calculation (min) 40 min    Activity Tolerance Patient tolerated treatment well          Past Medical History:  Diagnosis Date   Adenomatous colon polyp    2003   ANEMIA, IRON DEFICIENCY    mild in the past   BPH (benign prostatic hyperplasia)    Cataract    CERVICAL RADICULOPATHY    disectomy in 1990s   DDD (degenerative disc disease), lumbar oct '12   L4-5, ESI therapy successful 10/12; L5-S1 radiculopathy 3/13   Fundic gland polyps of stomach, benign    GERD    EGD negative x 3, off medication (July '13)   Hearing loss in left ear    HYPERLIPIDEMIA, MILD    lipitor 10mg     Hypothyroidism    Liver cyst    Primary biliary cholangitis (HCC) 09/18/2018   Renal cyst    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[place])   mid-tongue squamous cell - excised, clean margins, 11 nodes negative, no adjuvant therapy   TRANSAMINASES, SERUM, ELEVATED    Past Surgical History:  Procedure Laterality Date   BACK SURGERY  '90s ([place])   CERVICAL DISC - diskectomy w/ fusion, anterior   CHOLECYSTECTOMY N/A 01/05/2021   Procedure: LAPAROSCOPIC CHOLECYSTECTOMY WITH INTRAOPERATIVE CHOLANGIOGRAM;  Surgeon: Gladis Cough, MD;  Location: [facility];  Service: General;  Laterality: N/A;   COLONOSCOPY     Multiple   ESOPHAGOGASTRODUODENOSCOPY     Multiple   HEMIGLOSSECTOMY  10/30/2011   Procedure: HEMIGLOSSECTOMY;  Surgeon: Norleen CHRISTELLA Notice;  Location: [facility];  Service: ENT;  Laterality: Right;  Right tongue resection   LUMBAR LAMINECTOMY/DECOMPRESSION MICRODISCECTOMY   01/29/2012   Procedure: LUMBAR LAMINECTOMY/DECOMPRESSION MICRODISCECTOMY;  Surgeon: Victory Gens, MD;  Location: [facility];  Service: Neurosurgery;  Laterality: Left;  Left L5-S1 Microdiskectomy   LUMBAR LAMINECTOMY/DECOMPRESSION MICRODISCECTOMY  07/13/2012   Procedure: LUMBAR LAMINECTOMY/DECOMPRESSION MICRODISCECTOMY 1 LEVEL;  Surgeon: Victory Gens, MD;  Location: [facility];  Service: Neurosurgery;  Laterality: Left;  Redo Left Lumbar five-sacral one Microdiskectomy   nail avulsion     right first finger due to a wart   RADICAL NECK DISSECTION  10/30/2011   Procedure: RADICAL NECK DISSECTION;  Surgeon: Norleen CHRISTELLA Notice;  Location: [facility];  Service: ENT;  Laterality: Right;   Patient Active Problem List   Diagnosis Date Noted   Closed fracture of facial bone with routine healing 06/10/2024   Screening for ischemic heart disease 05/17/2024   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, right 05/17/2024   Rotator cuff tendinitis, right 07/16/2023   Chronic renal disease, stage 2, mildly decreased glomerular filtration rate (GFR) between 60-89 mL/min/1.73 square meter 12/12/2022   Insomnia due to anxiety and fear 09/05/2022   Primary biliary cholangitis (HCC) 09/18/2018   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 atrophy of thyroid  10/01/2016   Benign prostatic hyperplasia without lower urinary tract symptoms 10/01/2016   Prediabetes 10/09/2015   Squamous cell cancer of tongue (  HCC)    Hyperlipidemia LDL goal <130 11/17/2008   Obsessive behaviors 11/17/2008   GERD 11/17/2008   Degenerative joint disease (DJD) of lumbar spine 11/17/2008   CERVICAL RADICULOPATHY 11/17/2008    ONSET DATE: 05/30/24   REFERRING DIAG: U92.XXXA (ICD-10-CM) - Unspecified multiple injuries, initial encounter T14.90XA (ICD-10-CM) - Injury, unspecified, initial encounter  THERAPY DIAG:  Cognitive communication deficit  Rationale for Evaluation and Treatment: Rehabilitation  SUBJECTIVE:   SUBJECTIVE STATEMENT: Pt  arrived with [name] today in pleasant mood.  Pt accompanied by: significant other Wife [name]  PERTINENT HISTORY: HLD, GERD, BPH, Hypothyrodism, Primary biliary cholangitis, CKD, prediabetes and PSH: ACDF C4-5   PAIN:  Are you having pain? Did not ask today  FALLS: Has patient fallen in last 6 months?  Yes, Number of falls: one, at time of event  LIVING ENVIRONMENT: Lives with: lives with their spouse Lives in: House/apartment  PLOF:  Level of assistance: Independent with ADLs, Independent with IADLs Employment: Retired cardiologist  PATIENT GOALS: Inquire whether skilled ST would benefit him at this time  OBJECTIVE:  Note: Objective measures were completed at Evaluation unless otherwise noted.  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 On clock drawing pt did not follow instructions. SLP provided the same task with more detailed instructions and pt performance still vastly abnormal. [name] may have some mild alternating or divided attention deficits due to stating If I get busy with something I may miss my (pain medication administration) time so pt adjusts subsequent administration times. SLP suggested [place] set alarms for these times. SLP postulates pt may have deficit in error awareness and/or deficit in attention to detail based on performance on clock drawing task.  Behavior: Poor frustration tolerance. Initially pt appeared to explain away deficits. After wife stated she was disturbed with pt's performance with clock drawing, pt no longer attempted to explain deficits away.  [name] stated pt demonstrated mildly decr'd memory, pt did not mention this to SLP. This may indicate an awareness deficit (vs an aspect of pt's premorbid personality - possible difficulty admitting errors).  As SLP and pt cont to work together it may be revealed that what might be considered deficits at this time are actually aspects of pt's personality. [name] indicated she thought pt's memory was not as sharp as prior to TBI incident. This could be more related to pt's attention (or auditory comprehension) post-TBI. Pt did say fish x2 when performing animal naming on SLUMS. [name] indicated that prior to TBI, he thought it may be his baseline that recall of details of medical journals was more difficult for him.  COGNITIVE COMMUNICATION: Following directions: Follows multi-step commands inconsistently  Auditory comprehension: Impaired: vs decr'd attention. Difficult to ascertain at this time. More testing may be necessary Verbal expression: WFL Functional communication: WFL  ORAL MOTOR EXAMINATION: Overall status: WFL  STANDARDIZED ASSESSMENTS:  SLUMS: 26/30 (where >/= 27/30 is WNL)   [employer] Mental Status (SLUMS)  Orientation: 3/3 Delayed Recall w/ Interference: 5/5 Numeric Calculation and Registration: 3/3 Immediate Recall w/ Interference (Generative naming): 3/3 Registration and Digit Span: 2/2 Visual Spatial/Exec Functioning: 2/6 Executive Functioning/Extrapolation: 8/8  PATIENT REPORTED OUTCOME MEASURES (PROM): Cognitive Function: provided 06/28/24 for wife and pt, separately.  TREATMENT DATE:   06/28/24: SLP used some subtests from Cognitive Linguistic Quick Test (CLQT) to gather more information about areas where pt might show deficits, in order to hone ST treatment plan. In some subtests targeting attention and attention to detail (symbol trails, generative naming, mazes, story retelling), pt demonstrated difficulty with following and recalling auditory details. Practically at home, [name] is seeing some evidence of decr'd auditory memory and/or attention.  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 The extent to which this condition affected pt's performance today is unknown, however it should be considered when presenting pt visual information during evaluation and therapy tasks. SLP will complete CLQT next session and will report his scores at that time. If pt desires more complete and precise information regarding cognition a neuropsychological evaluation should be considered which uses a greater number of assessments with narrowed diagnostic foci. Pt also wears hearing aids and these were placed for therapy today. SLP suggested pt should complete practical auditory attention tasks such as listen to/watch 10-20 minutes auditory information (YouTube, [employer], podcasts) and take notes to provide synopsis with [name]. Pt should pause video/audio and take breaks if/when he feels/thinks his brain feels taxed.  06/17/24: n/a  PATIENT EDUCATION: Education details: see treatment date for today's date Person educated: Patient and Spouse Education method: Explanation and Demonstration Education comprehension: verbalized understanding and needs further education   GOALS: Goals reviewed with patient? No  SHORT TERM GOALS: Target date: 07/16/24  Pt will demo ability to attend to detail in functional novel mod complex cognitive linguistic tasks in 2 sessions Baseline: Goal status: INITIAL  2.  Pt will complete novel homework tasks with 100% success given mod I (compensations) in 2 sessions  Baseline:  Goal status: INITIAL  3.  Pt will use trained memory strategies successfully with medication administration for 2 weeks with accuracy at least 95% re: timing Baseline:  Goal status: INITIAL   LONG TERM GOALS: Target date: 08/13/24  Pt will improve PROM from initial administration Baseline:  Goal status: INITIAL  2.  Pt will demo ability to attend to  detail in functional novel mod complex cognitive linguistic tasks in 2 sessions Baseline:  Goal status: INITIAL  3.  Pt will successfully and independently complete daily tasks using trained memory strategies PRN  Baseline:  Goal status: INITIAL   ASSESSMENT:  CLINICAL IMPRESSION: Patient is a 73 y.o. M who was seen today for ongoing treatment of cognitive linguistics using portions of the CLQT. If needed, more goals will be added after that testing. See treatment date above for today's date for further details on today's session. He would cont to benefit from skilled ST for rehab for cognitive deficits post TBI.   OBJECTIVE IMPAIRMENTS: Possibly include attention, memory, awareness, executive functioning, and /or auditory processing and comprehension. These impairments are limiting patient from managing medications, household responsibilities, ADLs/IADLs, and effectively communicating at home and in community. Factors affecting potential to achieve goals and functional outcome are cooperation/participation level due to initial response of pt to explain away deficits. Patient will benefit from skilled SLP services to address above impairments and improve overall function.  REHAB POTENTIAL: Excellent  PLAN:  SLP FREQUENCY: 1x/week  SLP DURATION: 8 weeks  PLANNED INTERVENTIONS: Environmental controls, Cueing hierachy, Cognitive reorganization, Internal/external aids, Functional tasks, SLP instruction and feedback, Compensatory strategies, Patient/family education, and [id number] Treatment of speech (30 or 45 min)  Rishika Mccollom, CCC-SLP 06/28/2024, 2:54 PM

## 2024-07-01 ENCOUNTER — Ambulatory Visit: Admitting: Internal Medicine

## 2024-07-01 ENCOUNTER — Ambulatory Visit: Payer: Self-pay | Admitting: Internal Medicine

## 2024-07-01 ENCOUNTER — Encounter: Payer: Self-pay | Admitting: Internal Medicine

## 2024-07-01 VITALS — BP 128/68 | HR 77 | Temp 98.1°F | Resp 16 | Ht 68.0 in | Wt 147.4 lb

## 2024-07-01 DIAGNOSIS — R7303 Prediabetes: Secondary | ICD-10-CM | POA: Diagnosis not present

## 2024-07-01 DIAGNOSIS — E785 Hyperlipidemia, unspecified: Secondary | ICD-10-CM | POA: Diagnosis not present

## 2024-07-01 DIAGNOSIS — K743 Primary biliary cirrhosis: Secondary | ICD-10-CM | POA: Diagnosis not present

## 2024-07-01 DIAGNOSIS — D539 Nutritional anemia, unspecified: Secondary | ICD-10-CM | POA: Diagnosis not present

## 2024-07-01 DIAGNOSIS — D473 Essential (hemorrhagic) thrombocythemia: Secondary | ICD-10-CM | POA: Insufficient documentation

## 2024-07-01 DIAGNOSIS — E034 Atrophy of thyroid (acquired): Secondary | ICD-10-CM | POA: Diagnosis not present

## 2024-07-01 DIAGNOSIS — N182 Chronic kidney disease, stage 2 (mild): Secondary | ICD-10-CM | POA: Diagnosis not present

## 2024-07-01 LAB — LIPID PANEL
Cholesterol: 125 mg/dL (ref 0–200)
HDL: 42.1 mg/dL (ref >39.00)
LDL Cholesterol: 70 mg/dL (ref 0–99)
NonHDL: 83.28
Total CHOL/HDL Ratio: 3
Triglycerides: 67 mg/dL (ref 0.0–149.0)
VLDL: 13.4 mg/dL (ref 0.0–40.0)

## 2024-07-01 LAB — BASIC METABOLIC PANEL WITH GFR
BUN: 16 mg/dL (ref 6–23)
CO2: 31 meq/L (ref 19–32)
Calcium: 9.9 mg/dL (ref 8.4–10.5)
Chloride: 100 meq/L (ref 96–112)
Creatinine, Ser: 0.84 mg/dL (ref 0.40–1.50)
GFR: 85.4 mL/min (ref >60.00)
Glucose, Bld: 87 mg/dL (ref 70–99)
Potassium: 4.2 meq/L (ref 3.5–5.1)
Sodium: 139 meq/L (ref 135–145)

## 2024-07-01 LAB — CBC WITH DIFFERENTIAL/PLATELET
Basophils Absolute: 0 K/uL (ref 0.0–0.1)
Basophils Relative: 0.3 % (ref 0.0–3.0)
Eosinophils Absolute: 0.1 K/uL (ref 0.0–0.7)
Eosinophils Relative: 0.9 % (ref 0.0–5.0)
HCT: 32.6 % — ABNORMAL LOW (ref 39.0–52.0)
Hemoglobin: 10.8 g/dL — ABNORMAL LOW (ref 13.0–17.0)
Lymphocytes Relative: 14 % (ref 12.0–46.0)
Lymphs Abs: 1.1 K/uL (ref 0.7–4.0)
MCHC: 33.1 g/dL (ref 30.0–36.0)
MCV: 86.5 fl (ref 78.0–100.0)
Monocytes Absolute: 0.5 K/uL (ref 0.1–1.0)
Monocytes Relative: 7 % (ref 3.0–12.0)
Neutro Abs: 5.9 K/uL (ref 1.4–7.7)
Neutrophils Relative %: 77.8 % — ABNORMAL HIGH (ref 43.0–77.0)
Platelets: 402 K/uL — ABNORMAL HIGH (ref 150.0–400.0)
RBC: 3.77 Mil/uL — ABNORMAL LOW (ref 4.22–5.81)
RDW: 14.2 % (ref 11.5–15.5)
WBC: 7.6 K/uL (ref 4.0–10.5)

## 2024-07-01 LAB — HEPATIC FUNCTION PANEL
ALT: 14 U/L (ref 0–53)
AST: 22 U/L (ref 0–37)
Albumin: 4.1 g/dL (ref 3.5–5.2)
Alkaline Phosphatase: 227 U/L — ABNORMAL HIGH (ref 39–117)
Bilirubin, Direct: 0.1 mg/dL (ref 0.0–0.3)
Total Bilirubin: 0.5 mg/dL (ref 0.2–1.2)
Total Protein: 8.1 g/dL (ref 6.0–8.3)

## 2024-07-01 LAB — GAMMA GT: GGT: 279 U/L — ABNORMAL HIGH (ref 7–51)

## 2024-07-01 LAB — TSH: TSH: 4.37 u[IU]/mL (ref 0.35–5.50)

## 2024-07-01 LAB — VITAMIN D 25 HYDROXY (VIT D DEFICIENCY, FRACTURES): VITD: 46.67 ng/mL (ref 30.00–100.00)

## 2024-07-01 LAB — HEMOGLOBIN A1C: Hgb A1c MFr Bld: 5.8 % (ref 4.6–6.5)

## 2024-07-01 LAB — PROTIME-INR
INR: 1.3 ratio — ABNORMAL HIGH (ref 0.8–1.0)
Prothrombin Time: 13.8 s — ABNORMAL HIGH (ref 9.6–13.1)

## 2024-07-01 NOTE — Progress Notes (Signed)
 Subjective:  Patient ID: William JONETTA Forget, MD, male    DOB: 08/30/1951  Age: 73 y.o. MRN: [id number]  CC: Hospitalization Follow-up [facility]. Patient states that he was doing yard work and a tree limb fell and hit him in the head. He was rushed to the hospital by EMS. He stayed for a total of 4 days. ), Anemia, and Hypothyroidism   HPI William JONETTA Forget, MD presents for f/up ----  Discussed the use of AI scribe software for clinical note transcription with the patient, who gave verbal consent to proceed.  History of Present Illness William JONETTA Forget, MD [name] is a 73 year old male who presents for follow-up after a head injury.  In July, [place]. He did not lose consciousness and contacted EMS immediately. He was transported to [facility], where he was found to have a large scalp laceration, nondisplaced cervical spine fractures at two or three levels, a small venous subdural hematoma, and a small subarachnoid venous hematoma. He was discharged home four days later.  Following discharge, he developed diplopia and was diagnosed with cranial sixth nerve palsy. He is currently under the care of a neuro-rehabilitation specialist for decreased memory post-trauma, experiencing difficulty remembering details of stories, although his friends have not noticed any cognitive deficits.  He experiences mild frontal headaches post-injury, managed with Tylenol  and tramadol . He is weaning off tramadol , currently taking 25 mg twice a day. He has not experienced any adrenal insufficiency symptoms after stopping steroids. He continues to wear a cervical collar, which he has been using for four weeks, with a follow-up scheduled for neck imaging and neurosurgery consultation.  He has a history of primary biliary cholangitis and was in the process of weaning off prednisone  when the head  injury occurred. Labs related to this condition were ordered in June but were not completed due to the injury. He is considering completing these labs, which include vitamin levels and liver function tests.  He reports sleeping well despite wearing the cervical collar, aided by taking 10 mg of Sonata  nightly. No dizziness, balance issues, or pain and itching from the scabs on his head. He is able to ambulate safely as long as he is cautious.     Outpatient Medications Prior to Visit  Medication Sig Dispense Refill   acetaminophen  (TYLENOL ) 500 MG tablet Take 1,000 mg by mouth as needed for headache.     atorvastatin  (LIPITOR) 20 MG tablet TAKE 1 TABLET DAILY 90 tablet 3   cetirizine (ZYRTEC) 10 MG tablet Take 10 mg by mouth daily.     levothyroxine  (SYNTHROID ) 112 MCG tablet Take 1 tablet (112 mcg total) by mouth daily. 90 tablet 1   polyethylene glycol powder (GLYCOLAX/MIRALAX) 17 GM/SCOOP powder Take 1 Container by mouth once.     sertraline  (ZOLOFT ) 50 MG tablet TAKE 1 TABLET TWICE A DAY 180 tablet 3   traMADol  (ULTRAM ) 50 MG tablet Take 1 tablet (50 mg total) by mouth every 6 (six) hours as needed. 120 tablet 0   ursodiol  (ACTIGALL ) 250 MG tablet TAKE 4 TABLETS BY MOUTH DAILY 360 tablet 1   zaleplon  (SONATA ) 10 MG capsule Take 1 capsule (10 mg total) by mouth at bedtime as needed for sleep. 50 capsule 0   No facility-administered medications prior to visit.    ROS  Review of Systems  Constitutional:  Negative for appetite change, chills, diaphoresis, fatigue and fever.  HENT: Negative.  Negative for trouble swallowing.   Eyes: Negative.   Respiratory: Negative.  Negative for cough, chest tightness, shortness of breath and wheezing.   Cardiovascular:  Negative for chest pain, palpitations and leg swelling.  Gastrointestinal: Negative.  Negative for abdominal pain, constipation, diarrhea, nausea and vomiting.  Endocrine: Negative for cold intolerance and heat intolerance.   Genitourinary: Negative.  Negative for difficulty urinating.  Musculoskeletal: Negative.  Negative for arthralgias and myalgias.  Skin: Negative.   Neurological:  Positive for headaches.  Hematological:  Negative for adenopathy. Does not bruise/bleed easily.  Psychiatric/Behavioral:  Positive for decreased concentration and sleep disturbance. Negative for confusion. The patient is nervous/anxious.     Objective:  BP 128/68 (BP Location: Left Arm, Patient Position: Sitting, Cuff Size: Normal)   Pulse 77   Temp 98.1 F (36.7 C) (Oral)   Resp 16   Ht 5' 8 (1.727 m)   Wt 147 lb 6.4 oz (66.9 kg)   SpO2 99%   BMI 22.41 kg/m   BP Readings from Last 3 Encounters:  07/01/24 128/68  05/17/24 126/64  01/06/24 116/63    Wt Readings from Last 3 Encounters:  07/01/24 147 lb 6.4 oz (66.9 kg)  05/17/24 148 lb 12.8 oz (67.5 kg)  05/04/24 147 lb (66.7 kg)    Physical Exam Vitals reviewed.  HENT:     Mouth/Throat:     Mouth: Mucous membranes are moist.  Eyes:     General: No scleral icterus.    Conjunctiva/sclera: Conjunctivae normal.  Neck:     Comments: Aspen collar in place Cardiovascular:     Rate and Rhythm: Normal rate and regular rhythm.     Pulses: Normal pulses.     Heart sounds: No murmur heard.    No friction rub. No gallop.  Pulmonary:     Effort: Pulmonary effort is normal.     Breath sounds: No stridor. No wheezing, rhonchi or rales.  Abdominal:     General: Abdomen is flat.     Palpations: There is no mass.     Tenderness: There is no abdominal tenderness. There is no guarding.     Hernia: No hernia is present.  Musculoskeletal:        General: Normal range of motion.     Cervical back: Neck supple.     Right lower leg: No edema.     Left lower leg: No edema.  Lymphadenopathy:     Cervical: No cervical adenopathy.  Skin:    General: Skin is warm and dry.     Coloration: Skin is pale.     Findings: No rash.  Neurological:     General: No focal deficit  present.     Mental Status: He is alert. Mental status is at baseline.     Lab Results  Component Value Date   WBC 7.6 07/01/2024   HGB 10.8 (L) 07/01/2024   HCT 32.6 (L) 07/01/2024   PLT 402.0 (H) 07/01/2024   GLUCOSE 87 07/01/2024   CHOL 125 07/01/2024   TRIG 67.0 07/01/2024   HDL 42.10 07/01/2024   LDLDIRECT 134.5 12/15/2006   LDLCALC 70 07/01/2024   ALT 14 07/01/2024   AST 22 07/01/2024   NA 139 07/01/2024   K 4.2 07/01/2024   CL 100 07/01/2024   CREATININE 0.84 07/01/2024   BUN 16 07/01/2024   CO2 31 07/01/2024   TSH 4.37  07/01/2024   PSA 0.9 02/25/2024   INR 1.3 (H) 07/01/2024   HGBA1C 5.8 07/01/2024    MR ABDOMEN WWO CONTRAST Result Date: 05/02/2023 CLINICAL DATA:  73 year old male with history of pancreatic cyst. History of squamous cell carcinoma of the tongue in 2012. EXAM: MRI ABDOMEN WITHOUT AND WITH CONTRAST TECHNIQUE: Multiplanar multisequence MR imaging of the abdomen was performed both before and after the administration of intravenous contrast. CONTRAST:  7 mL of Vueway . COMPARISON:  No prior abdominal MRI. FINDINGS: Lower chest: Unremarkable. Hepatobiliary: There are several small T1 hypointense, T2 hyperintense, nonenhancing lesions in the liver measuring up to 1.1 cm in diameter, compatible with tiny cysts and/or biliary hamartomas (benign, requiring no imaging follow-up). No other aggressive appearing hepatic lesions. Status post cholecystectomy. MRCP images demonstrate no intrahepatic biliary ductal dilatation. Common bile duct is slightly prominent measuring 7 mm, within normal limits for the patient's age and post cholecystectomy status. There are no filling defects within the common bile duct to suggest choledocholithiasis. Pancreas: A few scattered tiny T1 hypointense, T2 hyperintense, nonenhancing lesions are noted in the pancreas, largest of which is in the body of the pancreas measuring only 4 mm (axial image 19 of series 4). These do not appear to  communicate with the main pancreatic duct. MRCP images demonstrate no dilatation of the main pancreatic duct. No solid enhancing pancreatic neoplasm noted. No peripancreatic fluid collections or inflammatory changes. Spleen:  Unremarkable. Adrenals/Urinary Tract: Multiple T1 hypointense, T2 hyperintense, nonenhancing lesions in the left kidney, compatible with simple cysts (Bosniak class 1, no imaging follow-up recommended), largest of which is partially exophytic in the lateral aspect of the interpolar region measuring 3.9 cm (axial image 29 of series 4). No other suspicious renal lesions. Right kidney and bilateral adrenal glands are normal in appearance. No hydroureteronephrosis in the visualized portions of the abdomen. Stomach/Bowel: Visualized portions are unremarkable. Vascular/Lymphatic: No aneurysm identified in the visualized abdominal vasculature. Retroaortic left renal vein (normal anatomical variant) incidentally noted. No lymphadenopathy noted in the abdomen. Other: No significant volume of ascites noted in the visualized portions of the peritoneal cavity. Musculoskeletal: No aggressive appearing osseous lesions are noted in the visualized portions of the skeleton. IMPRESSION: 1. 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 Repeat abdominal MRI with and without IV gadolinium with MRCP is recommended in 2 years to ensure stability. This recommendation follows ACR consensus guidelines: Management of Incidental Pancreatic Cysts: A White Paper of the ACR Incidental Findings Committee. J Am Coll Radiol 2017;14:911-923. 2. Additional incidental findings, as above. Electronically Signed   By: Toribio Aye M.D.   On: 05/02/2023 11:08    Assessment & Plan:  Primary biliary cholangitis (HCC) -     VITAMIN D  25 Hydroxy (Vit-D Deficiency, Fractures);  Future -     Vitamin A ; Future -     Hepatic function panel; Future -     CBC with Differential/Platelet; Future -     Protime-INR -     Gamma GT  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 atrophy of thyroid  -     CBC with Differential/Platelet; Future -     TSH  Prediabetes -     Hemoglobin A1c -     Basic metabolic panel with GFR  Hyperlipidemia LDL goal <130 -     TSH -     Lipid panel  Chronic renal disease, stage 2, mildly  decreased glomerular filtration rate (GFR) between 60-89 mL/min/1.73 square meter -     Basic metabolic panel with GFR  Deficiency anemia- Will evaluate for vitamin deficiencies. -     IBC + Ferritin; Future -     Reticulocytes; Future -     Zinc ; Future -     Vitamin B1; Future -     Methylmalonic acid, serum; Future -     Folate; Future -     Vitamin B12; Future     Follow-up: Return in about 3 months (around 10/01/2024).  Debby Molt, MD

## 2024-07-01 NOTE — Patient Instructions (Signed)

## 2024-07-02 ENCOUNTER — Other Ambulatory Visit (INDEPENDENT_AMBULATORY_CARE_PROVIDER_SITE_OTHER)

## 2024-07-02 DIAGNOSIS — D539 Nutritional anemia, unspecified: Secondary | ICD-10-CM

## 2024-07-02 LAB — IBC + FERRITIN
Ferritin: 98.6 ng/mL (ref 22.0–322.0)
Iron: 25 ug/dL — ABNORMAL LOW (ref 42–165)
Saturation Ratios: 8.2 % — ABNORMAL LOW (ref 20.0–50.0)
TIBC: 306.6 ug/dL (ref 250.0–450.0)
Transferrin: 219 mg/dL (ref 212.0–360.0)

## 2024-07-02 LAB — VITAMIN B12: Vitamin B-12: 419 pg/mL (ref 211–911)

## 2024-07-02 LAB — FOLATE: Folate: 15.9 ng/mL (ref >5.9)

## 2024-07-05 ENCOUNTER — Ambulatory Visit

## 2024-07-05 DIAGNOSIS — R41841 Cognitive communication deficit: Secondary | ICD-10-CM | POA: Diagnosis not present

## 2024-07-05 NOTE — Therapy (Signed)
 OUTPATIENT SPEECH LANGUAGE PATHOLOGY TREATMENT   Patient Name: William ELGERSMA, MD MRN: [id number] DOB:November 06, 1951, 73 y.o., male Today's Date: 07/05/2024  PCP: William Molt, MD REFERRING PROVIDER: Leverette Jerrell PARAS., PA-C  END OF SESSION:  End of Session - 07/05/24 0847     Visit Number 3    Number of Visits 9    Date for SLP Re-Evaluation 08/13/24   first ST 06-22-24   SLP Start Time 0847    SLP Stop Time  0930    SLP Time Calculation (min) 43 min    Activity Tolerance Patient tolerated treatment well          Past Medical History:  Diagnosis Date   Adenomatous colon polyp    2003   ANEMIA, IRON DEFICIENCY    mild in the past   BPH (benign prostatic hyperplasia)    Cataract    CERVICAL RADICULOPATHY    disectomy in 1990s   DDD (degenerative disc disease), 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 '12   L4-5, ESI therapy successful 10/12; L5-S1 radiculopathy 3/13   Fundic gland polyps of stomach, benign    GERD    EGD negative x 3, off medication (July '13)   Hearing loss in left ear    HYPERLIPIDEMIA, MILD    lipitor 10mg     Hypothyroidism    Liver cyst    Primary biliary cholangitis (HCC) 09/18/2018   Renal cyst    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[place])   mid-tongue squamous cell - excised, clean margins, 11 nodes negative, no adjuvant therapy   TRANSAMINASES, SERUM, ELEVATED    Past Surgical History:  Procedure Laterality Date   BACK SURGERY  '90s ([place])   CERVICAL DISC - diskectomy w/ fusion, anterior   CHOLECYSTECTOMY N/A 01/05/2021   Procedure: LAPAROSCOPIC CHOLECYSTECTOMY WITH INTRAOPERATIVE CHOLANGIOGRAM;  Surgeon: William Cough, MD;  Location: [facility];  Service: General;  Laterality: N/A;   COLONOSCOPY     Multiple   ESOPHAGOGASTRODUODENOSCOPY     Multiple   HEMIGLOSSECTOMY  10/30/2011   Procedure: HEMIGLOSSECTOMY;  Surgeon: William Luna;  Location: [facility] OR;  Service: ENT;  Laterality: Right;  Right tongue resection   LUMBAR LAMINECTOMY/DECOMPRESSION MICRODISCECTOMY   01/29/2012   Procedure: LUMBAR LAMINECTOMY/DECOMPRESSION MICRODISCECTOMY;  Surgeon: William Gens, MD;  Location: [facility];  Service: Neurosurgery;  Laterality: Left;  Left L5-S1 Microdiskectomy   LUMBAR LAMINECTOMY/DECOMPRESSION MICRODISCECTOMY  07/13/2012   Procedure: LUMBAR LAMINECTOMY/DECOMPRESSION MICRODISCECTOMY 1 LEVEL;  Surgeon: William Gens, MD;  Location: [facility];  Service: Neurosurgery;  Laterality: Left;  Redo Left Lumbar five-sacral one Microdiskectomy   nail avulsion     right first finger due to a wart   RADICAL NECK DISSECTION  10/30/2011   Procedure: RADICAL NECK DISSECTION;  Surgeon: William Luna;  Location: [facility] OR;  Service: ENT;  Laterality: Right;   Patient Active Problem List   Diagnosis Date Noted   Essential thrombocytosis (HCC) 07/01/2024   Screening for ischemic heart disease 05/17/2024   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, right 05/17/2024   Rotator cuff tendinitis, right 07/16/2023   Chronic renal disease, stage 2, mildly decreased glomerular filtration rate (GFR) between 60-89 mL/min/1.73 square meter 12/12/2022   Insomnia due to anxiety and fear 09/05/2022   Primary biliary cholangitis (HCC) 09/18/2018   Deficiency anemia 05/27/2018   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 atrophy of thyroid  10/01/2016   Benign prostatic hyperplasia without lower urinary tract symptoms 10/01/2016   Prediabetes 10/09/2015   Squamous cell cancer of tongue (  HCC)    Hyperlipidemia LDL goal <130 11/17/2008   Obsessive behaviors 11/17/2008   GERD 11/17/2008   Degenerative joint disease (DJD) of lumbar spine 11/17/2008   CERVICAL RADICULOPATHY 11/17/2008    ONSET DATE: 05/30/24   REFERRING DIAG: U92.XXXA (ICD-10-CM) - Unspecified multiple injuries, initial encounter T14.90XA (ICD-10-CM) - Injury, unspecified, initial encounter  THERAPY DIAG:  Cognitive communication deficit  Rationale for Evaluation and Treatment: Rehabilitation  SUBJECTIVE:   SUBJECTIVE  STATEMENT: I have completely gotten off of all pain meds.  Pt accompanied by: significant other Wife William Luna  PERTINENT HISTORY: HLD, GERD, BPH, Hypothyrodism, Primary biliary cholangitis, CKD, prediabetes and PSH: ACDF C4-5   PAIN:  Are you having pain? Did not ask today  FALLS: Has patient fallen in last 6 months?  Yes, Number of falls: one, at time of event  LIVING ENVIRONMENT: Lives with: lives with their spouse Lives in: House/apartment  PLOF:  Level of assistance: Independent with ADLs, Independent with IADLs Employment: Retired cardiologist  PATIENT GOALS: Inquire whether skilled ST would benefit him at this time  OBJECTIVE:  Note: Objective measures were completed at Evaluation unless otherwise noted.  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 On clock drawing pt did not follow instructions. SLP provided the same task with more detailed instructions and pt performance still vastly abnormal. William Luna may have some mild alternating or divided attention deficits due to stating If I get busy with something I may miss my (pain medication administration) time so pt adjusts subsequent administration times. SLP suggested William Luna set alarms for these times. SLP postulates pt may have deficit in error awareness and/or deficit in attention to detail based on performance on clock drawing task.  Behavior: Poor frustration tolerance. Initially pt appeared to explain away deficits. After wife stated she was disturbed with pt's performance with clock drawing, pt no longer attempted to explain deficits away.  William Luna stated pt demonstrated mildly decr'd memory, pt did not mention this to SLP. This may indicate an awareness deficit (vs an aspect of pt's premorbid personality - possible  difficulty admitting errors). As SLP and pt cont to work together it may be revealed that what might be considered deficits at this time are actually aspects of pt's personality. William Luna indicated she thought pt's memory was not as sharp as prior to TBI incident. This could be more related to pt's attention (or auditory comprehension) post-TBI. Pt did say fish x2 when performing animal naming on SLUMS. William Luna indicated that prior to TBI, he thought it may be his baseline that recall of details of medical journals was more difficult for him.  COGNITIVE COMMUNICATION: Following directions: Follows multi-step commands inconsistently  Auditory comprehension: Impaired: vs decr'd attention. Difficult to ascertain at this time. More testing may be necessary Verbal expression: WFL Functional communication: WFL  ORAL MOTOR EXAMINATION: Overall status: WFL  STANDARDIZED ASSESSMENTS:  SLUMS: 26/30 (where >/= 27/30 is WNL)   [employer] Mental Status (SLUMS)  Orientation: 3/3 Delayed Recall w/ Interference: 5/5 Numeric Calculation and Registration: 3/3 Immediate Recall w/ Interference (Generative naming): 3/3 Registration and Digit Span: 2/2 Visual Spatial/Exec Functioning: 2/6 Executive Functioning/Extrapolation: 8/8  PATIENT REPORTED OUTCOME MEASURES (PROM): Cognitive Function: PROMs were returned on 07/05/24.William Luna could not answer some questions due to unsure of how William Luna is responding in those situations on the questionnaire. For this reason SLP  will not take William Luna's score into consideration. William Luna scored himself 134/140 with an amended score. Initially, pt scored himself 129/140. (Most likely, a difference of 5 is not statistically significant) Higher scores indicate less impact on pt's QoL and/or ADLs.Jeff's amended scores indicate a score of 4 with remembering where things were placed, remembering a list of 4-5 errands, trouble remembering whether he did things he was supposed to do (e.g.,  medication, etc), remembering new information like phone numbers or simple instructions, walking into a room and forgetting what I meant to do there, and trouble remembering the name of a familiar person.                                                                                                                            TREATMENT DATE:   07/05/24: SLP to ask about timing of med administration next session. Pt reviewed medical records back to 2003 regarding hemoglobin anomaly. Discussed this with a MD friend. Jeff/William Luna did a 11 minute Ted Talk and with William Luna writing down details, recalled details more accurately than William Luna. Today SLP worked with pt's attention in detailed auditory instructions. Pt confirmed instructions 2/6, SLP affirmed this was a good strategy if pt questions his accuracy in recall. William Luna req'd min A from SLP for one error. Discussion with William Luna and William Luna to be as accurate and truthful as possible with pt behavior considering personality vs true deficits. SLP suggested pt and wife keep open and understanding communication around this topic for pt's benefit. William Luna confer SLP for assistance today. He and William Luna will listen to two 20-minute (>18 minutes) Ted Talks before next visit.  06/28/24: SLP used some subtests from Cognitive Linguistic Quick Test (CLQT) to gather more information about areas where pt might show deficits, in order to hone ST treatment plan. In some subtests targeting attention and attention to detail (symbol trails, generative naming, mazes, story retelling), pt demonstrated difficulty with following and recalling auditory details. Practically at home, William Luna is seeing some evidence of decr'd auditory memory and/or attention. 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 The extent to which this condition affected pt's performance today is unknown, however it should be considered when  presenting pt visual information during evaluation and therapy tasks. SLP will complete CLQT next session and will report his scores at that time. If pt desires more complete and precise information regarding cognition a neuropsychological evaluation should be considered which uses a greater number of assessments with narrowed diagnostic foci. Pt also wears hearing aids and these were placed for therapy today. SLP suggested pt should complete practical auditory attention tasks such as listen to/watch 10-20 minutes auditory information (YouTube, [employer], podcasts) and take notes to provide synopsis with William Luna. Pt should pause video/audio and take breaks if/when he feels/thinks his brain feels taxed.  06/17/24: n/a  PATIENT EDUCATION: Education details: see treatment date  for today's date Person educated: Patient and Spouse Education method: Explanation and Demonstration Education comprehension: verbalized understanding and needs further education   GOALS: Goals reviewed with patient? No  SHORT TERM GOALS: Target date: 07/16/24  Pt will demo ability to attend to detail in functional novel mod complex cognitive linguistic tasks in 2 sessions Baseline: Goal status: INITIAL  2.  Pt will complete novel homework tasks with 100% success given mod I (compensations) in 2 sessions  Baseline:  Goal status: INITIAL  3.  Pt will use trained memory strategies successfully with medication administration for 2 weeks with accuracy at least 95% re: timing Baseline:  Goal status: INITIAL   LONG TERM GOALS: Target date: 08/13/24  Pt will improve PROM from initial administration Baseline:  Goal status: INITIAL  2.  Pt will demo ability to attend to detail in functional novel mod complex cognitive linguistic tasks in 2 sessions Baseline:  Goal status: INITIAL  3.  Pt will successfully and independently complete daily tasks using trained memory strategies PRN  Baseline:  Goal status:  INITIAL   ASSESSMENT:  CLINICAL IMPRESSION: Patient is a 73 y.o. M who was seen today for ongoing treatment of cognitive linguistics. SLP to keep current goals. Pt continues to work at home with daily tasks and homework; Pt very motivated for positive improvement. See treatment date above for today's date for further details on today's session. He would cont to benefit from skilled ST for rehab for cognitive deficits post TBI.   OBJECTIVE IMPAIRMENTS: Possibly include attention, memory, awareness, executive functioning, and /or auditory processing and comprehension. These impairments are limiting patient from managing medications, household responsibilities, ADLs/IADLs, and effectively communicating at home and in community. Factors affecting potential to achieve goals and functional outcome are cooperation/participation level due to initial response of pt to explain away deficits. Patient will benefit from skilled SLP services to address above impairments and improve overall function.  REHAB POTENTIAL: Excellent  PLAN:  SLP FREQUENCY: 1x/week  SLP DURATION: 8 weeks  PLANNED INTERVENTIONS: Environmental controls, Cueing hierachy, Cognitive reorganization, Internal/external aids, Functional tasks, SLP instruction and feedback, Compensatory strategies, Patient/family education, and [id number] Treatment of speech (30 or 45 min)     Jevon Shells, CCC-SLP 07/05/2024, 8:47 AM

## 2024-07-06 ENCOUNTER — Other Ambulatory Visit: Payer: Self-pay | Admitting: Internal Medicine

## 2024-07-06 ENCOUNTER — Ambulatory Visit: Payer: Self-pay | Admitting: Internal Medicine

## 2024-07-06 ENCOUNTER — Other Ambulatory Visit

## 2024-07-06 DIAGNOSIS — D500 Iron deficiency anemia secondary to blood loss (chronic): Secondary | ICD-10-CM | POA: Diagnosis not present

## 2024-07-06 LAB — CBC WITH DIFFERENTIAL/PLATELET
Basophils Absolute: 0 K/uL (ref 0.0–0.1)
Basophils Relative: 0.3 % (ref 0.0–3.0)
Eosinophils Absolute: 0.1 K/uL (ref 0.0–0.7)
Eosinophils Relative: 1 % (ref 0.0–5.0)
HCT: 33.7 % — ABNORMAL LOW (ref 39.0–52.0)
Hemoglobin: 11 g/dL — ABNORMAL LOW (ref 13.0–17.0)
Lymphocytes Relative: 15.4 % (ref 12.0–46.0)
Lymphs Abs: 1.4 K/uL (ref 0.7–4.0)
MCHC: 32.8 g/dL (ref 30.0–36.0)
MCV: 85.1 fl (ref 78.0–100.0)
Monocytes Absolute: 0.7 K/uL (ref 0.1–1.0)
Monocytes Relative: 7.7 % (ref 3.0–12.0)
Neutro Abs: 7.1 K/uL (ref 1.4–7.7)
Neutrophils Relative %: 75.6 % (ref 43.0–77.0)
Platelets: 422 K/uL — ABNORMAL HIGH (ref 150.0–400.0)
RBC: 3.96 Mil/uL — ABNORMAL LOW (ref 4.22–5.81)
RDW: 14.1 % (ref 11.5–15.5)
WBC: 9.4 K/uL (ref 4.0–10.5)

## 2024-07-06 LAB — METHYLMALONIC ACID, SERUM: Methylmalonic Acid, Quant: 165 nmol/L (ref 69–390)

## 2024-07-06 LAB — VITAMIN A: Vitamin A (Retinoic Acid): 49 ug/dL (ref 38–98)

## 2024-07-06 MED ORDER — ACCRUFER 30 MG PO CAPS: 1.0000 | ORAL_CAPSULE | Freq: Two times a day (BID) | ORAL | 0 refills | Status: DC

## 2024-07-07 LAB — ZINC

## 2024-07-07 LAB — EXTRA SPECIMEN

## 2024-07-07 LAB — VITAMIN B1: Vitamin B1 (Thiamine): 8 nmol/L (ref 8–30)

## 2024-07-07 LAB — RETICULOCYTES
ABS Retic: 65280 {cells}/uL (ref 25000–90000)
Retic Ct Pct: 1.7 %

## 2024-07-13 ENCOUNTER — Ambulatory Visit

## 2024-07-13 ENCOUNTER — Ambulatory Visit: Payer: Self-pay | Admitting: Internal Medicine

## 2024-07-13 ENCOUNTER — Other Ambulatory Visit (INDEPENDENT_AMBULATORY_CARE_PROVIDER_SITE_OTHER)

## 2024-07-13 DIAGNOSIS — D500 Iron deficiency anemia secondary to blood loss (chronic): Secondary | ICD-10-CM | POA: Diagnosis not present

## 2024-07-13 DIAGNOSIS — R41841 Cognitive communication deficit: Secondary | ICD-10-CM

## 2024-07-13 LAB — HEMOCCULT SLIDES (X 3 CARDS)
Fecal Occult Blood: NEGATIVE
OCCULT 1: NEGATIVE
OCCULT 2: NEGATIVE
OCCULT 3: NEGATIVE
OCCULT 4: NEGATIVE
OCCULT 5: NEGATIVE

## 2024-07-13 NOTE — Therapy (Signed)
 OUTPATIENT SPEECH LANGUAGE PATHOLOGY TREATMENT   Patient Name: William TAILOR, MD MRN: [id number] DOB:10-Sep-1951, 73 y.o., male Today's Date: 07/13/2024  PCP: Charlena Molt, MD REFERRING PROVIDER: Leverette Jerrell PARAS., PA-C  END OF SESSION:  End of Session - 07/13/24 1316     Visit Number 4    Number of Visits 9    Date for SLP Re-Evaluation 08/13/24   first ST 06-22-24   SLP Start Time 1318    SLP Stop Time  1400    SLP Time Calculation (min) 42 min    Activity Tolerance Patient tolerated treatment well          Past Medical History:  Diagnosis Date   Adenomatous colon polyp    2003   ANEMIA, IRON DEFICIENCY    mild in the past   BPH (benign prostatic hyperplasia)    Cataract    CERVICAL RADICULOPATHY    disectomy in 1990s   DDD (degenerative disc disease), lumbar oct '12   L4-5, ESI therapy successful 10/12; L5-S1 radiculopathy 3/13   Fundic gland polyps of stomach, benign    GERD    EGD negative x 3, off medication (July '13)   Hearing loss in left ear    HYPERLIPIDEMIA, MILD    lipitor 10mg     Hypothyroidism    Liver cyst    Primary biliary cholangitis (HCC) 09/18/2018   Renal cyst    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[place])   mid-tongue squamous cell - excised, clean margins, 11 nodes negative, no adjuvant therapy   TRANSAMINASES, SERUM, ELEVATED    Past Surgical History:  Procedure Laterality Date   BACK SURGERY  '90s ([place])   CERVICAL DISC - diskectomy w/ fusion, anterior   CHOLECYSTECTOMY N/A 01/05/2021   Procedure: LAPAROSCOPIC CHOLECYSTECTOMY WITH INTRAOPERATIVE CHOLANGIOGRAM;  Surgeon: Gladis Cough, MD;  Location: [facility];  Service: General;  Laterality: N/A;   COLONOSCOPY     Multiple   ESOPHAGOGASTRODUODENOSCOPY     Multiple   HEMIGLOSSECTOMY  10/30/2011   Procedure: HEMIGLOSSECTOMY;  Surgeon: Norleen CHRISTELLA Notice;  Location: [facility];  Service: ENT;  Laterality: Right;  Right tongue resection   LUMBAR LAMINECTOMY/DECOMPRESSION MICRODISCECTOMY   01/29/2012   Procedure: LUMBAR LAMINECTOMY/DECOMPRESSION MICRODISCECTOMY;  Surgeon: Victory Gens, MD;  Location: [facility];  Service: Neurosurgery;  Laterality: Left;  Left L5-S1 Microdiskectomy   LUMBAR LAMINECTOMY/DECOMPRESSION MICRODISCECTOMY  07/13/2012   Procedure: LUMBAR LAMINECTOMY/DECOMPRESSION MICRODISCECTOMY 1 LEVEL;  Surgeon: Victory Gens, MD;  Location: [facility];  Service: Neurosurgery;  Laterality: Left;  Redo Left Lumbar five-sacral one Microdiskectomy   nail avulsion     right first finger due to a wart   RADICAL NECK DISSECTION  10/30/2011   Procedure: RADICAL NECK DISSECTION;  Surgeon: Norleen CHRISTELLA Notice;  Location: [facility];  Service: ENT;  Laterality: Right;   Patient Active Problem List   Diagnosis Date Noted   Iron deficiency anemia due to chronic blood loss 07/06/2024   Essential thrombocytosis (HCC) 07/01/2024   Screening for ischemic heart disease 05/17/2024   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, right 05/17/2024   Rotator cuff tendinitis, right 07/16/2023   Chronic renal disease, stage 2, mildly decreased glomerular filtration rate (GFR) between 60-89 mL/min/1.73 square meter 12/12/2022   Insomnia due to anxiety and fear 09/05/2022   Primary biliary cholangitis (HCC) 09/18/2018   Deficiency anemia 05/27/2018   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 atrophy of thyroid  10/01/2016   Benign prostatic hyperplasia without lower urinary tract symptoms 10/01/2016  Prediabetes 10/09/2015   Squamous cell cancer of tongue (HCC)    Hyperlipidemia LDL goal <130 11/17/2008   Obsessive behaviors 11/17/2008   GERD 11/17/2008   Degenerative joint disease (DJD) of lumbar spine 11/17/2008   CERVICAL RADICULOPATHY 11/17/2008    ONSET DATE: 05/30/24   REFERRING DIAG: U92.XXXA (ICD-10-CM) - Unspecified multiple injuries, initial encounter T14.90XA (ICD-10-CM) - Injury, unspecified, initial encounter  THERAPY DIAG:  Cognitive communication deficit  Rationale for Evaluation and  Treatment: Rehabilitation  SUBJECTIVE:   SUBJECTIVE STATEMENT: [name] reports today that his double vision appears to be improving.   Pt accompanied by: self   PERTINENT HISTORY: HLD, GERD, BPH, Hypothyrodism, Primary biliary cholangitis, CKD, prediabetes and PSH: ACDF C4-5   PAIN:  Are you having pain? Did not ask today  FALLS: Has patient fallen in last 6 months?  Yes, Number of falls: one, at time of event   PATIENT GOALS: Inquire whether skilled ST would benefit him at this time  OBJECTIVE:  Note: Objective measures were completed at Evaluation unless otherwise noted.   PATIENT REPORTED OUTCOME MEASURES (PROM): Cognitive Function: PROMs were returned on 07/05/24.[name] could not answer some questions due to unsure of how [name] is responding in those situations on the questionnaire. For this reason SLP will not take [name]'s score into consideration. [name] scored himself 134/140 with an amended score. Initially, pt scored himself 129/140. (Most likely, a difference of 5 is not statistically significant) Higher scores indicate less impact on pt's QoL and/or ADLs.Jeff's amended scores indicate a score of 4 with remembering where things were placed, remembering a list of 4-5 errands, trouble remembering whether he did things he was supposed to do (e.g., medication, etc), remembering new information like phone numbers or simple instructions, walking into a room and forgetting what I meant to do there, and trouble remembering the name of a familiar person.                                                                                                                            TREATMENT DATE:   07/13/24: SLP TO ASK ABOUT MED TIMING NEXT SESSION. Pt schedule last week did not lend itself to complete homework wth wife. He described a task he did since last session. 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 Today SLP read word problems to pt and he wrote notes and answered questions using a calculator with  100% success. He double checked answers. SLP conferred with pt and they agreed once every two weeks was sufficient for remaining ST.   07/05/24: SLP to ask about timing of med administration next session. Pt reviewed medical records back to 2003 regarding hemoglobin anomaly. Discussed this with a MD friend. Jeff/[name] did a 11 minute Ted Talk and with [name] writing down details, recalled details more accurately than [name]. Today SLP  worked with pt's attention in detailed auditory instructions. Pt confirmed instructions 2/6, SLP affirmed this was a good strategy if pt questions his accuracy in recall. [name] req'd min A from SLP for one error. Discussion with [name] and [name] to be as accurate and truthful as possible with pt behavior considering personality vs true deficits. SLP suggested pt and wife keep open and understanding communication around this topic for pt's benefit. [name] confer SLP for assistance today. He and [name] will listen to two 20-minute (>18 minutes) Ted Talks before next visit.  06/28/24: SLP used some subtests from Cognitive Linguistic Quick Test (CLQT) to gather more information about areas where pt might show deficits, in order to hone ST treatment plan. In some subtests targeting attention and attention to detail (symbol trails, generative naming, mazes, story retelling), pt demonstrated difficulty with following and recalling auditory details. Practically at home, [name] is seeing some evidence of decr'd auditory memory and/or attention. 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 The extent to which this condition affected pt's performance today is unknown, however it should be considered when presenting pt visual information during evaluation and therapy tasks. SLP  will complete CLQT next session and will report his scores at that time. If pt desires more complete and precise information regarding cognition a neuropsychological evaluation should be considered which uses a greater number of assessments with narrowed diagnostic foci. Pt also wears hearing aids and these were placed for therapy today. SLP suggested pt should complete practical auditory attention tasks such as listen to/watch 10-20 minutes auditory information (YouTube, [employer], podcasts) and take notes to provide synopsis with [name]. Pt should pause video/audio and take breaks if/when he feels/thinks his brain feels taxed.  06/17/24: n/a  PATIENT EDUCATION: Education details: see treatment date for today's date Person educated: Patient Education method: Explanation Education comprehension: verbalized understanding   GOALS: Goals reviewed with patient? No  SHORT TERM GOALS: Target date: , 07/31/24 (due to visit count)  Pt will demo ability to attend to detail in functional novel mod complex cognitive linguistic tasks in 2 sessions Baseline: 07/13/24 Goal status: INITIAL  2.  Pt will complete novel homework tasks with 100% success given mod I (compensations) in 2 sessions  Baseline:  07/05/24 Goal status: INITIAL  3.  Pt will use trained memory strategies successfully with medication administration for 2 weeks with accuracy at least 95% re: timing Baseline:  Goal status: INITIAL   LONG TERM GOALS: Target date: 08/13/24  Pt will improve PROM from initial administration Baseline:  Goal status: INITIAL  2.  Pt will demo success with functional auditory mod complex cognitive linguistic tasks compensations (e.g. notes) allowed, in 2 sessions Baseline:  Goal status: INITIAL  3.  Pt will successfully and independently complete daily tasks using trained memory strategies PRN  Baseline:  Goal status: INITIAL   ASSESSMENT:  CLINICAL IMPRESSION: SLP modified LTG #2 to target  auditory information presented to pt. Patient is a 73 y.o. M who was seen today for ongoing treatment of cognitive linguistics. Pt continues to participate with detailed daily tasks at home with success. SLP to target more auditory tasks than general detailed tasks. Pt very motivated for positive improvement. See treatment date above for today's date for further details on today's session. He would cont to benefit from skilled ST for rehab for cognitive deficits post TBI.   OBJECTIVE IMPAIRMENTS: Possibly include attention,  memory, awareness, executive functioning, and /or auditory processing and comprehension. These impairments are limiting patient from managing medications, household responsibilities, ADLs/IADLs, and effectively communicating at home and in community. Factors affecting potential to achieve goals and functional outcome are cooperation/participation level due to initial response of pt to explain away deficits. Patient will benefit from skilled SLP services to address above impairments and improve overall function.  REHAB POTENTIAL: Excellent  PLAN:  SLP FREQUENCY: 1x/week  SLP DURATION: 8 weeks  PLANNED INTERVENTIONS: Environmental controls, Cueing hierachy, Cognitive reorganization, Internal/external aids, Functional tasks, SLP instruction and feedback, Compensatory strategies, Patient/family education, and [id number] Treatment of speech (30 or 45 min)     Murrel Freet, CCC-SLP 07/13/2024, 1:17 PM

## 2024-07-19 ENCOUNTER — Encounter: Payer: Self-pay | Admitting: Internal Medicine

## 2024-07-20 ENCOUNTER — Ambulatory Visit

## 2024-07-20 DIAGNOSIS — S12600D Unspecified displaced fracture of seventh cervical vertebra, subsequent encounter for fracture with routine healing: Secondary | ICD-10-CM | POA: Diagnosis not present

## 2024-07-20 DIAGNOSIS — S065X0D Traumatic subdural hemorrhage without loss of consciousness, subsequent encounter: Secondary | ICD-10-CM | POA: Diagnosis not present

## 2024-07-20 DIAGNOSIS — S066X0D Traumatic subarachnoid hemorrhage without loss of consciousness, subsequent encounter: Secondary | ICD-10-CM | POA: Insufficient documentation

## 2024-07-20 DIAGNOSIS — S065X0A Traumatic subdural hemorrhage without loss of consciousness, initial encounter: Secondary | ICD-10-CM | POA: Insufficient documentation

## 2024-07-20 DIAGNOSIS — S064X0D Epidural hemorrhage without loss of consciousness, subsequent encounter: Secondary | ICD-10-CM | POA: Diagnosis not present

## 2024-07-20 DIAGNOSIS — S064X0A Epidural hemorrhage without loss of consciousness, initial encounter: Secondary | ICD-10-CM | POA: Insufficient documentation

## 2024-07-22 ENCOUNTER — Other Ambulatory Visit: Payer: Self-pay | Admitting: Internal Medicine

## 2024-07-22 DIAGNOSIS — E034 Atrophy of thyroid (acquired): Secondary | ICD-10-CM

## 2024-07-22 DIAGNOSIS — S0285XD Fracture of orbit, unspecified, subsequent encounter for fracture with routine healing: Secondary | ICD-10-CM | POA: Diagnosis not present

## 2024-07-22 DIAGNOSIS — H4921 Sixth [abducent] nerve palsy, right eye: Secondary | ICD-10-CM | POA: Diagnosis not present

## 2024-07-23 ENCOUNTER — Other Ambulatory Visit

## 2024-07-23 ENCOUNTER — Other Ambulatory Visit: Payer: Self-pay | Admitting: Family

## 2024-07-23 DIAGNOSIS — R7989 Other specified abnormal findings of blood chemistry: Secondary | ICD-10-CM | POA: Diagnosis not present

## 2024-07-23 DIAGNOSIS — R749 Abnormal serum enzyme level, unspecified: Secondary | ICD-10-CM

## 2024-07-23 LAB — HEPATIC FUNCTION PANEL
ALT: 14 U/L (ref 0–53)
AST: 26 U/L (ref 0–37)
Albumin: 4.1 g/dL (ref 3.5–5.2)
Alkaline Phosphatase: 116 U/L (ref 39–117)
Bilirubin, Direct: 0 mg/dL (ref 0.0–0.3)
Total Bilirubin: 0.3 mg/dL (ref 0.2–1.2)
Total Protein: 7.6 g/dL (ref 6.0–8.3)

## 2024-07-23 LAB — CBC WITH DIFFERENTIAL/PLATELET
Basophils Absolute: 0 K/uL (ref 0.0–0.1)
Basophils Relative: 0.3 % (ref 0.0–3.0)
Eosinophils Absolute: 0.1 K/uL (ref 0.0–0.7)
Eosinophils Relative: 1.1 % (ref 0.0–5.0)
HCT: 34.6 % — ABNORMAL LOW (ref 39.0–52.0)
Hemoglobin: 11.1 g/dL — ABNORMAL LOW (ref 13.0–17.0)
Lymphocytes Relative: 22.6 % (ref 12.0–46.0)
Lymphs Abs: 1.5 K/uL (ref 0.7–4.0)
MCHC: 32.2 g/dL (ref 30.0–36.0)
MCV: 83.9 fl (ref 78.0–100.0)
Monocytes Absolute: 0.5 K/uL (ref 0.1–1.0)
Monocytes Relative: 7.3 % (ref 3.0–12.0)
Neutro Abs: 4.4 K/uL (ref 1.4–7.7)
Neutrophils Relative %: 68.7 % (ref 43.0–77.0)
Platelets: 254 K/uL (ref 150.0–400.0)
RBC: 4.12 Mil/uL — ABNORMAL LOW (ref 4.22–5.81)
RDW: 14.7 % (ref 11.5–15.5)
WBC: 6.4 K/uL (ref 4.0–10.5)

## 2024-07-23 LAB — GAMMA GT: GGT: 88 U/L — ABNORMAL HIGH (ref 7–51)

## 2024-07-26 ENCOUNTER — Other Ambulatory Visit: Payer: Self-pay

## 2024-07-26 DIAGNOSIS — I83811 Varicose veins of right lower extremities with pain: Secondary | ICD-10-CM

## 2024-07-28 ENCOUNTER — Ambulatory Visit

## 2024-07-29 ENCOUNTER — Telehealth: Payer: Self-pay

## 2024-07-29 NOTE — Telephone Encounter (Signed)
 Copied from CRM #[id number]. Topic: Clinical - Medication Question >> Jul 29, 2024  2:14 PM Tinnie BROCKS wrote: Reason for CRM: Pt requesting covid vaccine prescription to [id number] [address], [place] - [address] AT [facility] OF [facility] & [address] [place] [address] Phone: [phone] Fax: [phone] Hours: Not open 24 hours Pt would like a mychart message once complete.

## 2024-07-30 ENCOUNTER — Other Ambulatory Visit: Payer: Self-pay | Admitting: Internal Medicine

## 2024-07-30 ENCOUNTER — Other Ambulatory Visit: Payer: Self-pay

## 2024-07-30 DIAGNOSIS — Z23 Encounter for immunization: Secondary | ICD-10-CM

## 2024-07-30 DIAGNOSIS — D500 Iron deficiency anemia secondary to blood loss (chronic): Secondary | ICD-10-CM

## 2024-07-30 MED ORDER — COVID-19 MRNA VAC-TRIS(PFIZER) 30 MCG/0.3ML IM SUSY: 0.3000 mL | PREFILLED_SYRINGE | Freq: Once | INTRAMUSCULAR | 0 refills | Status: AC

## 2024-07-30 NOTE — Telephone Encounter (Signed)
 RX and My chart message has been sent

## 2024-08-03 ENCOUNTER — Ambulatory Visit: Payer: Self-pay | Admitting: Internal Medicine

## 2024-08-03 ENCOUNTER — Encounter: Payer: Self-pay | Admitting: Internal Medicine

## 2024-08-03 ENCOUNTER — Other Ambulatory Visit (INDEPENDENT_AMBULATORY_CARE_PROVIDER_SITE_OTHER)

## 2024-08-03 DIAGNOSIS — D500 Iron deficiency anemia secondary to blood loss (chronic): Secondary | ICD-10-CM

## 2024-08-03 DIAGNOSIS — Z23 Encounter for immunization: Secondary | ICD-10-CM | POA: Diagnosis not present

## 2024-08-03 LAB — CBC WITH DIFFERENTIAL/PLATELET
Basophils Absolute: 0 K/uL (ref 0.0–0.1)
Basophils Relative: 0.3 % (ref 0.0–3.0)
Eosinophils Absolute: 0.1 K/uL (ref 0.0–0.7)
Eosinophils Relative: 1.1 % (ref 0.0–5.0)
HCT: 38.2 % — ABNORMAL LOW (ref 39.0–52.0)
Hemoglobin: 12.4 g/dL — ABNORMAL LOW (ref 13.0–17.0)
Lymphocytes Relative: 20 % (ref 12.0–46.0)
Lymphs Abs: 1.2 K/uL (ref 0.7–4.0)
MCHC: 32.4 g/dL (ref 30.0–36.0)
MCV: 83.3 fl (ref 78.0–100.0)
Monocytes Absolute: 0.5 K/uL (ref 0.1–1.0)
Monocytes Relative: 8.2 % (ref 3.0–12.0)
Neutro Abs: 4.3 K/uL (ref 1.4–7.7)
Neutrophils Relative %: 70.4 % (ref 43.0–77.0)
Platelets: 213 K/uL (ref 150.0–400.0)
RBC: 4.58 Mil/uL (ref 4.22–5.81)
RDW: 15.2 % (ref 11.5–15.5)
WBC: 6.1 K/uL (ref 4.0–10.5)

## 2024-08-03 LAB — IBC + FERRITIN
Ferritin: 31.2 ng/mL (ref 22.0–322.0)
Iron: 53 ug/dL (ref 42–165)
Saturation Ratios: 15.3 % — ABNORMAL LOW (ref 20.0–50.0)
TIBC: 347.2 ug/dL (ref 250.0–450.0)
Transferrin: 248 mg/dL (ref 212.0–360.0)

## 2024-08-12 ENCOUNTER — Ambulatory Visit

## 2024-08-18 ENCOUNTER — Ambulatory Visit

## 2024-08-23 ENCOUNTER — Ambulatory Visit (HOSPITAL_COMMUNITY)
Admission: RE | Admit: 2024-08-23 | Discharge: 2024-08-23 | Disposition: A | Discharge status: Home / Self Care | Source: Ambulatory Visit | Attending: Surgery | Admitting: Surgery

## 2024-08-23 ENCOUNTER — Ambulatory Visit: Admitting: Surgery

## 2024-08-23 ENCOUNTER — Encounter: Payer: Self-pay | Admitting: Surgery

## 2024-08-23 VITALS — BP 128/72 | HR 63 | Temp 98.1°F | Ht 68.0 in | Wt 153.0 lb

## 2024-08-23 DIAGNOSIS — I83811 Varicose veins of right lower extremities with pain: Secondary | ICD-10-CM | POA: Diagnosis not present

## 2024-08-23 DIAGNOSIS — I83891 Varicose veins of right lower extremities with other complications: Secondary | ICD-10-CM | POA: Insufficient documentation

## 2024-08-23 NOTE — Progress Notes (Signed)
 [name]  [id number] Jul 09, 1951 (age: 73 y.o.)  Return Visit Referred by:  No care team member to display  Chief Complaint: Hospital Follow-Up    History of Present Illness: William Luna is a 73yo M w PMHx ACDF C5-7, HLD, GERD, BPH, Hypothyroidism, Primary Biliary Cholangitis, CKD who presents for hospital follow-up. Patient was cutting tree limbs and a large limb landed directly on his head. He presented to the ED on 05/30/2024 as a Level 2 Trauma. Injuries included Focal R Anterior Temporal Venous EDH, Small R SDH, scattered tSAH, R Parietal Bone Fx w Extension through Temporal Bone, Sphenoid Wing, Optic Canal, and Sphenoid Sinus, and a C7 vertebral body fracture. Patient was neurologically intact, but complained of headache and neck pain.    Repeat imaging showed stable appearance of intracranial pathology. MRI and XR C Spine did not demonstrate any discoligamentous / facet injury or instability. Plan for HCAT x 12 Weeks.   At his last appt, [name] revealed resolution of prior multi compartmental ICH (EDH, SDH, tSAH). XR did not show any change in alignment. He was encouraged to continue in HCAT for full 12 week course.  He returns today for repeat XR.  Today, he denies pain. He has neck stiffness on ROM of the cervical spine without the collar in place. Denies upper extremity symptoms.    Medical History[1]   Surgical History[2]      Allergies[3]  Review of Systems: Review of Systems  Home Medications           * atorvastatin  (LIPITOR) 20 mg tablet   * cetirizine (ZyrTEC) 10 mg tablet   * fluticasone propionate (FLONASE) 50 mcg/spray nasal spray   * levothyroxine  (SYNTHROID ) 112 mcg tablet   * sertraline  (ZOLOFT ) 50 mg tablet   * ursodioL  (URSO ) 250 mg tablet       Physical Exam: Physical Exam Neurological Exam Mental Status Awake, alert and oriented to person, place and time.  Motor Normal muscle bulk throughout. Normal muscle tone.                                              Right                     Left Deltoid                                   5                          5  Biceps                                   5                          5  Triceps                                  5                          5  Wrist  flexor                            5                          5  Wrist extensor                       5                          5  Interossei                              5                          5  Sensory Light touch is normal in upper and lower extremities.   Gait Casual gait is normal including stance, stride, and arm swing.   Vitals:  Vitals:   08/24/24 1110  BP: 113/69  Pulse: 70  Resp: 16  SpO2: 100%    Radiographic Studies: I independently reviewed the available imaging including [name] x 2 from 05/2024, MRI C Spine 05/2024, XR C spine 05/2024, [name] from today, and XR C Spine AP Lat Flex Ex from today - Previously noted EDH, tSAH, and SDH have resolved; Nondisplaced fracture of C7 vertebral body without evidence of change in alignment on XR today when compared to prior imaging. No change in alignment on dynamic imaging. Evidence of prior fusion C5-7.     Assessment:  1. Closed C7 fracture without spinal cord injury, with routine healing, subsequent encounter  XR Spine Cervical 2-3 Views   CANCELED: XR Spine Cervical Complete 4-5 Views        Plan: William Luna is a 73yo M who presents about three months s/p accident resulting in acute C7 vertebral body fracture. He has been maintained in C Collar x 12 weeks. He denies neck pain today and denies new / worsening pain on ROM without the collar in place. Dynamic XR today shows stable alignment without pathologic motion. OK to wear collar for comfort only at this time. Encouraged use of heat, stretching, focal massage as needed for myofascial neck pain. He may follow-up as needed at this time. All questions answered.    Follow up: Return if symptoms worsen or fail to  improve.  Orders Placed This Encounter  Procedures  . XR Spine Cervical 2-3 Views    Electronically signed by: William Macario Kelly, PA-C 08/25/2024 9:03 AM. I have personally spent 32 minutes involved in face-to-face and non-face-to-face activities for this patient on the day of the visit. Professional time spent includes the following activities, in addition to those noted in the documentation: Reviewing outside records, reviewing prior office visit notes, reviewing vitals, reviewing prior imaging, physical examination, arranging treatment, counseling patient.        [1] Past Medical History: Diagnosis Date  . 'Light-for-dates' infant with signs of fetal malnutrition (CMD)   . Decreased hearing of left ear   . Eye trauma 05/30/2024   hit w/ tree limb w/ lateral wall fx right eye  . GERD (gastroesophageal reflux disease)    asymptomatic, EGD normal in the past  . Hyperlipidemia   . Hypothyroidism   . LFT elevation 2016   Chronic slight elevation,  normal liver US   . Renal cyst 2016   incidental finding  . Tongue cancer   [2] Past Surgical History: Procedure Laterality Date  . CERVICAL FUSION     Procedure: CERVICAL FUSION  . COLONOSCOPY W/ POLYPECTOMY     Procedure: COLONOSCOPY W/ POLYPECTOMY  . HEMIGLOSSECTOMY  2012   Procedure: HEMIGLOSSECTOMY  . LUMBAR DISCECTOMY  2013   Procedure: LUMBAR DISCECTOMY; ? L4-5  . OTHER SURGICAL HISTORY     Procedure: OTHER SURGICAL HISTORY (squamous cell of the tongue)  . RADICAL NECK DISSECTION Right 2012   Procedure: RADICAL NECK DISSECTION  [3] No Known Allergies *Some images could not be shown.

## 2024-08-23 NOTE — Progress Notes (Signed)
 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  These have been present for a prolonged period of time.  They are essentially asymptomatic.  They do not cause any significant mount of pain.  He does not have any significant leg swelling.  He is not having issues with bleeding.  He does feel that they may be getting a little bit bigger.  Occasionally he will wear compression stockings especially when he is going to be on his feet for long period of time.  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   Renal cyst    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[facility])   mid-tongue squamous cell - excised, clean margins, 11 nodes negative, no adjuvant therapy   TRANSAMINASES, SERUM, ELEVATED      FAMILY HISTORY   Family History  Problem Relation Age of Onset   Diabetes Mother    Anemia Mother    Cirrhosis Mother 76       primary biliary cirrhosis   Hyperlipidemia Father    Hypertension Father    Heart disease Father    Colon cancer Maternal Grandfather    COPD Neg Hx    Colon polyps Neg Hx    Esophageal cancer Neg Hx    Stomach cancer Neg Hx     Rectal cancer Neg Hx     SOCIAL HISTORY:   Social History   Socioeconomic History   Marital status: Married    Spouse name: William Luna   Number of children: 2   Years of education: 27   Highest education level: Professional school degree (e.g., MD, DDS, DVM, JD)  Occupational History   Occupation: RETIRED/Cardiologist    Employer:   Tobacco Use   Smoking status: Never   Smokeless tobacco: Never  Vaping Use   Vaping status: Never Used  Substance and Sexual Activity   Alcohol use: Yes    Alcohol/week: 4.0 standard drinks of alcohol    Types: 4 Cans of beer per week    Comment: OCC.   Drug use: No   Sexual activity: Yes    Partners: Female    Birth control/protection: None  Other Topics Concern   Not on file  Social History Narrative   [employer] [ [place];; scholar}; Hollie Come -MD,   [facility] residency/fellowship.    Married- 1977 - 25 years/divorced;  Married 2006.    1 son  b1980, 1 daughter - [phone]; [place] [phone],  Step-daughter [phone].    Grandchildren - 2 (  b'2010, 2013).    Work - Building surveyor. Retired 2016   ACP/Living will:  Wife  with healthcare POA; Yes - CPR, Yes - short-term mechanical ventilation,  no futile care.   Patient no alcohol no tobacco or drug use      Lives with wife/2025   Social Drivers of Health   Financial Resource Strain: Low Risk  (05/01/2024)   Overall Financial Resource Strain (CARDIA)    Difficulty of Paying Living Expenses: Not hard at all  Food Insecurity: Low Risk  (06/16/2024)   Received from [facility]   Hunger Vital Sign    Within the past 12 months, you worried that your food would run out before you got money to buy more: Never true    Within the past 12 months, the food you bought just didn't last and you didn't have money to get more. : Never true  Transportation Needs: No Transportation Needs (06/16/2024)   Received from [facility]    In the past 12 months, has lack of reliable  transportation kept you from medical appointments, meetings, work or from getting things needed for daily living? : No  Physical Activity: Sufficiently Active (05/01/2024)   Exercise Vital Sign    Days of Exercise per Week: 5 days    Minutes of Exercise per Session: 30 min  Stress: No Stress Concern Present (05/01/2024)   [employer] of Occupational Health - Occupational Stress Questionnaire    Feeling of Stress: Not at all  Social Connections: Socially Integrated (05/01/2024)   Social Connection and Isolation Panel    Frequency of Communication with Friends and Family: More than three times a week    Frequency of Social Gatherings with Friends and Family: Twice a week    Attends Religious Services: 1 to 4 times per year    Active Member of [employer] or Organizations: Yes    Attends Banker Meetings: More than 4 times per year    Marital Status: Married  Catering manager Violence: Not At Risk (05/04/2024)   Humiliation, Afraid, Rape, and Kick questionnaire    Fear of Current or Ex-Partner: No    Emotionally Abused: No    Physically Abused: No    Sexually Abused: No    ALLERGIES:    No Known Allergies  CURRENT MEDICATIONS:    Current Outpatient Medications  Medication Sig Dispense Refill   atorvastatin  (LIPITOR) 20 MG tablet TAKE 1 TABLET DAILY 90 tablet 3   cetirizine (ZYRTEC) 10 MG tablet Take 10 mg by mouth daily.     Ferric Maltol  (ACCRUFER ) 30 MG CAPS Take 1 capsule (30 mg total) by mouth in the morning and at bedtime. 180 capsule 0   levothyroxine  (SYNTHROID ) 112 MCG tablet TAKE 1 TABLET DAILY 90 tablet 3   polyethylene glycol powder (GLYCOLAX/MIRALAX) 17 GM/SCOOP powder Take 1 Container by mouth once. (Patient taking differently: Take 1 Container by mouth once.)     sertraline  (ZOLOFT ) 50 MG tablet TAKE 1 TABLET TWICE A DAY 180 tablet 3   ursodiol  (ACTIGALL ) 250 MG tablet TAKE 4 TABLETS BY MOUTH DAILY 360 tablet 1   zaleplon  (SONATA ) 10 MG capsule Take 1  capsule (10 mg total) by mouth at bedtime as needed for sleep. 50 capsule 0   acetaminophen  (TYLENOL ) 500 MG tablet Take 1,000 mg by mouth as needed for headache.     No current facility-administered medications for this visit.    REVIEW OF SYSTEMS:   [X]   denotes positive finding, [ ]  denotes negative finding Cardiac  Comments:  Chest pain or chest pressure:    Shortness of breath upon exertion:    Short of breath when lying flat:    Irregular heart rhythm:        Vascular    Pain in calf, thigh, or hip brought on by ambulation:    Pain in feet at night that wakes you up from your sleep:     Blood clot in your veins:    Leg swelling:         Pulmonary    Oxygen at home:    Productive cough:     Wheezing:         Neurologic    Sudden weakness in arms or legs:     Sudden numbness in arms or legs:     Sudden onset of difficulty speaking or slurred speech:    Temporary loss of vision in one eye:     Problems with dizziness:         Gastrointestinal    Blood in stool:      Vomited blood:         Genitourinary    Burning when urinating:     Blood in urine:        Psychiatric    Major depression:         Hematologic    Bleeding problems:    Problems with blood clotting too easily:        Skin    Rashes or ulcers:        Constitutional    Fever or chills:     PHYSICAL EXAM:   Vitals:   08/23/24 1337  BP: 128/72  Pulse: 63  Temp: 98.1 F (36.7 C)  SpO2: 98%  Weight: 153 lb (69.4 kg)  Height: 5' 8 (1.727 m)    GENERAL: The patient is a well-nourished male, in no acute distress. The vital signs are documented above. CARDIAC: There is a regular rate and rhythm.  VASCULAR: SonoSite was used to evaluate the small saphenous vein.  I do see what appears to be a dilated small saphenous vein but is difficult to determine if this is a varicosity or not.  He does have a large cluster of varicosities behind the knee and in the right calf PULMONARY: Nonlabored  respirations MUSCULOSKELETAL: There are no major deformities or cyanosis. NEUROLOGIC: No focal weakness or paresthesias are detected. SKIN: There are no ulcers or rashes noted. PSYCHIATRIC: The patient has a normal affect.  STUDIES:   I have reviewed the following:   Venous Reflux Times  +--------------+---------+------+-----------+------------+---------+  RIGHT        Reflux NoRefluxReflux TimeDiameter cmsComments                           Yes                                    +--------------+---------+------+-----------+------------+---------+  CFV          no                                               +--------------+---------+------+-----------+------------+---------+  FV prox       no                                               +--------------+---------+------+-----------+------------+---------+  FV mid        no                                               +--------------+---------+------+-----------+------------+---------+  FV dist                 yes   >1 second                        +--------------+---------+------+-----------+------------+---------+  Popliteal    no                                               +--------------+---------+------+-----------+------------+---------+  GSV at [facility]    no                            0.66               +--------------+---------+------+-----------+------------+---------+  GSV prox thigh          yes    >500 ms      0.26               +--------------+---------+------+-----------+------------+---------+  GSV mid thigh           yes    >500 ms      0.26               +--------------+---------+------+-----------+------------+---------+  GSV dist thighno                            0.25               +--------------+---------+------+-----------+------------+---------+  GSV at knee             yes    >500 ms      0.13                +--------------+---------+------+-----------+------------+---------+  GSV prox calf no                            0.19               +--------------+---------+------+-----------+------------+---------+  GSV mid calf            yes    >500 ms      0.17    tributary  +--------------+---------+------+-----------+------------+---------+  GSV dist calf no                            0.16    tributary  +--------------+---------+------+-----------+------------+---------+  SSV at [facility]    no                            0.54               +--------------+---------+------+-----------+------------+---------+  SSV prox calf           yes    >500 ms      0.27               +--------------+---------+------+-----------+------------+---------+  SSV mid calf  no  0.12               +--------------+---------+------+-----------+------------+---------+       ASSESSMENT and PLAN   CEAP class II, right leg: No significant swelling or pain are noted in the right leg he does have clusters of varicosities behind the leg that are not painful.  Formal reflux evaluation showed superficial veins that were too small for ablation however on my evaluation I felt that there was a small saphenous that was dilated, although was difficult to determine if this was a varicosity or the small saphenous.  Regardless after understanding that there is no pressing need to treat these, Dr. Micky would prefer to just monitor these.  I think he is at low risk for bleeding because of the amount of skin over top of the varicosities.  Since the varicosities are asymptomatic without causing him pain or swelling, I think this is reasonable.  He will contact me should he have any change.   Malvina Serene CLORE, MD, [name] [facility] [phone] Pager [phone]

## 2024-08-24 DIAGNOSIS — S12290D Other displaced fracture of third cervical vertebra, subsequent encounter for fracture with routine healing: Secondary | ICD-10-CM | POA: Diagnosis not present

## 2024-08-24 DIAGNOSIS — S12600D Unspecified displaced fracture of seventh cervical vertebra, subsequent encounter for fracture with routine healing: Secondary | ICD-10-CM | POA: Diagnosis not present

## 2024-08-24 DIAGNOSIS — T1490XA Injury, unspecified, initial encounter: Secondary | ICD-10-CM | POA: Diagnosis not present

## 2024-08-24 DIAGNOSIS — S12690D Other displaced fracture of seventh cervical vertebra, subsequent encounter for fracture with routine healing: Secondary | ICD-10-CM | POA: Diagnosis not present

## 2024-08-24 NOTE — Progress Notes (Signed)
 In general, would you say your health is:  very good  In general, would you say your quality of life is: very good  In general, how would you rate your physical health?  very good  In general, how would you rate your mental health, including your mood and your ability to think?  very good  In general, how would you rate your satisfaction with your social activities and relationships? very good  In general, please rate how well you carry out your usual social activities and roles. 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 Completely  In the past 7 days. . .  How often have you been bothered by emotional problems such as feeling anxious, depressed or irritable?  Never  How would you rate your fatigue on average?  None  How would you rate your pain on average?  0

## 2024-08-25 ENCOUNTER — Encounter

## 2024-08-26 ENCOUNTER — Ambulatory Visit: Attending: Physician Assistant

## 2024-08-26 DIAGNOSIS — R41841 Cognitive communication deficit: Secondary | ICD-10-CM | POA: Insufficient documentation

## 2024-08-26 NOTE — Therapy (Addendum)
 OUTPATIENT SPEECH LANGUAGE PATHOLOGY TREATMENT   Patient Name: William RADEMAKER, MD MRN: [id number] DOB:1951-01-03, 73 y.o., male Today's Date: 08/26/2024  PCP: Charlena Molt, MD REFERRING PROVIDER: Leverette Jerrell PARAS., PA-C  END OF SESSION:  End of Session - 08/26/24 1349     Visit Number 5    Number of Visits 9    Date for Recertification  08/26/24   first ST 06-22-24   SLP Start Time 1355    SLP Stop Time  1429    SLP Time Calculation (min) 34 min    Activity Tolerance Patient tolerated treatment well          Past Medical History:  Diagnosis Date   Adenomatous colon polyp    2003   ANEMIA, IRON DEFICIENCY    mild in the past   BPH (benign prostatic hyperplasia)    Cataract    CERVICAL RADICULOPATHY    disectomy in 1990s   DDD (degenerative disc disease), lumbar oct '12   L4-5, ESI therapy successful 10/12; L5-S1 radiculopathy 3/13   Fundic gland polyps of stomach, benign    GERD    EGD negative x 3, off medication (July '13)   Hearing loss in left ear    HYPERLIPIDEMIA, MILD    lipitor 10mg     Hypothyroidism    Liver cyst    Primary biliary cholangitis (HCC) 09/18/2018   Renal cyst    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[place])   mid-tongue squamous cell - excised, clean margins, 11 nodes negative, no adjuvant therapy   TRANSAMINASES, SERUM, ELEVATED    Past Surgical History:  Procedure Laterality Date   BACK SURGERY  '90s ([place])   CERVICAL DISC - diskectomy w/ fusion, anterior   CHOLECYSTECTOMY N/A 01/05/2021   Procedure: LAPAROSCOPIC CHOLECYSTECTOMY WITH INTRAOPERATIVE CHOLANGIOGRAM;  Surgeon: Gladis Cough, MD;  Location: [facility];  Service: General;  Laterality: N/A;   COLONOSCOPY     Multiple   ESOPHAGOGASTRODUODENOSCOPY     Multiple   HEMIGLOSSECTOMY  10/30/2011   Procedure: HEMIGLOSSECTOMY;  Surgeon: Norleen CHRISTELLA Notice;  Location: [facility];  Service: ENT;  Laterality: Right;  Right tongue resection   LUMBAR LAMINECTOMY/DECOMPRESSION MICRODISCECTOMY   01/29/2012   Procedure: LUMBAR LAMINECTOMY/DECOMPRESSION MICRODISCECTOMY;  Surgeon: Victory Gens, MD;  Location: [facility];  Service: Neurosurgery;  Laterality: Left;  Left L5-S1 Microdiskectomy   LUMBAR LAMINECTOMY/DECOMPRESSION MICRODISCECTOMY  07/13/2012   Procedure: LUMBAR LAMINECTOMY/DECOMPRESSION MICRODISCECTOMY 1 LEVEL;  Surgeon: Victory Gens, MD;  Location: [facility];  Service: Neurosurgery;  Laterality: Left;  Redo Left Lumbar five-sacral one Microdiskectomy   nail avulsion     right first finger due to a wart   RADICAL NECK DISSECTION  10/30/2011   Procedure: RADICAL NECK DISSECTION;  Surgeon: Norleen CHRISTELLA Notice;  Location: [facility];  Service: ENT;  Laterality: Right;   Patient Active Problem List   Diagnosis Date Noted   Iron deficiency anemia due to chronic blood loss 07/06/2024   Essential thrombocytosis (HCC) 07/01/2024   Screening for ischemic heart disease 05/17/2024   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, right 05/17/2024   Rotator cuff tendinitis, right 07/16/2023   Chronic renal disease, stage 2, mildly decreased glomerular filtration rate (GFR) between 60-89 mL/min/1.73 square meter 12/12/2022   Insomnia due to anxiety and fear 09/05/2022   Primary biliary cholangitis (HCC) 09/18/2018   Deficiency anemia 05/27/2018   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 atrophy of thyroid  10/01/2016   Benign prostatic hyperplasia without lower urinary tract symptoms 10/01/2016  Prediabetes 10/09/2015   Squamous cell cancer of tongue (HCC)    Hyperlipidemia LDL goal <130 11/17/2008   Obsessive behaviors 11/17/2008   GERD 11/17/2008   Degenerative joint disease (DJD) of lumbar spine 11/17/2008   CERVICAL RADICULOPATHY 11/17/2008   SPEECH THERAPY RENEWAL-DISCHARGE SUMMARY  Visits from Start of Care: 5  Current functional level related to goals / functional outcomes: Pt will be seen today with LTGs enforced and then will be d/c'd. See below.   Remaining deficits: Wife thinks pt is  at baseline.   Education / Equipment: See individual therapy session notes.   Patient agrees to discharge. Patient goals were partially met. Patient is being discharged due to pt appears to be at baseline.     ONSET DATE: 05/30/24   REFERRING DIAG: U92.XXXA (ICD-10-CM) - Unspecified multiple injuries, initial encounter T14.90XA (ICD-10-CM) - Injury, unspecified, initial encounter  THERAPY DIAG:  Cognitive communication deficit  Rationale for Evaluation and Treatment: Rehabilitation  SUBJECTIVE:   SUBJECTIVE STATEMENT: [name] reported [name] stated she didn't need to sit with pt and complete TED Talk/ YouTube auditory selection as, to her, his memory appears to be back to baseline.   Pt accompanied by: self   PERTINENT HISTORY: HLD, GERD, BPH, Hypothyrodism, Primary biliary cholangitis, CKD, prediabetes and PSH: ACDF C4-5   PAIN:  Are you having pain? No  FALLS: Has patient fallen in last 6 months?  Yes, Number of falls: one, at time of event   PATIENT GOALS: Inquire whether skilled ST would benefit him at this time  OBJECTIVE:  Note: Objective measures were completed at Evaluation unless otherwise noted.   PATIENT REPORTED OUTCOME MEASURES (PROM): Cognitive Function: PROMs were returned on 07/05/24.[name] could not answer some questions due to unsure of how [name] is responding in those situations on the questionnaire. For this reason SLP will not take [name]'s score into consideration. [name] scored himself 134/140 with an amended score. Initially, pt scored himself 129/140. (Most likely, a difference of 5 is not statistically significant) Higher scores indicate less impact on pt's QoL and/or ADLs.Jeff's amended scores indicate a score of 4 with remembering where things were placed, remembering a list of 4-5 errands, trouble remembering whether he did things he was supposed to do (e.g., medication, etc), remembering new information like phone numbers or simple instructions, walking into a  room and forgetting what I meant to do there, and trouble remembering the name of a familiar person.                                                                                                                            TREATMENT DATE:   08/26/24: Pt informed SLP of three instances since the last session which he thought SLP should know about involving anomia with cardinal, walking into a room and not recalling the item he needed in the room, and recalling what he did the previous day. SLP suggested some things based upon these three items, and pt  stated he may incorporate SLP suggestions. SLUMS was completed today with pt 29/30 = WNL.  PROM was scored today at 136/140 - increased over initial administration.  07/13/24: SLP TO ASK ABOUT MED TIMING NEXT SESSION. Pt schedule last week did not lend itself to complete homework wth wife. He described a task he did since last session. 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 Today SLP read word problems to pt and he wrote notes and answered questions using a calculator with  100% success. He double checked answers. SLP conferred with pt and they agreed once every two weeks was sufficient for remaining ST.   07/05/24: SLP to ask about timing of med administration next session. Pt reviewed medical records back to 2003 regarding hemoglobin anomaly. Discussed this with a MD friend. Jeff/[name] did a 11 minute Ted Talk and with [name] writing down details, recalled details more accurately than [name]. Today SLP worked with pt's attention in detailed auditory instructions. Pt confirmed instructions 2/6, SLP affirmed this was a good strategy if pt questions his accuracy in recall. [name] req'd min A from SLP for one error. Discussion with [name] and [name] to be as accurate and truthful as possible with pt behavior considering personality vs true  deficits. SLP suggested pt and wife keep open and understanding communication around this topic for pt's benefit. [name] confer SLP for assistance today. He and [name] will listen to two 20-minute (>18 minutes) Ted Talks before next visit.  06/28/24: SLP used some subtests from Cognitive Linguistic Quick Test (CLQT) to gather more information about areas where pt might show deficits, in order to hone ST treatment plan. In some subtests targeting attention and attention to detail (symbol trails, generative naming, mazes, story retelling), pt demonstrated difficulty with following and recalling auditory details. Practically at home, [name] is seeing some evidence of decr'd auditory memory and/or attention. 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 The extent to which this condition affected pt's performance today is unknown, however it should be considered when presenting pt visual information during evaluation and therapy tasks. SLP will complete CLQT next session and will report his scores at that time. If pt desires more complete and precise information regarding cognition a neuropsychological evaluation should be considered which uses a greater number of assessments with narrowed diagnostic foci. Pt also wears hearing aids and these were placed for therapy today. SLP suggested pt should complete practical auditory attention tasks such as listen to/watch 10-20 minutes auditory information (YouTube, [employer], podcasts) and take notes to provide synopsis with [name]. Pt should pause video/audio and take breaks if/when he feels/thinks his brain feels taxed.  06/17/24: n/a  PATIENT EDUCATION: Education details: see treatment date for today's date Person educated: Patient Education method: Explanation Education comprehension: verbalized understanding   GOALS: Goals reviewed with patient? No  SHORT TERM GOALS:  Target date: , 07/31/24 (due to visit count)  Pt will demo ability to attend to detail in functional novel mod complex cognitive linguistic tasks in 2 sessions Baseline: 07/13/24 Goal status: partially met  2.  Pt will complete novel homework tasks with 100% success given mod I (compensations) in 2 sessions  Baseline:  07/05/24 Goal status: partially met  3.  Pt will use trained memory strategies successfully with medication  administration for 2 weeks with accuracy at least 95% re: timing Baseline:  Goal status: deferred   LONG TERM GOALS: Target date: 08/13/24  Pt will improve PROM from initial administration Baseline:  Goal status: met  2.  Pt will demo success with functional auditory mod complex cognitive linguistic tasks compensations (e.g. notes) allowed, in 2 sessions Baseline: 08/26/24 Goal status: partially met  3.  Pt will successfully and independently complete daily tasks using trained memory strategies PRN  Baseline:  Goal status: met   ASSESSMENT:  CLINICAL IMPRESSION: RECERT-D/C today. Patient is a 73 y.o. M who was seen today for ongoing treatment of cognitive linguistics. Pt continues to participate with detailed daily tasks at home with success. See treatment date above for today's date for further details on today's session. Pt and SLP agree on d/c today.  OBJECTIVE IMPAIRMENTS: Possibly include attention, memory, awareness, executive functioning, and /or auditory processing and comprehension. These impairments are limiting patient from managing medications, household responsibilities, ADLs/IADLs, and effectively communicating at home and in community. Factors affecting potential to achieve goals and functional outcome are cooperation/participation level due to initial response of pt to explain away deficits. Patient will benefit from skilled SLP services to address above impairments and improve overall function.  REHAB POTENTIAL: Excellent  PLAN:  d/c  today  PLANNED INTERVENTIONS: Environmental controls, Cueing hierachy, Cognitive reorganization, Internal/external aids, Functional tasks, SLP instruction and feedback, Compensatory strategies, Patient/family education, and [id number] Treatment of speech (30 or 45 min)     Detrell Umscheid, CCC-SLP 08/26/2024, 2:51 PM

## 2024-08-26 NOTE — Addendum Note (Signed)
 Addended by: JACELYN PITTS B on: 08/26/2024 02:53 PM   Modules accepted: Orders

## 2024-09-15 ENCOUNTER — Encounter

## 2024-09-15 ENCOUNTER — Encounter: Payer: Self-pay | Admitting: Internal Medicine

## 2024-09-17 ENCOUNTER — Other Ambulatory Visit: Payer: Self-pay | Admitting: Medical Genetics

## 2024-09-17 DIAGNOSIS — Z006 Encounter for examination for normal comparison and control in clinical research program: Secondary | ICD-10-CM

## 2024-09-24 ENCOUNTER — Encounter: Payer: Self-pay | Admitting: Internal Medicine

## 2024-09-24 NOTE — Telephone Encounter (Signed)
Ask him to come in

## 2024-09-28 ENCOUNTER — Encounter: Payer: Self-pay | Admitting: Internal Medicine

## 2024-09-28 ENCOUNTER — Ambulatory Visit: Admitting: Internal Medicine

## 2024-09-28 ENCOUNTER — Ambulatory Visit: Payer: Self-pay | Admitting: Internal Medicine

## 2024-09-28 VITALS — BP 128/70 | HR 73 | Temp 97.8°F | Resp 16 | Ht 68.0 in | Wt 157.0 lb

## 2024-09-28 DIAGNOSIS — E034 Atrophy of thyroid (acquired): Secondary | ICD-10-CM | POA: Diagnosis not present

## 2024-09-28 DIAGNOSIS — K743 Primary biliary cirrhosis: Secondary | ICD-10-CM

## 2024-09-28 DIAGNOSIS — D500 Iron deficiency anemia secondary to blood loss (chronic): Secondary | ICD-10-CM | POA: Diagnosis not present

## 2024-09-28 LAB — CBC WITH DIFFERENTIAL/PLATELET
Basophils Absolute: 0 K/uL (ref 0.0–0.1)
Basophils Relative: 0.3 % (ref 0.0–3.0)
Eosinophils Absolute: 0 K/uL (ref 0.0–0.7)
Eosinophils Relative: 0.7 % (ref 0.0–5.0)
HCT: 41 % (ref 39.0–52.0)
Hemoglobin: 13.4 g/dL (ref 13.0–17.0)
Lymphocytes Relative: 19.9 % (ref 12.0–46.0)
Lymphs Abs: 1.4 K/uL (ref 0.7–4.0)
MCHC: 32.6 g/dL (ref 30.0–36.0)
MCV: 80.3 fl (ref 78.0–100.0)
Monocytes Absolute: 0.6 K/uL (ref 0.1–1.0)
Monocytes Relative: 8.5 % (ref 3.0–12.0)
Neutro Abs: 5.1 K/uL (ref 1.4–7.7)
Neutrophils Relative %: 70.6 % (ref 43.0–77.0)
Platelets: 202 K/uL (ref 150.0–400.0)
RBC: 5.11 Mil/uL (ref 4.22–5.81)
RDW: 15.9 % — ABNORMAL HIGH (ref 11.5–15.5)
WBC: 7.2 K/uL (ref 4.0–10.5)

## 2024-09-28 LAB — HEPATIC FUNCTION PANEL
ALT: 17 U/L (ref 0–53)
AST: 25 U/L (ref 0–37)
Albumin: 4.5 g/dL (ref 3.5–5.2)
Alkaline Phosphatase: 100 U/L (ref 39–117)
Bilirubin, Direct: 0.1 mg/dL (ref 0.0–0.3)
Total Bilirubin: 0.4 mg/dL (ref 0.2–1.2)
Total Protein: 7.9 g/dL (ref 6.0–8.3)

## 2024-09-28 LAB — IBC + FERRITIN
Ferritin: 30.9 ng/mL (ref 22.0–322.0)
Iron: 53 ug/dL (ref 42–165)
Saturation Ratios: 14.4 % — ABNORMAL LOW (ref 20.0–50.0)
TIBC: 366.8 ug/dL (ref 250.0–450.0)
Transferrin: 262 mg/dL (ref 212.0–360.0)

## 2024-09-28 LAB — GAMMA GT: GGT: 44 U/L (ref 7–51)

## 2024-09-28 LAB — TSH: TSH: 5.8 u[IU]/mL — ABNORMAL HIGH (ref 0.35–5.50)

## 2024-09-28 NOTE — Progress Notes (Signed)
 "  Subjective:  Patient ID: William JONETTA Forget, William Luna, male    DOB: [birth year]  Age: 73 y.o. MRN: [id number]  CC: Anemia and Hypothyroidism   HPI William JONETTA Forget, William Luna presents for f/up ----  Discussed the use of AI scribe software for clinical note transcription with the patient, who gave verbal consent to proceed.  History of Present Illness William JONETTA Forget, William Luna William Luna is a 73 year old male who presents for follow-up on his medical conditions.  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 He is now able to see and function normally without any visual disturbances.  He has been wearing a brace for twelve weeks following a neurosurgical procedure and is now cleared to resume normal activities with caution. He notes some tightness and limited range of motion when moving side to side, likely due to previous spinal fusion at two levels.  He has been managing anemia with iron supplementation as prescribed. He has refilled his iron prescription three times. His energy levels are good, allowing him to engage in activities such as yard work and walking. He is interested in checking his hemoglobin and hematocrit levels to assess improvement.  He has a history of liver function test abnormalities, with previous elevations in alkaline phosphatase and GGT. These levels were improving after he stopped taking high-dose Tylenol  and tramadol . He is curious about the current status of these levels and whether they have normalized.  He recently received vaccinations for COVID, RSV, and flu. He also underwent a vein study due to concerns about superficial veins that might bleed. The vascular specialist advised that no intervention is necessary unless symptoms develop or for cosmetic reasons.  No dizziness, lightheadedness, constipation, diarrhea, or leg swelling. Occasional gait instability is noted, which he feels is not worse than before his injury. He is not currently taking any  pain medication and reports no symptoms of thyroid  dysfunction. He takes Miralax regularly and has no issues with constipation despite iron supplementation. His appetite is good.     Outpatient Medications Prior to Visit  Medication Sig Dispense Refill   atorvastatin  (LIPITOR) 20 MG tablet TAKE 1 TABLET DAILY 90 tablet 3   cetirizine (ZYRTEC) 10 MG tablet Take 10 mg by mouth daily.     Ferric Maltol  (ACCRUFER ) 30 MG CAPS Take 1 capsule (30 mg total) by mouth in the morning and at bedtime. 180 capsule 0   levothyroxine  (SYNTHROID ) 112 MCG tablet TAKE 1 TABLET DAILY 90 tablet 3   polyethylene glycol powder (GLYCOLAX/MIRALAX) 17 GM/SCOOP powder Take 1 Container by mouth once. (Patient taking differently: Take 1 Container by mouth once.)     sertraline  (ZOLOFT ) 50 MG tablet TAKE 1 TABLET TWICE A DAY 180 tablet 3   ursodiol  (ACTIGALL ) 250 MG tablet TAKE 4 TABLETS BY MOUTH DAILY 360 tablet 1   zaleplon  (SONATA ) 10 MG capsule Take 1 capsule (10 mg total) by mouth at bedtime as needed for sleep. 50 capsule 0   acetaminophen  (TYLENOL ) 500 MG tablet Take 1,000 mg by mouth as needed for headache.     No facility-administered medications prior to visit.    ROS Review of Systems  Constitutional:  Negative for chills, diaphoresis, fatigue and fever.  HENT: Negative.    Eyes: Negative.   Respiratory: Negative.  Negative for cough, chest tightness, shortness of breath and wheezing.   Cardiovascular:  Negative for chest pain, palpitations and leg swelling.  Gastrointestinal: Negative.  Negative for abdominal pain, constipation, diarrhea,  nausea and vomiting.  Endocrine: Negative for cold intolerance and heat intolerance.  Genitourinary: Negative.   Musculoskeletal: Negative.  Negative for arthralgias and myalgias.  Skin: Negative.   Neurological: Negative.  Negative for dizziness, weakness, light-headedness and headaches.  Hematological:  Negative for adenopathy. Does not bruise/bleed easily.   Psychiatric/Behavioral: Negative.      Objective:  BP 128/70 (BP Location: Left Arm, Patient Position: Sitting, Cuff Size: Normal)   Pulse 73   Temp 97.8 F (36.6 C) (Oral)   Resp 16   Ht 5' 8 (1.727 m)   Wt 157 lb (71.2 kg)   SpO2 98%   BMI 23.87 kg/m   BP Readings from Last 3 Encounters:  09/28/24 128/70  08/23/24 128/72  07/01/24 128/68    Wt Readings from Last 3 Encounters:  09/28/24 157 lb (71.2 kg)  08/23/24 153 lb (69.4 kg)  07/01/24 147 lb 6.4 oz (66.9 kg)    Physical Exam Vitals reviewed.  Constitutional:      Appearance: Normal appearance.  HENT:     Nose: Nose normal.     Mouth/Throat:     Mouth: Mucous membranes are moist.  Eyes:     General: No scleral icterus.    Conjunctiva/sclera: Conjunctivae normal.  Cardiovascular:     Rate and Rhythm: Normal rate and regular rhythm.     Heart sounds: No murmur heard.    No friction rub. No gallop.  Pulmonary:     Effort: Pulmonary effort is normal.     Breath sounds: No stridor. No wheezing, rhonchi or rales.  Abdominal:     General: Abdomen is flat. Bowel sounds are normal.     Palpations: Abdomen is soft. There is no mass.     Tenderness: There is no abdominal tenderness. There is no guarding.     Hernia: No hernia is present.  Musculoskeletal:        General: Normal range of motion.     Cervical back: Neck supple.     Right lower leg: No edema.     Left lower leg: No edema.  Lymphadenopathy:     Cervical: No cervical adenopathy.  Skin:    General: Skin is warm.  Neurological:     General: No focal deficit present.     Mental Status: He is alert.  Psychiatric:        Mood and Affect: Mood normal.        Behavior: Behavior normal.     Lab Results  Component Value Date   WBC 7.2 09/28/2024   HGB 13.4 09/28/2024   HCT 41.0 09/28/2024   PLT 202.0 09/28/2024   GLUCOSE 87 07/01/2024   CHOL 125 07/01/2024   TRIG 67.0 07/01/2024   HDL 42.10 07/01/2024   LDLDIRECT 134.5 12/15/2006    LDLCALC 70 07/01/2024   ALT 17 09/28/2024   AST 25 09/28/2024   NA 139 07/01/2024   K 4.2 07/01/2024   CL 100 07/01/2024   CREATININE 0.84 07/01/2024   BUN 16 07/01/2024   CO2 31 07/01/2024   TSH 5.80 (H) 09/28/2024   PSA 0.9 02/25/2024   INR 1.3 (H) 07/01/2024   HGBA1C 5.8 07/01/2024    VAS US  LOWER EXTREMITY VENOUS REFLUX Result Date: 08/23/2024  Lower Venous Reflux Study Patient Name:  DIAMONTE STAVELY  Date of Exam:   08/23/2024 Medical Rec #: [id number]       Accession #:    [id number] Date of Birth: 1951-07-17  Patient Gender: M Patient Age:   65 years Exam Location:  [address] Procedure:      VAS US  LOWER EXTREMITY VENOUS REFLUX Referring Phys: GAILE NEW --------------------------------------------------------------------------------  Indications: VV of lower extremity with pain.  Comparison Study: N/A Performing Technologist: Dena Pane  Examination Guidelines: A complete evaluation includes B-mode imaging, spectral Doppler, color Doppler, and power Doppler as needed of all accessible portions of each vessel. Bilateral testing is considered an integral part of a complete examination. Limited examinations for reoccurring indications may be performed as noted. The reflux portion of the exam is performed with the patient in reverse Trendelenburg. Significant venous reflux is defined as >500 ms in the superficial venous system, and >1 second in the deep venous system.  Venous Reflux Times +--------------+---------+------+-----------+------------+---------+ RIGHT         Reflux NoRefluxReflux TimeDiameter cmsComments                          Yes                                   +--------------+---------+------+-----------+------------+---------+ CFV           no                                              +--------------+---------+------+-----------+------------+---------+ FV prox       no                                               +--------------+---------+------+-----------+------------+---------+ FV mid        no                                              +--------------+---------+------+-----------+------------+---------+ FV dist                 yes   >1 second                       +--------------+---------+------+-----------+------------+---------+ Popliteal     no                                              +--------------+---------+------+-----------+------------+---------+ GSV at [facility]    no                            0.66              +--------------+---------+------+-----------+------------+---------+ GSV prox thigh          yes    >500 ms      0.26              +--------------+---------+------+-----------+------------+---------+ GSV mid thigh           yes    >500 ms      0.26              +--------------+---------+------+-----------+------------+---------+  GSV dist thighno                            0.25              +--------------+---------+------+-----------+------------+---------+ GSV at knee             yes    >500 ms      0.13              +--------------+---------+------+-----------+------------+---------+ GSV prox calf no                            0.19              +--------------+---------+------+-----------+------------+---------+ GSV mid calf            yes    >500 ms      0.17    tributary +--------------+---------+------+-----------+------------+---------+ GSV dist calf no                            0.16    tributary +--------------+---------+------+-----------+------------+---------+ SSV at [facility]    no                            0.54              +--------------+---------+------+-----------+------------+---------+ SSV prox calf           yes    >500 ms      0.27              +--------------+---------+------+-----------+------------+---------+ SSV mid calf  no                            0.12               +--------------+---------+------+-----------+------------+---------+  Summary: Right: - No evidence of deep vein thrombosis seen in the right lower extremity, from the common femoral through the popliteal veins. - No evidence of superficial venous thrombosis in the right lower extremity.  -Please see noted reflux above. 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 *See table(s) above for measurements and observations. Electronically signed by Gaile New William Luna on 08/23/2024 at 3:12:20 PM.    Final     Assessment & Plan:   Iron deficiency anemia due to chronic blood loss- H/H and iron are normal. -     CBC with Differential/Platelet; Future -     IBC + Ferritin; Future  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 atrophy of thyroid - He is euthyroid. -     TSH; Future  Primary biliary cholangitis (HCC) -     Gamma GT; Future -     Hepatic function panel; Future     Follow-up: Return in about 4 months (around 01/26/2025).  Debby Molt, William Luna "

## 2024-09-28 NOTE — Telephone Encounter (Signed)
Patient has been scheduled to see Dr Yetta Barre

## 2024-09-28 NOTE — Patient Instructions (Signed)

## 2024-10-28 DIAGNOSIS — N401 Enlarged prostate with lower urinary tract symptoms: Secondary | ICD-10-CM | POA: Diagnosis not present

## 2024-10-28 DIAGNOSIS — N138 Other obstructive and reflux uropathy: Secondary | ICD-10-CM | POA: Diagnosis not present

## 2024-10-28 DIAGNOSIS — N411 Chronic prostatitis: Secondary | ICD-10-CM | POA: Diagnosis not present

## 2024-10-28 DIAGNOSIS — N281 Cyst of kidney, acquired: Secondary | ICD-10-CM | POA: Diagnosis not present

## 2024-11-02 ENCOUNTER — Other Ambulatory Visit: Payer: Self-pay

## 2024-11-02 DIAGNOSIS — D500 Iron deficiency anemia secondary to blood loss (chronic): Secondary | ICD-10-CM

## 2024-11-02 MED ORDER — ACCRUFER 30 MG PO CAPS: 1.0000 | ORAL_CAPSULE | Freq: Two times a day (BID) | ORAL | 0 refills | Status: DC

## 2024-11-03 ENCOUNTER — Telehealth: Payer: Self-pay | Admitting: Medical Genetics

## 2024-11-03 DIAGNOSIS — Z15060 Genetic susceptibility to colorectal cancer: Secondary | ICD-10-CM

## 2024-11-03 LAB — GENECONNECT MOLECULAR SCREEN: Genetic Analysis Overall Interpretation: POSITIVE — AB

## 2024-11-26 ENCOUNTER — Encounter: Payer: Self-pay | Admitting: Genetic Counselor

## 2024-11-26 DIAGNOSIS — Z15060 Genetic susceptibility to colorectal cancer: Secondary | ICD-10-CM | POA: Insufficient documentation

## 2024-11-26 NOTE — Telephone Encounter (Signed)
 [place] [facility] Positive Result Note 11/26/2024 2:20 PM  SECOND ATTEMPT: Confirmed I was speaking with William Luna Forget, MD [id number] by using name and DOB. Informed participant the reason for this call is to provide results for the above study. Results revealed Hereditary Breast and Ovarian Syndrome. Genetic counseling was offered and participant declined. All questions were answered, and participant was thanked for their time and support of the above study. Participant was encouraged to contact [facility] if they have any further questions or concerns.

## 2024-12-02 ENCOUNTER — Encounter: Payer: Self-pay | Admitting: Internal Medicine

## 2024-12-02 ENCOUNTER — Telehealth: Payer: Self-pay

## 2024-12-02 NOTE — Telephone Encounter (Signed)
 Copied from CRM [id number]. Topic: Clinical - Request for Lab/Test Order >> Dec 02, 2024  9:03 AM Viola FALCON wrote: Patient would like CT scan order put in. Please call him at [phone]. He's requesting a call from [facility] or anyone that can help. Dr. Joshua next available appt is 01/04/25 and he says he cannot wait until then.

## 2024-12-08 NOTE — Telephone Encounter (Signed)
Patient has been scheduled to see Dr Yetta Barre

## 2024-12-15 ENCOUNTER — Encounter: Payer: Self-pay | Admitting: Internal Medicine

## 2024-12-15 ENCOUNTER — Ambulatory Visit: Admitting: Internal Medicine

## 2024-12-15 VITALS — BP 114/58 | HR 72 | Temp 98.7°F | Resp 16 | Ht 68.0 in | Wt 158.6 lb

## 2024-12-15 DIAGNOSIS — I959 Hypotension, unspecified: Secondary | ICD-10-CM | POA: Diagnosis not present

## 2024-12-15 DIAGNOSIS — R911 Solitary pulmonary nodule: Secondary | ICD-10-CM

## 2024-12-15 DIAGNOSIS — R7303 Prediabetes: Secondary | ICD-10-CM | POA: Diagnosis not present

## 2024-12-15 DIAGNOSIS — E034 Atrophy of thyroid (acquired): Secondary | ICD-10-CM

## 2024-12-15 DIAGNOSIS — D500 Iron deficiency anemia secondary to blood loss (chronic): Secondary | ICD-10-CM | POA: Diagnosis not present

## 2024-12-15 DIAGNOSIS — K743 Primary biliary cirrhosis: Secondary | ICD-10-CM

## 2024-12-15 LAB — BASIC METABOLIC PANEL WITH GFR
BUN: 27 mg/dL — ABNORMAL HIGH (ref 6–23)
CO2: 32 meq/L (ref 19–32)
Calcium: 9.9 mg/dL (ref 8.4–10.5)
Chloride: 104 meq/L (ref 96–112)
Creatinine, Ser: 0.83 mg/dL (ref 0.40–1.50)
GFR: 85.44 mL/min
Glucose, Bld: 88 mg/dL (ref 70–99)
Potassium: 3.9 meq/L (ref 3.5–5.1)
Sodium: 141 meq/L (ref 135–145)

## 2024-12-15 LAB — CBC WITH DIFFERENTIAL/PLATELET
Basophils Absolute: 0 10*3/uL (ref 0.0–0.1)
Basophils Relative: 0.3 % (ref 0.0–3.0)
Eosinophils Absolute: 0.1 10*3/uL (ref 0.0–0.7)
Eosinophils Relative: 1.1 % (ref 0.0–5.0)
HCT: 40.8 % (ref 39.0–52.0)
Hemoglobin: 13.6 g/dL (ref 13.0–17.0)
Lymphocytes Relative: 20.3 % (ref 12.0–46.0)
Lymphs Abs: 1.4 10*3/uL (ref 0.7–4.0)
MCHC: 33.2 g/dL (ref 30.0–36.0)
MCV: 83.4 fl (ref 78.0–100.0)
Monocytes Absolute: 0.5 10*3/uL (ref 0.1–1.0)
Monocytes Relative: 6.8 % (ref 3.0–12.0)
Neutro Abs: 4.9 10*3/uL (ref 1.4–7.7)
Neutrophils Relative %: 71.5 % (ref 43.0–77.0)
Platelets: 215 10*3/uL (ref 150.0–400.0)
RBC: 4.89 Mil/uL (ref 4.22–5.81)
RDW: 16 % — ABNORMAL HIGH (ref 11.5–15.5)
WBC: 6.8 10*3/uL (ref 4.0–10.5)

## 2024-12-15 LAB — PROTIME-INR
INR: 1.2 ratio — ABNORMAL HIGH (ref 0.8–1.0)
Prothrombin Time: 13.1 s (ref 9.6–13.1)

## 2024-12-15 LAB — HEPATIC FUNCTION PANEL
ALT: 18 U/L (ref 3–53)
AST: 25 U/L (ref 5–37)
Albumin: 4.3 g/dL (ref 3.5–5.2)
Alkaline Phosphatase: 93 U/L (ref 39–117)
Bilirubin, Direct: 0.1 mg/dL (ref 0.1–0.3)
Total Bilirubin: 0.4 mg/dL (ref 0.2–1.2)
Total Protein: 7.9 g/dL (ref 6.0–8.3)

## 2024-12-15 LAB — HEMOGLOBIN A1C: Hgb A1c MFr Bld: 6.1 % (ref 4.6–6.5)

## 2024-12-15 NOTE — Patient Instructions (Signed)

## 2024-12-15 NOTE — Progress Notes (Unsigned)
 "  Subjective:  Patient ID: William JONETTA Forget, William Luna, male    DOB: 07-Aug-1951  Age: 74 y.o. MRN: [id number]  CC: Chest CT Follow up (Patient wants to discuss when he needs to do a chest CT follow up as it was recommended to have repeat imagining in 6 months due to him having a small nodule on his right lung. ), [facility]  (Discuss his results from gene [facility]. ), and Lab Work Only  (Patient wants to discuss possibly rechecking his labs. )   HPI William JONETTA Forget, William Luna presents for f/up ----  Discussed the use of AI scribe software for clinical note transcription with the patient, who gave verbal consent to proceed.  History of Present Illness William JONETTA Forget, William Luna [name] is a 74 year old male who presents for follow-up on genetic testing and other health concerns.  He is here to discuss the results of his genetic testing, which revealed a positive BRCA1 mutation. He is informing his family members, including his brother and sister, due to their children potentially being at risk. He is being monitored with regular PSA tests and examinations for prostate cancer risk. 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  He has a history of a small lung nodule identified on a chest CT in July, which was recommended for follow-up in six to twelve months. It has been seven months since the initial finding, and he is seeking to schedule a follow-up CT scan. He does not believe the BRCA1 mutation affects his lung cancer risk.  He is monitoring his potassium levels and continues potassium supplementation. His hemoglobin and iron levels have improved, although his iron saturation is not yet optimal. He is interested in checking his current labs, including TSH, which was slightly high on the last test.  He feels generally well, without any major symptoms such as headaches, nausea, vomiting, or diarrhea. He notes a lower activity  level recently, attributing it to the winter season rather than any health issues. He recently attended a cardiology meeting and skied without any problems, despite poor snow conditions.  He denies any dizziness or lightheadedness. He suspects he might be slightly dehydrated, as he tends to drink more fluids later in the day.  1) I need Dr Joshua to schedule a chest CT with contrast for me as I mentioned in the beginning of this messaging today. I will discuss with him at the visit. He can refer to the CT I had 06/09/24 (It is in my tests in [facility]).  If he prefers, I am happy to have the CT before the visit.  2) I have just learned that my genetic tests show that I am BRCA1 positive. I want to discuss with him.  3) He and I need to decide if I need follow up labs following my anemia.   Outpatient Medications Prior to Visit  Medication Sig Dispense Refill   atorvastatin  (LIPITOR) 20 MG tablet TAKE 1 TABLET DAILY 90 tablet 3   cetirizine (ZYRTEC) 10 MG tablet Take 10 mg by mouth daily.     levothyroxine  (SYNTHROID ) 112 MCG tablet TAKE 1 TABLET DAILY 90 tablet 3   polyethylene glycol powder (GLYCOLAX/MIRALAX) 17 GM/SCOOP powder Take 1 Container by mouth once.     sertraline  (ZOLOFT ) 50 MG tablet TAKE 1 TABLET TWICE A DAY 180 tablet 3   ursodiol  (ACTIGALL ) 250 MG tablet TAKE 4 TABLETS BY MOUTH  DAILY 360 tablet 1   zaleplon  (SONATA ) 10 MG capsule Take 1 capsule (10 mg total) by mouth at bedtime as needed for sleep. 50 capsule 0   Ferric Maltol  (ACCRUFER ) 30 MG CAPS Take 1 capsule (30 mg total) by mouth in the morning and at bedtime. 180 capsule 0   No facility-administered medications prior to visit.    ROS Review of Systems  Constitutional:  Negative for appetite change, chills, diaphoresis, fatigue and fever.  HENT: Negative.    Eyes: Negative.   Respiratory: Negative.  Negative for cough, chest tightness, shortness of breath and wheezing.   Cardiovascular:  Negative for chest pain,  palpitations and leg swelling.  Gastrointestinal: Negative.  Negative for abdominal pain, constipation, diarrhea, nausea and vomiting.  Endocrine: Negative.   Genitourinary: Negative.  Negative for difficulty urinating and dysuria.  Musculoskeletal: Negative.  Negative for arthralgias and myalgias.  Skin: Negative.   Neurological: Negative.  Negative for dizziness, syncope, weakness and light-headedness.  Hematological:  Negative for adenopathy. Does not bruise/bleed easily.  Psychiatric/Behavioral: Negative.      Objective:  BP (!) 114/58 (BP Location: Left Arm, Patient Position: Sitting, Cuff Size: Normal)   Pulse 72   Temp 98.7 F (37.1 C) (Temporal)   Resp 16   Ht 5' 8 (1.727 m)   Wt 158 lb 9.6 oz (71.9 kg)   SpO2 97%   BMI 24.12 kg/m   BP Readings from Last 3 Encounters:  12/15/24 (!) 114/58  09/28/24 128/70  08/23/24 128/72    Wt Readings from Last 3 Encounters:  12/15/24 158 lb 9.6 oz (71.9 kg)  09/28/24 157 lb (71.2 kg)  08/23/24 153 lb (69.4 kg)    Physical Exam Vitals reviewed.  Constitutional:      Appearance: Normal appearance.  HENT:     Nose: Nose normal.     Mouth/Throat:     Mouth: Mucous membranes are moist.  Eyes:     General: No scleral icterus.    Conjunctiva/sclera: Conjunctivae normal.  Cardiovascular:     Rate and Rhythm: Normal rate and regular rhythm.     Heart sounds: No murmur heard.    No friction rub. No gallop.     Comments: EKG- NSR, 69 bpm No LVH, Q waves, or ST/T wave changes  Pulmonary:     Effort: Pulmonary effort is normal.     Breath sounds: No stridor. No wheezing, rhonchi or rales.  Abdominal:     General: Abdomen is flat.     Palpations: There is no mass.     Tenderness: There is no abdominal tenderness. There is no guarding.     Hernia: No hernia is present.  Musculoskeletal:        General: Normal range of motion.     Cervical back: Neck supple.     Right lower leg: No edema.     Left lower leg: No edema.   Lymphadenopathy:     Cervical: No cervical adenopathy.  Skin:    General: Skin is warm and dry.  Neurological:     Mental Status: He is alert. Mental status is at baseline.  Psychiatric:        Mood and Affect: Mood normal.        Behavior: Behavior normal.     Lab Results  Component Value Date   WBC 6.8 12/15/2024   HGB 13.6 12/15/2024   HCT 40.8 12/15/2024   PLT 215.0 12/15/2024   GLUCOSE 88 12/15/2024   CHOL 125 07/01/2024  TRIG 67.0 07/01/2024   HDL 42.10 07/01/2024   LDLDIRECT 134.5 12/15/2006   LDLCALC 70 07/01/2024   ALT 18 12/15/2024   AST 25 12/15/2024   NA 141 12/15/2024   K 3.9 12/15/2024   CL 104 12/15/2024   CREATININE 0.83 12/15/2024   BUN 27 (H) 12/15/2024   CO2 32 12/15/2024   TSH 3.14 12/15/2024   PSA 0.9 02/25/2024   INR 1.2 (H) 12/15/2024   HGBA1C 6.1 12/15/2024    VAS US  LOWER EXTREMITY VENOUS REFLUX Result Date: 08/23/2024  Lower Venous Reflux Study Patient Name:  ANTWON ROCHIN  Date of Exam:   08/23/2024 Medical Rec #: [id number]       Accession #:    [id number] Date of Birth: 04/24/1951        Patient Gender: M Patient Age:   83 years Exam Location:  [address] Procedure:      VAS US  LOWER EXTREMITY VENOUS REFLUX Referring Phys: GAILE NEW --------------------------------------------------------------------------------  Indications: VV of lower extremity with pain.  Comparison Study: N/A Performing Technologist: Dena Pane  Examination Guidelines: A complete evaluation includes B-mode imaging, spectral Doppler, color Doppler, and power Doppler as needed of all accessible portions of each vessel. Bilateral testing is considered an integral part of a complete examination. Limited examinations for reoccurring indications may be performed as noted. The reflux portion of the exam is performed with the patient in reverse Trendelenburg. Significant venous reflux is defined as >500 ms in the superficial venous system, and >1 second in the deep venous  system.  Venous Reflux Times +--------------+---------+------+-----------+------------+---------+ RIGHT         Reflux NoRefluxReflux TimeDiameter cmsComments                          Yes                                   +--------------+---------+------+-----------+------------+---------+ CFV           no                                              +--------------+---------+------+-----------+------------+---------+ FV prox       no                                              +--------------+---------+------+-----------+------------+---------+ FV mid        no                                              +--------------+---------+------+-----------+------------+---------+ FV dist                 yes   >1 second                       +--------------+---------+------+-----------+------------+---------+ Popliteal     no                                              +--------------+---------+------+-----------+------------+---------+  GSV at [facility]    no                            0.66              +--------------+---------+------+-----------+------------+---------+ GSV prox thigh          yes    >500 ms      0.26              +--------------+---------+------+-----------+------------+---------+ GSV mid thigh           yes    >500 ms      0.26              +--------------+---------+------+-----------+------------+---------+ GSV dist thighno                            0.25              +--------------+---------+------+-----------+------------+---------+ GSV at knee             yes    >500 ms      0.13              +--------------+---------+------+-----------+------------+---------+ GSV prox calf no                            0.19              +--------------+---------+------+-----------+------------+---------+ GSV mid calf            yes    >500 ms      0.17    tributary  +--------------+---------+------+-----------+------------+---------+ GSV dist calf no                            0.16    tributary +--------------+---------+------+-----------+------------+---------+ SSV at [facility]    no                            0.54              +--------------+---------+------+-----------+------------+---------+ SSV prox calf           yes    >500 ms      0.27              +--------------+---------+------+-----------+------------+---------+ SSV mid calf  no                            0.12              +--------------+---------+------+-----------+------------+---------+  Summary: Right: - No evidence of deep vein thrombosis seen in the right lower extremity, from the common femoral through the popliteal veins. - No evidence of superficial venous thrombosis in the right lower extremity.  -Please see noted reflux above. 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 *See table(s) above for measurements and observations. Electronically signed by Gaile New William Luna on 08/23/2024 at 3:12:20 PM.    Final    MELD 3.0: 8 at 12/15/2024  4:17 PM MELD-Na: 8 at 12/15/2024  4:17 PM Calculated from: Serum Creatinine: 0.83 mg/dL (Using min of 1 mg/dL) at 8/71/7973  5:82 PM Serum Sodium: 141 mEq/L (Using max of 137 mEq/L) at 12/15/2024  4:17 PM Total Bilirubin: 0.4 mg/dL (Using min of 1 mg/dL) at 8/71/7973  5:82 PM  Serum Albumin: 4.3 g/dL (Using max of 3.5 g/dL) at 8/71/7973  5:82 PM INR(ratio): 1.2 ratio at 12/15/2024  4:17 PM Age at listing (hypothetical): 73 years Sex: Male at 12/15/2024  4:17 PM    Assessment & Plan:   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 atrophy of thyroid - He is euthyroid. -     TSH; Future  Primary biliary cholangitis (HCC) -     Hepatic function panel; Future -     Protime-INR; Future  Right lower lobe pulmonary nodule -     CT CHEST W CONTRAST; Future  Prediabetes -     Basic metabolic panel with GFR;  Future -     Hemoglobin A1c; Future  Iron deficiency anemia due to chronic blood loss -     IBC + Ferritin; Future -     CBC with Differential/Platelet; Future -     ACCRUFeR ; Take 1 capsule (30 mg total) by mouth in the morning and at bedtime.  Dispense: 180 capsule; Refill: 0  Hypotension, unspecified hypotension type- Exam, EKG, labs are reassuring. -     EKG 12-Lead -     Cortisol; Future     Follow-up: Return in about 3 months (around 03/15/2025).  Debby Molt, William Luna "

## 2024-12-16 ENCOUNTER — Ambulatory Visit: Payer: Self-pay | Admitting: Internal Medicine

## 2024-12-16 LAB — IBC + FERRITIN
Ferritin: 46.5 ng/mL (ref 22.0–322.0)
Iron: 67 ug/dL (ref 42–165)
Saturation Ratios: 17.3 % — ABNORMAL LOW (ref 20.0–50.0)
TIBC: 386.4 ug/dL (ref 250.0–450.0)
Transferrin: 276 mg/dL (ref 212.0–360.0)

## 2024-12-16 LAB — TSH: TSH: 3.14 u[IU]/mL (ref 0.35–5.50)

## 2024-12-16 LAB — CORTISOL: Cortisol, Plasma: 7.1 ug/dL

## 2024-12-16 MED ORDER — ACCRUFER 30 MG PO CAPS: 1.0000 | ORAL_CAPSULE | Freq: Two times a day (BID) | ORAL | 0 refills | Status: AC

## 2024-12-23 ENCOUNTER — Other Ambulatory Visit: Payer: Self-pay | Admitting: Internal Medicine

## 2024-12-23 DIAGNOSIS — K743 Primary biliary cirrhosis: Secondary | ICD-10-CM

## 2024-12-27 ENCOUNTER — Inpatient Hospital Stay: Admission: RE | Admit: 2024-12-27 | Source: Ambulatory Visit

## 2025-05-06 ENCOUNTER — Ambulatory Visit
